# Patient Record
Sex: Female | Born: 1965 | Race: White | Hispanic: No | Marital: Married | Smoking: Never smoker
Health system: Southern US, Community
[De-identification: ages and names within clinical notes are randomized; demographics above are authoritative.]

## PROBLEM LIST (undated history)

## (undated) DIAGNOSIS — M109 Gout, unspecified: Secondary | ICD-10-CM

## (undated) DIAGNOSIS — C499 Malignant neoplasm of connective and soft tissue, unspecified: Secondary | ICD-10-CM

## (undated) DIAGNOSIS — M5430 Sciatica, unspecified side: Secondary | ICD-10-CM

## (undated) DIAGNOSIS — G8929 Other chronic pain: Secondary | ICD-10-CM

## (undated) DIAGNOSIS — E119 Type 2 diabetes mellitus without complications: Secondary | ICD-10-CM

## (undated) DIAGNOSIS — Z8669 Personal history of other diseases of the nervous system and sense organs: Secondary | ICD-10-CM

## (undated) DIAGNOSIS — D649 Anemia, unspecified: Secondary | ICD-10-CM

## (undated) DIAGNOSIS — G4730 Sleep apnea, unspecified: Secondary | ICD-10-CM

## (undated) DIAGNOSIS — O2230 Deep phlebothrombosis in pregnancy, unspecified trimester: Secondary | ICD-10-CM

## (undated) DIAGNOSIS — N200 Calculus of kidney: Secondary | ICD-10-CM

## (undated) DIAGNOSIS — Z87442 Personal history of urinary calculi: Secondary | ICD-10-CM

## (undated) DIAGNOSIS — R51 Headache: Secondary | ICD-10-CM

## (undated) DIAGNOSIS — R519 Headache, unspecified: Secondary | ICD-10-CM

## (undated) DIAGNOSIS — Z973 Presence of spectacles and contact lenses: Secondary | ICD-10-CM

## (undated) DIAGNOSIS — Z9989 Dependence on other enabling machines and devices: Secondary | ICD-10-CM

## (undated) DIAGNOSIS — F419 Anxiety disorder, unspecified: Secondary | ICD-10-CM

## (undated) DIAGNOSIS — G4733 Obstructive sleep apnea (adult) (pediatric): Secondary | ICD-10-CM

## (undated) DIAGNOSIS — O99019 Anemia complicating pregnancy, unspecified trimester: Secondary | ICD-10-CM

## (undated) DIAGNOSIS — IMO0002 Reserved for concepts with insufficient information to code with codable children: Secondary | ICD-10-CM

## (undated) DIAGNOSIS — M549 Dorsalgia, unspecified: Secondary | ICD-10-CM

## (undated) HISTORY — PX: CARDIAC CATHETERIZATION: SHX172

## (undated) HISTORY — PX: KNEE SURGERY: SHX244

## (undated) HISTORY — PX: ABDOMINAL HYSTERECTOMY: SHX81

## (undated) HISTORY — PX: ROTATOR CUFF REPAIR: SHX139

---

## 1998-06-02 ENCOUNTER — Emergency Department (HOSPITAL_COMMUNITY): Admission: EM | Admit: 1998-06-02 | Discharge: 1998-06-02 | Payer: Self-pay | Admitting: Emergency Medicine

## 1998-06-03 ENCOUNTER — Encounter: Payer: Self-pay | Admitting: Neurosurgery

## 1998-06-03 ENCOUNTER — Ambulatory Visit (HOSPITAL_COMMUNITY): Admission: RE | Admit: 1998-06-03 | Discharge: 1998-06-03 | Payer: Self-pay | Admitting: Neurosurgery

## 1998-08-03 ENCOUNTER — Ambulatory Visit (HOSPITAL_COMMUNITY): Admission: RE | Admit: 1998-08-03 | Discharge: 1998-08-03 | Payer: Self-pay | Admitting: Neurosurgery

## 1998-08-03 ENCOUNTER — Encounter: Payer: Self-pay | Admitting: Neurosurgery

## 1998-08-17 ENCOUNTER — Encounter: Payer: Self-pay | Admitting: Neurosurgery

## 1998-08-17 ENCOUNTER — Ambulatory Visit (HOSPITAL_COMMUNITY): Admission: RE | Admit: 1998-08-17 | Discharge: 1998-08-17 | Payer: Self-pay | Admitting: Neurosurgery

## 1998-08-31 ENCOUNTER — Encounter: Payer: Self-pay | Admitting: Neurosurgery

## 1998-08-31 ENCOUNTER — Ambulatory Visit (HOSPITAL_COMMUNITY): Admission: RE | Admit: 1998-08-31 | Discharge: 1998-08-31 | Payer: Self-pay | Admitting: Neurosurgery

## 1998-11-10 ENCOUNTER — Encounter: Payer: Self-pay | Admitting: Neurosurgery

## 1998-11-10 ENCOUNTER — Ambulatory Visit (HOSPITAL_COMMUNITY): Admission: RE | Admit: 1998-11-10 | Discharge: 1998-11-10 | Payer: Self-pay | Admitting: Neurosurgery

## 2004-08-09 ENCOUNTER — Ambulatory Visit: Payer: Self-pay | Admitting: General Practice

## 2005-06-15 ENCOUNTER — Ambulatory Visit: Payer: Self-pay | Admitting: Orthopaedic Surgery

## 2006-08-15 ENCOUNTER — Ambulatory Visit: Payer: Self-pay | Admitting: Endocrinology

## 2006-09-17 ENCOUNTER — Emergency Department: Payer: Self-pay | Admitting: Emergency Medicine

## 2006-12-27 ENCOUNTER — Ambulatory Visit: Payer: Self-pay | Admitting: Internal Medicine

## 2007-02-11 ENCOUNTER — Emergency Department: Payer: Self-pay | Admitting: Internal Medicine

## 2007-11-20 ENCOUNTER — Ambulatory Visit: Payer: Self-pay | Admitting: Endocrinology

## 2008-07-28 ENCOUNTER — Emergency Department: Payer: Self-pay | Admitting: Emergency Medicine

## 2009-01-19 ENCOUNTER — Ambulatory Visit: Payer: Self-pay | Admitting: Internal Medicine

## 2010-06-20 ENCOUNTER — Emergency Department: Payer: Self-pay | Admitting: Emergency Medicine

## 2010-12-14 ENCOUNTER — Ambulatory Visit: Payer: Self-pay | Admitting: Specialist

## 2011-01-19 ENCOUNTER — Ambulatory Visit: Payer: Self-pay | Admitting: Cardiology

## 2011-12-23 ENCOUNTER — Ambulatory Visit: Payer: Self-pay | Admitting: Family Medicine

## 2012-01-31 ENCOUNTER — Other Ambulatory Visit: Payer: Self-pay | Admitting: Orthopedic Surgery

## 2012-01-31 LAB — SYNOVIAL CELL COUNT + DIFF, W/ CRYSTALS
Basophil: 0 %
Lymphocytes: 15 %
Neutrophils: 78 %
Nucleated Cell Count: 3971 /mm3

## 2012-02-04 LAB — BODY FLUID CULTURE

## 2012-02-29 HISTORY — PX: BACK SURGERY: SHX140

## 2012-03-08 ENCOUNTER — Ambulatory Visit: Payer: Self-pay | Admitting: Orthopedic Surgery

## 2012-03-27 ENCOUNTER — Ambulatory Visit: Payer: Self-pay | Admitting: Orthopedic Surgery

## 2012-03-27 DIAGNOSIS — R9431 Abnormal electrocardiogram [ECG] [EKG]: Secondary | ICD-10-CM

## 2012-03-27 LAB — APTT: Activated PTT: 32.2 secs (ref 23.6–35.9)

## 2012-03-27 LAB — PROTIME-INR: Prothrombin Time: 13 secs (ref 11.5–14.7)

## 2012-04-02 ENCOUNTER — Ambulatory Visit: Payer: Self-pay | Admitting: Orthopedic Surgery

## 2012-09-28 ENCOUNTER — Ambulatory Visit: Payer: Self-pay | Admitting: Physical Medicine and Rehabilitation

## 2012-10-04 ENCOUNTER — Ambulatory Visit: Payer: Self-pay | Admitting: Pain Medicine

## 2012-10-12 ENCOUNTER — Other Ambulatory Visit: Payer: Self-pay | Admitting: Pain Medicine

## 2012-10-17 ENCOUNTER — Ambulatory Visit: Payer: Self-pay | Admitting: Pain Medicine

## 2012-10-18 ENCOUNTER — Ambulatory Visit: Payer: Self-pay | Admitting: Pain Medicine

## 2012-11-01 ENCOUNTER — Ambulatory Visit: Payer: Self-pay | Admitting: Pain Medicine

## 2012-11-26 ENCOUNTER — Ambulatory Visit: Payer: Self-pay | Admitting: Pain Medicine

## 2012-12-03 ENCOUNTER — Encounter: Payer: Self-pay | Admitting: Pain Medicine

## 2012-12-29 ENCOUNTER — Encounter: Payer: Self-pay | Admitting: Pain Medicine

## 2013-01-28 ENCOUNTER — Encounter: Payer: Self-pay | Admitting: Pain Medicine

## 2013-05-27 ENCOUNTER — Emergency Department: Payer: Self-pay | Admitting: Emergency Medicine

## 2013-08-25 ENCOUNTER — Emergency Department: Payer: Self-pay | Admitting: Emergency Medicine

## 2013-08-27 ENCOUNTER — Emergency Department: Payer: Self-pay | Admitting: Emergency Medicine

## 2013-08-27 LAB — BASIC METABOLIC PANEL
Anion Gap: 5 — ABNORMAL LOW (ref 7–16)
BUN: 11 mg/dL (ref 7–18)
CALCIUM: 8.8 mg/dL (ref 8.5–10.1)
CHLORIDE: 107 mmol/L (ref 98–107)
Co2: 26 mmol/L (ref 21–32)
Creatinine: 1.01 mg/dL (ref 0.60–1.30)
EGFR (African American): >60
EGFR (Non-African Amer.): >60
Glucose: 218 mg/dL — ABNORMAL HIGH (ref 65–99)
Osmolality: 282 (ref 275–301)
Potassium: 4.1 mmol/L (ref 3.5–5.1)
Sodium: 138 mmol/L (ref 136–145)

## 2013-08-27 LAB — CBC WITH DIFFERENTIAL/PLATELET
BASOS PCT: 0.4 %
Basophil #: 0 10*3/uL (ref 0.0–0.1)
Comment - H1-Com1: NORMAL
Comment - H1-Com2: NORMAL
EOS ABS: 0.1 10*3/uL (ref 0.0–0.7)
Eosinophil %: 1.6 %
HCT: 41.4 % (ref 35.0–47.0)
HGB: 13.5 g/dL (ref 12.0–16.0)
LYMPHS PCT: 26.8 %
Lymphocyte #: 2.3 10*3/uL (ref 1.0–3.6)
Lymphocytes: 24 %
MCH: 28.8 pg (ref 26.0–34.0)
MCHC: 32.7 g/dL (ref 32.0–36.0)
MCV: 88 fL (ref 80–100)
MONOS PCT: 5.7 %
Monocyte #: 0.5 x10 3/mm (ref 0.2–0.9)
Monocytes: 6 %
NEUTROS ABS: 5.7 10*3/uL (ref 1.4–6.5)
NEUTROS PCT: 65.5 %
Platelet: 255 10*3/uL (ref 150–440)
RBC: 4.7 10*6/uL (ref 3.80–5.20)
RDW: 13.2 % (ref 11.5–14.5)
Segmented Neutrophils: 70 %
WBC: 8.6 10*3/uL (ref 3.6–11.0)

## 2013-08-27 LAB — SEDIMENTATION RATE: ERYTHROCYTE SED RATE: 11 mm/h (ref 0–30)

## 2013-08-27 LAB — URIC ACID: Uric Acid: 9.9 mg/dL — ABNORMAL HIGH (ref 2.6–6.0)

## 2013-09-10 ENCOUNTER — Emergency Department: Payer: Self-pay | Admitting: Emergency Medicine

## 2013-09-30 ENCOUNTER — Emergency Department (HOSPITAL_COMMUNITY): Payer: Self-pay

## 2013-09-30 ENCOUNTER — Encounter (HOSPITAL_COMMUNITY): Payer: Self-pay | Admitting: Emergency Medicine

## 2013-09-30 ENCOUNTER — Emergency Department (HOSPITAL_COMMUNITY)
Admission: EM | Admit: 2013-09-30 | Discharge: 2013-09-30 | Disposition: A | Discharge status: Home / Self Care | Payer: BC Managed Care – PPO | Attending: Emergency Medicine | Admitting: Emergency Medicine

## 2013-09-30 ENCOUNTER — Emergency Department (HOSPITAL_COMMUNITY): Payer: BC Managed Care – PPO

## 2013-09-30 DIAGNOSIS — G8929 Other chronic pain: Secondary | ICD-10-CM | POA: Insufficient documentation

## 2013-09-30 DIAGNOSIS — E119 Type 2 diabetes mellitus without complications: Secondary | ICD-10-CM | POA: Insufficient documentation

## 2013-09-30 DIAGNOSIS — M7989 Other specified soft tissue disorders: Secondary | ICD-10-CM | POA: Insufficient documentation

## 2013-09-30 DIAGNOSIS — Z79899 Other long term (current) drug therapy: Secondary | ICD-10-CM | POA: Insufficient documentation

## 2013-09-30 DIAGNOSIS — M79609 Pain in unspecified limb: Secondary | ICD-10-CM | POA: Insufficient documentation

## 2013-09-30 DIAGNOSIS — M5137 Other intervertebral disc degeneration, lumbosacral region: Secondary | ICD-10-CM | POA: Insufficient documentation

## 2013-09-30 DIAGNOSIS — M51379 Other intervertebral disc degeneration, lumbosacral region without mention of lumbar back pain or lower extremity pain: Secondary | ICD-10-CM | POA: Insufficient documentation

## 2013-09-30 DIAGNOSIS — M79604 Pain in right leg: Secondary | ICD-10-CM

## 2013-09-30 DIAGNOSIS — M5136 Other intervertebral disc degeneration, lumbar region: Secondary | ICD-10-CM

## 2013-09-30 HISTORY — DX: Sciatica, unspecified side: M54.30

## 2013-09-30 HISTORY — DX: Dorsalgia, unspecified: M54.9

## 2013-09-30 HISTORY — DX: Gout, unspecified: M10.9

## 2013-09-30 HISTORY — DX: Other chronic pain: G89.29

## 2013-09-30 HISTORY — DX: Sleep apnea, unspecified: G47.30

## 2013-09-30 HISTORY — DX: Type 2 diabetes mellitus without complications: E11.9

## 2013-09-30 MED ORDER — OXYCODONE-ACETAMINOPHEN 5-325 MG PO TABS
2.0000 | ORAL_TABLET | Freq: Once | ORAL | Status: AC
  Administered 2013-09-30: 2 via ORAL
  Filled 2013-09-30: qty 2

## 2013-09-30 MED ORDER — METHOCARBAMOL 500 MG PO TABS: 1000.0000 mg | ORAL_TABLET | Freq: Four times a day (QID) | ORAL | Status: DC | PRN

## 2013-09-30 NOTE — Discharge Instructions (Signed)
°Emergency Department Resource Guide °1) Find a Doctor and Pay Out of Pocket °Although you won't have to find out who is covered by your insurance plan, it is a good idea to ask around and get recommendations. You will then need to call the office and see if the doctor you have chosen will accept you as a new patient and what types of options they offer for patients who are self-pay. Some doctors offer discounts or will set up payment plans for their patients who do not have insurance, but you will need to ask so you aren't surprised when you get to your appointment. ° °2) Contact Your Local Health Department °Not all health departments have doctors that can see patients for sick visits, but many do, so it is worth a call to see if yours does. If you don't know where your local health department is, you can check in your phone book. The CDC also has a tool to help you locate your state's health department, and many state websites also have listings of all of their local health departments. ° °3) Find a Walk-in Clinic °If your illness is not likely to be very severe or complicated, you Resetar want to try a walk in clinic. These are popping up all over the country in pharmacies, drugstores, and shopping centers. They're usually staffed by nurse practitioners or physician assistants that have been trained to treat common illnesses and complaints. They're usually fairly quick and inexpensive. However, if you have serious medical issues or chronic medical problems, these are probably not your best option. ° °No Primary Care Doctor: °- Call [facility] at  832-8000 - they can help you locate a primary care doctor that  accepts your insurance, provides certain services, etc. °- Physician Referral Service- 1-800-533-3463 ° °Chronic Pain Problems: °Organization         Address  Phone   Notes  °[facility]  ([phone]) [phone] Patients need to be referred by their primary care doctor.  ° °Medication  Assistance: °Organization         Address  Phone   Notes  °[place] [facility] [address] °[place], [place] [address] (336) 641-8030 --Must be a resident of [place] °-- Must have NO insurance coverage whatsoever (no Medicaid/ Medicare, etc.) °-- The pt. MUST have a primary care doctor that directs their care regularly and follows them in the community °  °[facility]  (866) 331-1348   °[employer]  (888) 892-1162   ° °Agencies that provide inexpensive medical care: °Organization         Address  Phone   Notes  °[facility]  (336) 832-8035   °[facility]    (336) 832-7272   °[facility] [address] °[place], [place] [address] (336) 832-4777   °[facility] [address], °[place] (336) 271-4999   °[facility]    (336) 373-0678   °[facility]    (336) 272-1050   °[facility] ° [address], [place] Phone:  (336) 832-4444, Fax:  (336) 832-4440 Hours of Operation:  9 am - 6 pm, M-F.  Also accepts Medicaid/Medicare and self-pay.  °[facility] ° [address], [place] Phone: (336) 832-3150, Fax: (336) [phone]. Hours of Operation:  8:30 am - 5:30 pm, M-F.  Also accepts Medicaid and self-pay.  °[facility] [place] [address], [place] Phone: (336) 878-6027   °[facility] [address], [place], [place] (336)723-1848, Ext. 123 Mondays & Thursdays: 7-9 AM.  First 15 patients are seen on a first come, first serve basis. °  ° °Medicaid-accepting [place] Providers: ° °Organization         Address  Phone   Notes  °[facility] [address], [place] (336) 641-2100 Also accepts self-pay patients.  °[facility] [address], [place] ° (336) 856-9996   °[facility] [address], [place]  (336) 288-8857   °[facility] [address] [place] [address], [place] (336) 299-7000   °Veita Bland [address], [place]  ° (336) 373-1557 Only accepts [place] Access Medicaid patients after they have their name applied to their card.  ° °Self-Pay (no insurance) in [place]: ° °Organization         Address  Phone   Notes  °Sickle Cell Patients, [facility] [address], [place] (336) 832-1970   °[facility] [address], [place] (336) 832-4400   °[facility] ° [address], [place] (336) 992-4800   °[facility]/Dr. Osei-Bonsu ° [address] [place] [address], [place] or [address], [place] (336) [phone] Phone number for both [place] and [place] locations is the same.  °[facility] [address], [place] (336) 299-0000   °[facility] [address] [place] [address], [place] or [address] (336) 852-7530 °(336) 878-2260   °[facility] [address], [place] (336) 350-1642, phone; (336) 294-5005, fax Sees patients 1st and 3rd Saturday of every month.  Must not qualify for public or private insurance (i.e. Medicaid, Medicare, [place] Health Choice, Veterans' Benefits) • Household income should be no more than 200% of the poverty level •The clinic cannot treat you if you are pregnant or think you are pregnant • Sexually transmitted diseases are not treated at the clinic.  ° ° °Dental Care: °Organization         Address  Phone  Notes  °[place] [facility] [address], [place] (336) 641-6152 Accepts children up to age 21 who are enrolled in Medicaid or [place] Health Choice; pregnant women with a Medicaid card; and children who have applied for Medicaid or [place] Health Choice, but were declined, whose parents can pay a reduced fee at time of service.  °[place]  [facility] [place]  [address], [place] (336) 641-7733 Accepts children up to age 21 who are enrolled in Medicaid or [place] Health Choice; pregnant women with a Medicaid card; and children who have applied for Medicaid or  Health Choice, but were declined, whose parents can pay a reduced fee at time of service.  °[facility] ° [address], [place] (336) 641-4533 Patients are seen by appointment only. Walk-ins are not accepted. [facility] will see patients 18 years of age and older. °Monday - Tuesday (8am-5pm) °Most Wednesdays (8:30-5pm) °$30 per visit, cash only  °[facility] ° [address], [place] (336) [phone] Patients are seen by appointment only. Walk-ins are not accepted. [facility] will see patients 18 years of age and older. °One   Wednesday Evening (Monthly: Volunteer Based).  $30 per visit, cash only  °[facility]  (919) [phone] for adults; Children under age 4, call Graduate Pediatric Dentistry at ([phone]) [phone]. Children aged 4-14, please call ([phone]) [phone] to request a pediatric application. ° Dental services are provided in all areas of dental care including fillings, crowns and bridges, complete and partial dentures, implants, gum treatment, root canals, and extractions. Preventive care is also provided. Treatment is provided to both adults and children. °Patients are selected via a lottery and there is often a waiting list. °  °[facility] [address], °[place] ° (336) 763-8833 [email or web address] °  °[facility] [address], [place], [place] (336)723-1848, Ext. 123 Second and Fourth Thursday of each month, opens at 6:30 AM; Clinic ends at 9 AM.  Patients are seen on a first-come first-served basis, and a limited number are seen during each clinic.  ° °[facility] ° [address], [place], [place] (336) [phone]    Eligibility Requirements °You must have lived in [place], or [place] counties for at least the last three months. °  You cannot be eligible for state or federal sponsored healthcare insurance, including Veterans Administration, Medicaid, or Medicare. °  You generally cannot be eligible for healthcare insurance through your employer.  °  How to apply: °Eligibility screenings are held every Tuesday and Wednesday afternoon from 1:00 pm until 4:00 pm. You do not need an appointment for the interview!  °[facility] [address], [place], [place] 336-631-2330   °[facility] [facility]  336-342-8273   °[facility]  336-703-3100   °[facility]  336-570-6415   ° °Behavioral Health Resources in the Community: °Intensive Outpatient Programs °Organization         Address  Phone  Notes  °[facility] [address], [place], [place] 336-878-6098   °[facility] [address], [place], [place] 336-832-9800   °[facility] [address], [place], [place] ° 336-882-2125   °[facility] [address],  °[place], [place] 1-800-853-5163 or 336-641-4981   °Substance Abuse Resources °Organization         Address  Phone  Notes  °[facility]  336-882-2125   °[facility]  336-784-9470   °[facility]  336-285-9073   °[facility]  336-845-3988   °Residential & Outpatient Substance Abuse Program  1-800-659-3381   °Psychological Services °Organization         Address  Phone  Notes  °[facility]  336- 832-9600   °[facility]  336- 378-7881   °[facility] [address], [place] 1-800-853-5163 or 336-641-4981   ° °Mobile Crisis Teams °Organization         Address  Phone  Notes  °[facility], Mobile Crisis Care Unit  1-877-626-1772   °Assertive °[facility] ° [address]  [place], [place] 336-834-9664   °Sharon DeEsch [address] °[place] 336-554-5454   ° °Self-Help/Support Groups °Organization         Address  Phone             Notes  °[facility] [place] - variety of support groups  336- 373-1402 Call for more information  °Narcotics Anonymous (NA), [facility] [address], °[place]   2 meetings at this location  ° °  Residential Treatment Programs Organization         Address  Phone  Notes  [facility] [address],    [place]  1-857-753-3820   [facility]  [address], [place] [address], [place], [place]   [place], [place] [phone] Admissions: 8am-3pm M-F  [facility] [place] [address]    [place], [place] [phone]   [facility] [address] [place], [place], [place]   [facility] [address],  [place], [place]   [facility] Intensive Outpatient [address], [place], [place]   [facility] [address]  [place], [place] 1-367-746-5419 or 470-443-0680   [facility] [address], [place], [place] Accepts Medicaid  [facility] [place] [address],  [place] 1-682-217-9461 Substance Abuse/Addiction Treatment   [facility]         Address  Phone  Notes  [facility]  915-313-0467   Domenic Schwab, PhD [address], [place]   787-423-0714 or 240-150-5183   [place], [place] [phone]   [facility] [address], [place], [place] 719-427-0370 Insurance/Medicaid/sponsorship through [facility] and Families [address]                                    [place], [place] [phone] [place] [address], [place] [phone]    Dr. Adele Schilder  [phone]   [facility] Dept. 1) [address], [place] 2) [place] 3)  [address], [place] ([phone]) [phone]  [phone]   [place] 709-873-3024 or (820)[phone] (After Hours)       Take the prescription as directed.  Apply moist heat or ice to the area(s) of discomfort, for 15 minutes at a time, several times per day for the next few days.  Do not fall asleep on a heating or ice pack.  Call your regular medical doctor or your Neurosurgeon tomorrow to schedule a follow up appointment in the next 2 days.  Return to the Emergency Department immediately if worsening.

## 2013-09-30 NOTE — ED Notes (Signed)
MD at bedside. 

## 2013-09-30 NOTE — ED Notes (Signed)
Patient c/o right leg pain with swelling x2 weeks. Patient reports calling doctor and getting prescription for Vicodin 5mg /325mg . Per patient no relief with medication. Patient reports going to [facility] this morning and PCP feels it could be a DVT.

## 2013-09-30 NOTE — ED Notes (Signed)
PT c/o back of right calf pain and swelling to leg/foot.

## 2013-09-30 NOTE — ED Provider Notes (Signed)
CSN: [id number]     Arrival date & time 09/30/13  1226 History   First MD Initiated Contact with Patient 09/30/13 1329     Chief Complaint  Patient presents with  . Leg Pain  . Leg Swelling      HPI Pt was seen at 1640. Per pt, c/o gradual onset and persistence of constant RLE "pain" for the past 2 to 3 weeks. Pain is located in her posterior right thigh and radiates down her leg. Pain worsens with palpation of the area and body position changes. States she called her Neurosurgeon Dr. Vertell Limber and was rx vicodin without improvement in her pain. Denies incont/retention of bowel or bladder, no saddle anesthesia, no focal motor weakness, no tingling/numbness in extremities, no fevers, no injury, no abd pain.  The symptoms have been associated with no other complaints.     Past Medical History  Diagnosis Date  . Diabetes mellitus without complication   . Chronic back pain   . Gout   . Sleep apnea   . Sciatic pain    Past Surgical History  Procedure Laterality Date  . Back surgery    . Knee surgery    . Cesarean section    . Abdominal hysterectomy     Family History  Problem Relation Age of Onset  . Thyroid disease Mother   . COPD Mother   . Heart attack Father   . Hypertension Brother   . Diabetes Brother    History  Substance Use Topics  . Smoking status: Never Smoker   . Smokeless tobacco: Never Used  . Alcohol Use: No   OB History   Grav Para Term Preterm Abortions TAB SAB Ect Mult Living   3 3 3       3      Review of Systems ROS: Statement: All systems negative except as marked or noted in the HPI; Constitutional: Negative for fever and chills. ; ; Eyes: Negative for eye pain, redness and discharge. ; ; ENMT: Negative for ear pain, hoarseness, nasal congestion, sinus pressure and sore throat. ; ; Cardiovascular: Negative for chest pain, palpitations, diaphoresis, dyspnea and peripheral edema. ; ; Respiratory: Negative for cough, wheezing and stridor. ; ;  Gastrointestinal: Negative for nausea, vomiting, diarrhea, abdominal pain, blood in stool, hematemesis, jaundice and rectal bleeding. . ; ; Genitourinary: Negative for dysuria, flank pain and hematuria. ; ; Musculoskeletal: +RLE pain. Negative for back pain and neck pain. Negative for swelling and trauma.; ; Skin: Negative for pruritus, rash, abrasions, blisters, bruising and skin lesion.; ; Neuro: Negative for headache, lightheadedness and neck stiffness. Negative for weakness, altered level of consciousness , altered mental status, extremity weakness, paresthesias, involuntary movement, seizure and syncope.      Allergies  Review of patient's allergies indicates no known allergies.  Home Medications   Prior to Admission medications   Medication Sig Start Date End Date Taking? Authorizing Provider  diclofenac (CATAFLAM) 50 MG tablet Take 50-75 mg by mouth 2 (two) times daily.  09/13/13 10/13/13 Yes Historical Provider, MD  glipiZIDE (GLUCOTROL) 5 MG tablet Take 5 mg by mouth every morning.   Yes Historical Provider, MD  HYDROcodone-acetaminophen (NORCO/VICODIN) 5-325 MG per tablet Take 1 tablet by mouth every 6 (six) hours as needed for moderate pain.   Yes Historical Provider, MD  metFORMIN (GLUCOPHAGE) 500 MG tablet Take 500 mg by mouth 2 (two) times daily.   Yes Historical Provider, MD   BP 97/55  Pulse 67  Temp(Src) 97.7 F (  36.5 C) (Oral)  Resp 16  Ht 5\' 1"  (1.549 m)  Wt 243 lb (110.224 kg)  BMI 45.94 kg/m2  SpO2 94% Physical Exam 1645: Physical examination:  Nursing notes reviewed; Vital signs and O2 SAT reviewed;  Constitutional: Well developed, Well nourished, Well hydrated, In no acute distress; Head:  Normocephalic, atraumatic; Eyes: EOMI, PERRL, No scleral icterus; ENMT: Mouth and pharynx normal, Mucous membranes moist; Neck: Supple, Full range of motion, No lymphadenopathy; Cardiovascular: Regular rate and rhythm, No murmur, rub, or gallop; Respiratory: Breath sounds clear &  equal bilaterally, No rales, rhonchi, wheezes.  Speaking full sentences with ease, Normal respiratory effort/excursion; Chest: Nontender, Movement normal; Abdomen: Soft, Nontender, Nondistended, Normal bowel sounds; Genitourinary: No CVA tenderness; Spine:  No midline CS, TS, LS tenderness. +mild TTP right lumbar paraspinal muscles.;; Extremities: Pulses normal, NMS intact right foot. NT right hip/knee/ankle/foot. +mild TTP right hamstrings. Muscles compartments soft. No deformity. No rash, no erythema, no ecchymosis. No edema, No calf edema or asymmetry.; Neuro: AA&Ox3, Major CN grossly intact.  Speech clear. No gross focal motor or sensory deficits in extremities. Strength 5/5 equal bilat UE's and LE's, including great toe dorsiflexion.  DTR 2/4 equal bilat UE's and LE's.  No gross sensory deficits.  Neg straight leg raises bilat.; Skin: Color normal, Warm, Dry.   ED Course  Procedures    MDM  MDM Reviewed: previous chart, nursing note and vitals Reviewed previous: MRI and CT scan Interpretation: x-ray and ultrasound    Dg Lumbar Spine Complete 09/30/2013   CLINICAL DATA:  Low back pain radiating to right hip for 2 weeks, no injury.  EXAM: LUMBAR SPINE - COMPLETE 4+ VIEW  COMPARISON:  Lumbar spine radiographs January 09, 2013  FINDINGS: Lumbar vertebral bodies appear intact and aligned with maintenance of the lumbar lordosis. Mild chronic T12 wedging with ventral endplate spurring. Mild L5-S1 disc degeneration. Remaining intervertebral disc heights preserved. No pars interarticularis defects. No destructive bony lesions. Mild L5-S1 facet arthropathy. Sacroiliac joints are symmetric. Subcentimeter calcification projecting in lower pole right kidney. Subcentimeter density projecting left mid abdomen Mahaffy be enteric. Phleboliths in the left pelvis.  IMPRESSION: No acute fracture deformity or malalignment. Similar lumbar spondylosis.  Possible right nephrolithiasis.   Electronically Signed   By: Elon Alas   On: 09/30/2013 17:55   Dg Hip Complete Right 09/30/2013   CLINICAL DATA:  Low back pain radiating to the right hip  EXAM: RIGHT HIP - COMPLETE 2+ VIEW  COMPARISON:  None.  FINDINGS: There is no evidence of hip fracture or dislocation. There is no evidence of arthropathy or other focal bone abnormality.  IMPRESSION: Negative.   Electronically Signed   By: Conchita Paris M.D.   On: 09/30/2013 18:01   US Venous Img Lower Unilateral Right 09/30/2013   CLINICAL DATA:  Right lower extremity pain and swelling  EXAM: RIGHT LOWER EXTREMITY VENOUS DOPPLER ULTRASOUND  TECHNIQUE: Gray-scale sonography with graded compression, as well as color Doppler and duplex ultrasound were performed to evaluate the lower extremity deep venous systems from the level of the common femoral vein and including the common femoral, femoral, profunda femoral, popliteal and calf veins including the posterior tibial, peroneal and gastrocnemius veins when visible. The superficial great saphenous vein was also interrogated. Spectral Doppler was utilized to evaluate flow at rest and with distal augmentation maneuvers in the common femoral, femoral and popliteal veins.  COMPARISON:  None.  FINDINGS: Common Femoral Vein: No evidence of thrombus. Normal compressibility, respiratory phasicity and response  to augmentation.  Saphenofemoral Junction: No evidence of thrombus. Normal compressibility and flow on color Doppler imaging.  Profunda Femoral Vein: No evidence of thrombus. Normal compressibility and flow on color Doppler imaging.  Femoral Vein: No evidence of thrombus. Normal compressibility, respiratory phasicity and response to augmentation.  Popliteal Vein: No evidence of thrombus. Normal compressibility, respiratory phasicity and response to augmentation.  Calf Veins: No evidence of thrombus. Normal compressibility and flow on color Doppler imaging.  Superficial Great Saphenous Vein: No evidence of thrombus. Normal compressibility and  flow on color Doppler imaging.  Venous Reflux:  None.  Other Findings: Edema in the superficial subcutaneous soft tissues of the lower leg in the medial aspect of the ankle.  IMPRESSION: No evidence of deep venous thrombosis.   Electronically Signed   By: Jacqulynn Cadet M.D.   On: 09/30/2013 14:53    1845:  Workup reassuring. Pt requesting rx for oxycodone. [place] Controlled Substance Database accessed: pt has received 4 different narcotic prescriptions written by 4 different providers in the past 5 weeks, with the most recent rx filled on 09/25/13. Informed pt of this information and that I will rx another narcotic pain medication at this time. Pt verb understanding. States she is ready to go home now. Dx and testing d/w pt and family.  Questions answered.  Verb understanding, agreeable to d/c home with outpt f/u.   Francine Graven, DO 10/03/13 [phone]

## 2013-10-15 ENCOUNTER — Other Ambulatory Visit (HOSPITAL_COMMUNITY): Payer: Self-pay | Admitting: Neurosurgery

## 2013-10-15 DIAGNOSIS — IMO0002 Reserved for concepts with insufficient information to code with codable children: Secondary | ICD-10-CM

## 2013-10-23 ENCOUNTER — Ambulatory Visit (HOSPITAL_COMMUNITY)
Admission: RE | Admit: 2013-10-23 | Discharge: 2013-10-23 | Disposition: A | Discharge status: Home / Self Care | Payer: BC Managed Care – PPO | Source: Ambulatory Visit | Attending: Neurosurgery | Admitting: Neurosurgery

## 2013-10-23 DIAGNOSIS — M5126 Other intervertebral disc displacement, lumbar region: Secondary | ICD-10-CM | POA: Insufficient documentation

## 2013-10-23 DIAGNOSIS — M539 Dorsopathy, unspecified: Secondary | ICD-10-CM | POA: Insufficient documentation

## 2013-10-23 DIAGNOSIS — IMO0002 Reserved for concepts with insufficient information to code with codable children: Secondary | ICD-10-CM

## 2013-10-23 LAB — POCT I-STAT CREATININE: CREATININE: 0.8 mg/dL (ref 0.50–1.10)

## 2013-10-23 MED ORDER — GADOBENATE DIMEGLUMINE 529 MG/ML IV SOLN
20.0000 mL | Freq: Once | INTRAVENOUS | Status: AC | PRN
  Administered 2013-10-23: 20 mL via INTRAVENOUS

## 2013-10-30 ENCOUNTER — Ambulatory Visit (HOSPITAL_COMMUNITY): Payer: BC Managed Care – PPO

## 2013-11-22 ENCOUNTER — Other Ambulatory Visit: Payer: Self-pay | Admitting: Neurosurgery

## 2013-11-29 ENCOUNTER — Encounter (HOSPITAL_COMMUNITY): Payer: Self-pay | Admitting: Pharmacy Technician

## 2013-12-03 NOTE — Pre-Procedure Instructions (Signed)
Tynika G Archibald  12/03/2013   Your procedure is scheduled on:  Tuesday, October 13th  Report to [facility] Admitting at 8 AM.  Call this number if you have problems the morning of surgery: [phone]   Remember:   Do not eat food or drink liquids after midnight.   Take these medicines the morning of surgery with A SIP OF WATER: pain medication if needed   Do not wear jewelry, make-up or nail polish.  Do not wear lotions, powders, or perfumes. You Long wear deodorant.  Do not shave 48 hours prior to surgery. Men Olver shave face and neck.  Do not bring valuables to the hospital.  [facility] is not responsible  for any belongings or valuables.               Contacts, dentures or bridgework Lechtenberg not be worn into surgery.  Leave suitcase in the car. After surgery it Mchaffie be brought to your room.  For patients admitted to the hospital, discharge time is determined by your treatment team.               Patients discharged the day of surgery will not be allowed to drive home.  Please read over the following fact sheets that you were given: Pain Booklet, Coughing and Deep Breathing, MRSA Information and Surgical Site Infection Prevention [place] - Preparing for Surgery  Before surgery, you can play an important role.  Because skin is not sterile, your skin needs to be as free of germs as possible.  You can reduce the number of germs on you skin by washing with CHG (chlorahexidine gluconate) soap before surgery.  CHG is an antiseptic cleaner which kills germs and bonds with the skin to continue killing germs even after washing.  Please DO NOT use if you have an allergy to CHG or antibacterial soaps.  If your skin becomes reddened/irritated stop using the CHG and inform your nurse when you arrive at Short Stay.  Do not shave (including legs and underarms) for at least 48 hours prior to the first CHG shower.  You Pesch shave your face.  Please follow these instructions carefully:   1.   Shower with CHG Soap the night before surgery and the morning of Surgery.  2.  If you choose to wash your hair, wash your hair first as usual with your normal shampoo.  3.  After you shampoo, rinse your hair and body thoroughly to remove the shampoo.  4.  Use CHG as you would any other liquid soap.  You can apply CHG directly to the skin and wash gently with scrungie or a clean washcloth.  5.  Apply the CHG Soap to your body ONLY FROM THE NECK DOWN.  Do not use on open wounds or open sores.  Avoid contact with your eyes, ears, mouth and genitals (private parts).  Wash genitals (private parts) with your normal soap.  6.  Wash thoroughly, paying special attention to the area where your surgery will be performed.  7.  Thoroughly rinse your body with warm water from the neck down.  8.  DO NOT shower/wash with your normal soap after using and rinsing off the CHG Soap.  9.  Pat yourself dry with a clean towel.            10.  Wear clean pajamas.            11.  Place clean sheets on your bed the night  of your first shower and do not sleep with pets.  Day of Surgery  Do not apply any lotions/deoderants the morning of surgery.  Please wear clean clothes to the hospital/surgery center.

## 2013-12-04 ENCOUNTER — Encounter (HOSPITAL_COMMUNITY): Payer: Self-pay

## 2013-12-04 ENCOUNTER — Encounter (HOSPITAL_COMMUNITY)
Admission: RE | Admit: 2013-12-04 | Discharge: 2013-12-04 | Disposition: A | Discharge status: Home / Self Care | Payer: Medicaid Other | Source: Ambulatory Visit | Attending: Neurosurgery | Admitting: Neurosurgery

## 2013-12-04 DIAGNOSIS — M5116 Intervertebral disc disorders with radiculopathy, lumbar region: Secondary | ICD-10-CM | POA: Insufficient documentation

## 2013-12-04 DIAGNOSIS — Z86718 Personal history of other venous thrombosis and embolism: Secondary | ICD-10-CM | POA: Diagnosis not present

## 2013-12-04 DIAGNOSIS — E119 Type 2 diabetes mellitus without complications: Secondary | ICD-10-CM | POA: Insufficient documentation

## 2013-12-04 DIAGNOSIS — Z6841 Body Mass Index (BMI) 40.0 and over, adult: Secondary | ICD-10-CM | POA: Diagnosis not present

## 2013-12-04 DIAGNOSIS — G4733 Obstructive sleep apnea (adult) (pediatric): Secondary | ICD-10-CM | POA: Diagnosis not present

## 2013-12-04 DIAGNOSIS — G43909 Migraine, unspecified, not intractable, without status migrainosus: Secondary | ICD-10-CM | POA: Diagnosis not present

## 2013-12-04 DIAGNOSIS — Z0000 Encounter for general adult medical examination without abnormal findings: Secondary | ICD-10-CM | POA: Diagnosis not present

## 2013-12-04 DIAGNOSIS — I083 Combined rheumatic disorders of mitral, aortic and tricuspid valves: Secondary | ICD-10-CM | POA: Diagnosis not present

## 2013-12-04 DIAGNOSIS — M109 Gout, unspecified: Secondary | ICD-10-CM | POA: Insufficient documentation

## 2013-12-04 HISTORY — DX: Anemia, unspecified: D64.9

## 2013-12-04 HISTORY — DX: Headache: R51

## 2013-12-04 HISTORY — DX: Deep phlebothrombosis in pregnancy, unspecified trimester: O22.30

## 2013-12-04 HISTORY — DX: Calculus of kidney: N20.0

## 2013-12-04 HISTORY — DX: Headache, unspecified: R51.9

## 2013-12-04 LAB — BASIC METABOLIC PANEL
ANION GAP: 14 (ref 5–15)
BUN: 9 mg/dL (ref 6–23)
CO2: 24 meq/L (ref 19–32)
CREATININE: 0.57 mg/dL (ref 0.50–1.10)
Calcium: 9.1 mg/dL (ref 8.4–10.5)
Chloride: 100 mEq/L (ref 96–112)
GFR calc Af Amer: >90 mL/min (ref >90)
Glucose, Bld: 186 mg/dL — ABNORMAL HIGH (ref 70–99)
Potassium: 4.2 mEq/L (ref 3.7–5.3)
SODIUM: 138 meq/L (ref 137–147)

## 2013-12-04 LAB — CBC
HCT: 40.5 % (ref 36.0–46.0)
Hemoglobin: 13.5 g/dL (ref 12.0–15.0)
MCH: 28.9 pg (ref 26.0–34.0)
MCHC: 33.3 g/dL (ref 30.0–36.0)
MCV: 86.7 fL (ref 78.0–100.0)
PLATELETS: 239 10*3/uL (ref 150–400)
RBC: 4.67 MIL/uL (ref 3.87–5.11)
RDW: 13.4 % (ref 11.5–15.5)
WBC: 8.5 10*3/uL (ref 4.0–10.5)

## 2013-12-04 LAB — SURGICAL PCR SCREEN
MRSA, PCR: NEGATIVE
Staphylococcus aureus: NEGATIVE

## 2013-12-04 NOTE — Progress Notes (Addendum)
Pt states that several years ago she had to have a cardiac cath done because her PCP "thought he heard something wrong" in her heart. States she had a stress test that she failed and then they did the cath. She states the cath was clean and she's never had any other issues with her heart.   Requesting stress test and cath results from [facility].   Pt's PCP is Dr. Lisette Grinder with [facility] in [place]. She saw Dr. Saralyn Pilar (cardiologist) when she had the cath.  Have also requested last OV notes from Dr. Saralyn Pilar and EKG from Dr. Dreama Saa office.

## 2013-12-04 NOTE — Pre-Procedure Instructions (Signed)
Pamela Torres  12/04/2013   Your procedure is scheduled on:  Tuesday, December 10, 2013 at 11:00 AM.   Report to [facility] Entrance "A" Admitting Office at 8:00 AM.   Call this number if you have problems the morning of surgery: [phone]   Remember:   Do not eat food or drink liquids after midnight Monday, 12/09/13.   Take these medicines the morning of surgery with A SIP OF WATER: oxyCODONE-acetaminophen (PERCOCET/ROXICET) - if needed  Do not take your diabetic medications the morning of surgery.  Stop Aleve as of today.    Do not wear jewelry, make-up or nail polish.  Do not wear lotions, powders, or perfumes. You Notarianni wear deodorant.  Do not shave 48 hours prior to surgery. .  Do not bring valuables to the hospital.  [facility] is not responsible                  for any belongings or valuables.               Contacts, dentures or bridgework Carchi not be worn into surgery.  Leave suitcase in the car. After surgery it Worm be brought to your room.  For patients admitted to the hospital, discharge time is determined by your                treatment team.               Patients discharged the day of surgery will not be allowed to drive home.    Special Instructions: [place] - Preparing for Surgery  Before surgery, you can play an important role.  Because skin is not sterile, your skin needs to be as free of germs as possible.  You can reduce the number of germs on you skin by washing with CHG (chlorahexidine gluconate) soap before surgery.  CHG is an antiseptic cleaner which kills germs and bonds with the skin to continue killing germs even after washing.  Please DO NOT use if you have an allergy to CHG or antibacterial soaps.  If your skin becomes reddened/irritated stop using the CHG and inform your nurse when you arrive at Short Stay.  Do not shave (including legs and underarms) for at least 48 hours prior to the first CHG shower.  You Hickam shave your  face.  Please follow these instructions carefully:   1.  Shower with CHG Soap the night before surgery and the                                morning of Surgery.  2.  If you choose to wash your hair, wash your hair first as usual with your       normal shampoo.  3.  After you shampoo, rinse your hair and body thoroughly to remove the                      Shampoo.  4.  Use CHG as you would any other liquid soap.  You can apply chg directly       to the skin and wash gently with scrungie or a clean washcloth.  5.  Apply the CHG Soap to your body ONLY FROM THE NECK DOWN.        Do not use on open wounds or open sores.  Avoid contact with your eyes, ears, mouth and genitals (private parts).  Wash genitals (private parts) with your normal soap.  6.  Wash thoroughly, paying special attention to the area where your surgery        will be performed.  7.  Thoroughly rinse your body with warm water from the neck down.  8.  DO NOT shower/wash with your normal soap after using and rinsing off       the CHG Soap.  9.  Pat yourself dry with a clean towel.            10.  Wear clean pajamas.            11.  Place clean sheets on your bed the night of your first shower and do not        sleep with pets.  Day of Surgery  Do not apply any lotions the morning of surgery.  Please wear clean clothes to the hospital/surgery center.     Please read over the following fact sheets that you were given: Pain Booklet, Coughing and Deep Breathing, MRSA Information and Surgical Site Infection Prevention

## 2013-12-05 ENCOUNTER — Encounter (HOSPITAL_COMMUNITY): Payer: Self-pay

## 2013-12-05 NOTE — Progress Notes (Signed)
Anesthesia Chart Review:  Patient is a 48 year old female scheduled for right L5-S1 redo microdiskectomy on 12/10/13 by Dr. Vertell Limber.  History includes non-smoker, DM2, RLE DVT in pregnancy, OSA with CPAP use, chronic back pain, anemia of pregnancy, migraine headaches, gout, nephrolithiasis, normal coronaries by 2012 cath (Dr. Isaias Cowman). BMI is consistent with morbid obesity. PCP is listed as Dr. Lisette Grinder with [facility].  EKG on 12/04/13 showed: NSR, possible LAE, T wave abnormality, consider anterior ischemia, prolonged QT. [facility] did not have an old EKG on file although Dr. Saralyn Pilar' 01/10/11 office note states she had an abnormal EKG revealing evidence of possible old anteroseptal MI and that she had an abnormal ETT Sestamibi study showing anterior ischemia.  (Since his office does not have an EKG on file, I think he must be referring to tracings done during her stress test.)  [facility] and Dr. Thomes Dinning offices were both contacted for old EKGs, but neither of these places have an EKG on file.  [facility] and [facility] do not have a prior EKG on file either. Per her PAT RN, patient denied any cardiac issues since her 2012 cath.  01/05/11 ETT Sestamibi study report stated: Normal treadmill ECG without evidence of ischemia or dysrhythmia, average exercise tolerance for age nomrla LVF with EF 59%, abnormal myocardial perfusion images consistent with myocardial ischemia. Subsequently, patient had a cardiac cath [facility]) on 01/19/11 that showed: normal coronary anatomy, EF 64%.  Echo performed on 01/05/11 showed normal LV systolic function with moderate LVH, mild LAE, EF > 55%. Trace aortic, mitral, pulmonic, tricuspid insufficiency.       Preoperative labs noted.  Cr 0.57. Glucose 186.  CBC WNL.  Patient had consider anterior ischemia on EKG. I have not been able to locate any comparison EKG; however, she had normal coronaries and EF by cath within the past three years.  No reported CV symptoms  at PAT.  She will be further evaluated by her assigned anesthesiologist on the day of surgery, but if no acute changes then I would anticipate that she could proceed as planned.    George Hugh [facility] Short Stay Center/Anesthesiology Phone [phone] 12/05/2013 3:07 PM

## 2013-12-09 MED ORDER — CEFAZOLIN SODIUM-DEXTROSE 2-3 GM-% IV SOLR
2.0000 g | INTRAVENOUS | Status: AC
  Administered 2013-12-10: 2 g via INTRAVENOUS
  Filled 2013-12-09: qty 50

## 2013-12-09 NOTE — Discharge Instructions (Signed)
Wound Care Leave incision open to air. You Albaugh shower. Do not scrub directly on incision.  Do not put any creams, lotions, or ointments on incision. Activity Walk each and every day, increasing distance each day. No lifting greater than 5 lbs.  Avoid bending, arching, and twisting. No driving for 2 weeks; [place] ride as a passenger locally. If provided with back brace, wear when out of bed.  It is not necessary to wear in bed. Diet Resume your normal diet.  Return to Work Will be discussed at you follow up appointment. Call Your Doctor If Any of These Occur Redness, drainage, or swelling at the wound.  Temperature greater than 101 degrees. Severe pain not relieved by pain medication. Incision starts to come apart. Follow Up Appt Call today for appointment in 3-4 weeks ([phone]) or for problems.  If you have any hardware placed in your spine, you will need an x-ray before your appointment.  

## 2013-12-10 ENCOUNTER — Ambulatory Visit (HOSPITAL_COMMUNITY): Payer: Medicaid Other

## 2013-12-10 ENCOUNTER — Encounter (HOSPITAL_COMMUNITY): Payer: Self-pay | Admitting: Surgery

## 2013-12-10 ENCOUNTER — Encounter (HOSPITAL_COMMUNITY): Payer: Medicaid Other | Admitting: Vascular Surgery

## 2013-12-10 ENCOUNTER — Ambulatory Visit (HOSPITAL_COMMUNITY): Payer: Medicaid Other | Admitting: Anesthesiology

## 2013-12-10 ENCOUNTER — Encounter (HOSPITAL_COMMUNITY)
Admission: RE | Disposition: A | Discharge status: Home / Self Care | Payer: Self-pay | Source: Ambulatory Visit | Attending: Neurosurgery

## 2013-12-10 ENCOUNTER — Ambulatory Visit (HOSPITAL_COMMUNITY)
Admission: RE | Admit: 2013-12-10 | Discharge: 2013-12-11 | Disposition: A | Discharge status: Home / Self Care | Payer: Medicaid Other | Source: Ambulatory Visit | Attending: Neurosurgery | Admitting: Neurosurgery

## 2013-12-10 DIAGNOSIS — M5116 Intervertebral disc disorders with radiculopathy, lumbar region: Secondary | ICD-10-CM | POA: Insufficient documentation

## 2013-12-10 DIAGNOSIS — M5127 Other intervertebral disc displacement, lumbosacral region: Secondary | ICD-10-CM

## 2013-12-10 DIAGNOSIS — M5126 Other intervertebral disc displacement, lumbar region: Secondary | ICD-10-CM | POA: Diagnosis present

## 2013-12-10 HISTORY — PX: LUMBAR LAMINECTOMY/DECOMPRESSION MICRODISCECTOMY: SHX5026

## 2013-12-10 LAB — GLUCOSE, CAPILLARY
GLUCOSE-CAPILLARY: 137 mg/dL — AB (ref 70–99)
GLUCOSE-CAPILLARY: 217 mg/dL — AB (ref 70–99)
Glucose-Capillary: 229 mg/dL — ABNORMAL HIGH (ref 70–99)
Glucose-Capillary: 275 mg/dL — ABNORMAL HIGH (ref 70–99)

## 2013-12-10 SURGERY — LUMBAR LAMINECTOMY/DECOMPRESSION MICRODISCECTOMY 1 LEVEL
Anesthesia: General | Laterality: Right

## 2013-12-10 MED ORDER — PNEUMOCOCCAL VAC POLYVALENT 25 MCG/0.5ML IJ INJ
0.5000 mL | INJECTION | INTRAMUSCULAR | Status: AC
  Administered 2013-12-11: 0.5 mL via INTRAMUSCULAR
  Filled 2013-12-10 (×2): qty 0.5

## 2013-12-10 MED ORDER — GLIPIZIDE 5 MG PO TABS
5.0000 mg | ORAL_TABLET | Freq: Every day | ORAL | Status: DC
  Filled 2013-12-10 (×2): qty 1

## 2013-12-10 MED ORDER — DIAZEPAM 5 MG PO TABS: 5.0000 mg | ORAL_TABLET | Freq: Four times a day (QID) | ORAL | Status: DC | PRN

## 2013-12-10 MED ORDER — SODIUM CHLORIDE 0.9 % IJ SOLN
3.0000 mL | Freq: Two times a day (BID) | INTRAMUSCULAR | Status: DC
  Administered 2013-12-10 (×2): 3 mL via INTRAVENOUS

## 2013-12-10 MED ORDER — BUPIVACAINE HCL (PF) 0.5 % IJ SOLN
INTRAMUSCULAR | Status: DC | PRN
  Administered 2013-12-10: 10 mL

## 2013-12-10 MED ORDER — OXYCODONE-ACETAMINOPHEN 5-325 MG PO TABS
1.0000 | ORAL_TABLET | ORAL | Status: DC | PRN
  Administered 2013-12-10: 2 via ORAL
  Administered 2013-12-10: 1 via ORAL
  Administered 2013-12-11 (×2): 2 via ORAL
  Filled 2013-12-10 (×2): qty 2
  Filled 2013-12-10: qty 1
  Filled 2013-12-10: qty 2

## 2013-12-10 MED ORDER — FLEET ENEMA 7-19 GM/118ML RE ENEM
1.0000 | ENEMA | Freq: Once | RECTAL | Status: AC | PRN
  Filled 2013-12-10: qty 1

## 2013-12-10 MED ORDER — LACTATED RINGERS IV SOLN
INTRAVENOUS | Status: DC
  Administered 2013-12-10: 09:00:00 via INTRAVENOUS

## 2013-12-10 MED ORDER — MENTHOL 3 MG MT LOZG: 1.0000 | LOZENGE | OROMUCOSAL | Status: DC | PRN

## 2013-12-10 MED ORDER — LACTATED RINGERS IV SOLN
INTRAVENOUS | Status: DC | PRN
  Administered 2013-12-10 (×2): via INTRAVENOUS

## 2013-12-10 MED ORDER — INSULIN ASPART 100 UNIT/ML ~~LOC~~ SOLN
4.0000 [IU] | Freq: Three times a day (TID) | SUBCUTANEOUS | Status: DC
  Filled 2013-12-10 (×25): qty 0.04

## 2013-12-10 MED ORDER — MIDAZOLAM HCL 2 MG/2ML IJ SOLN
INTRAMUSCULAR | Status: AC
Start: 2013-12-10 — End: 2013-12-10
  Filled 2013-12-10: qty 2

## 2013-12-10 MED ORDER — SENNA 8.6 MG PO TABS
1.0000 | ORAL_TABLET | Freq: Two times a day (BID) | ORAL | Status: DC
  Administered 2013-12-10: 8.6 mg via ORAL
  Filled 2013-12-10 (×3): qty 1

## 2013-12-10 MED ORDER — ACETAMINOPHEN 325 MG PO TABS: 650.0000 mg | ORAL_TABLET | ORAL | Status: DC | PRN

## 2013-12-10 MED ORDER — THROMBIN 5000 UNITS EX SOLR
CUTANEOUS | Status: DC | PRN
  Administered 2013-12-10 (×2): 5000 [IU] via TOPICAL

## 2013-12-10 MED ORDER — INFLUENZA VAC SPLIT QUAD 0.5 ML IM SUSY
0.5000 mL | PREFILLED_SYRINGE | INTRAMUSCULAR | Status: AC
  Administered 2013-12-11: 0.5 mL via INTRAMUSCULAR
  Filled 2013-12-10 (×2): qty 0.5

## 2013-12-10 MED ORDER — GLYCOPYRROLATE 0.2 MG/ML IJ SOLN
INTRAMUSCULAR | Status: DC | PRN
  Administered 2013-12-10: 0.4 mg via INTRAVENOUS

## 2013-12-10 MED ORDER — FENTANYL CITRATE 0.05 MG/ML IJ SOLN
INTRAMUSCULAR | Status: AC
  Filled 2013-12-10: qty 2

## 2013-12-10 MED ORDER — HEMOSTATIC AGENTS (NO CHARGE) OPTIME
TOPICAL | Status: DC | PRN
  Administered 2013-12-10: 1 via TOPICAL

## 2013-12-10 MED ORDER — ROCURONIUM BROMIDE 100 MG/10ML IV SOLN
INTRAVENOUS | Status: DC | PRN
  Administered 2013-12-10: 50 mg via INTRAVENOUS

## 2013-12-10 MED ORDER — ONDANSETRON HCL 4 MG/2ML IJ SOLN
INTRAMUSCULAR | Status: AC
Start: 2013-12-10 — End: 2013-12-10
  Filled 2013-12-10: qty 2

## 2013-12-10 MED ORDER — PROPOFOL 10 MG/ML IV BOLUS
INTRAVENOUS | Status: DC | PRN
  Administered 2013-12-10: 200 mg via INTRAVENOUS

## 2013-12-10 MED ORDER — OXYCODONE HCL 5 MG PO TABS: 5.0000 mg | ORAL_TABLET | Freq: Once | ORAL | Status: DC | PRN

## 2013-12-10 MED ORDER — ONDANSETRON HCL 4 MG/2ML IJ SOLN
INTRAMUSCULAR | Status: DC | PRN
  Administered 2013-12-10: 4 mg via INTRAVENOUS

## 2013-12-10 MED ORDER — PROMETHAZINE HCL 25 MG/ML IJ SOLN
INTRAMUSCULAR | Status: AC
  Filled 2013-12-10: qty 1

## 2013-12-10 MED ORDER — FENTANYL CITRATE 0.05 MG/ML IJ SOLN
INTRAMUSCULAR | Status: DC | PRN
  Administered 2013-12-10: 50 ug via INTRAVENOUS

## 2013-12-10 MED ORDER — PHENOL 1.4 % MT LIQD: 1.0000 | OROMUCOSAL | Status: DC | PRN

## 2013-12-10 MED ORDER — FENTANYL CITRATE 0.05 MG/ML IJ SOLN
INTRAMUSCULAR | Status: AC
  Filled 2013-12-10: qty 5

## 2013-12-10 MED ORDER — ALUM & MAG HYDROXIDE-SIMETH 200-200-20 MG/5ML PO SUSP: 30.0000 mL | Freq: Four times a day (QID) | ORAL | Status: DC | PRN

## 2013-12-10 MED ORDER — SODIUM CHLORIDE 0.9 % IJ SOLN: 3.0000 mL | INTRAMUSCULAR | Status: DC | PRN

## 2013-12-10 MED ORDER — NAPROXEN 250 MG PO TABS
250.0000 mg | ORAL_TABLET | Freq: Two times a day (BID) | ORAL | Status: DC | PRN
  Filled 2013-12-10: qty 1

## 2013-12-10 MED ORDER — MIDAZOLAM HCL 5 MG/5ML IJ SOLN
INTRAMUSCULAR | Status: DC | PRN
  Administered 2013-12-10: 2 mg via INTRAVENOUS

## 2013-12-10 MED ORDER — NEOSTIGMINE METHYLSULFATE 10 MG/10ML IV SOLN
INTRAVENOUS | Status: DC | PRN
Start: 2013-12-10 — End: 2013-12-10
  Administered 2013-12-10: 3 mg via INTRAVENOUS

## 2013-12-10 MED ORDER — LIDOCAINE HCL (CARDIAC) 20 MG/ML IV SOLN
INTRAVENOUS | Status: AC
  Filled 2013-12-10: qty 5

## 2013-12-10 MED ORDER — METFORMIN HCL 500 MG PO TABS
500.0000 mg | ORAL_TABLET | Freq: Two times a day (BID) | ORAL | Status: DC
  Administered 2013-12-10: 500 mg via ORAL
  Filled 2013-12-10 (×4): qty 1

## 2013-12-10 MED ORDER — INSULIN ASPART 100 UNIT/ML ~~LOC~~ SOLN
0.0000 [IU] | Freq: Three times a day (TID) | SUBCUTANEOUS | Status: DC
  Filled 2013-12-10 (×25): qty 0.15

## 2013-12-10 MED ORDER — METHYLPREDNISOLONE ACETATE 80 MG/ML IJ SUSP
INTRAMUSCULAR | Status: DC | PRN
  Administered 2013-12-10: 80 mg

## 2013-12-10 MED ORDER — ACETAMINOPHEN 650 MG RE SUPP: 650.0000 mg | RECTAL | Status: DC | PRN

## 2013-12-10 MED ORDER — CEFAZOLIN SODIUM 1-5 GM-% IV SOLN
1.0000 g | Freq: Three times a day (TID) | INTRAVENOUS | Status: AC
  Administered 2013-12-10 – 2013-12-11 (×2): 1 g via INTRAVENOUS
  Filled 2013-12-10 (×2): qty 50

## 2013-12-10 MED ORDER — METHOCARBAMOL 500 MG PO TABS
500.0000 mg | ORAL_TABLET | Freq: Three times a day (TID) | ORAL | Status: DC
  Administered 2013-12-10 (×2): 500 mg via ORAL
  Filled 2013-12-10 (×5): qty 1

## 2013-12-10 MED ORDER — OXYCODONE HCL 5 MG/5ML PO SOLN: 5.0000 mg | Freq: Once | ORAL | Status: DC | PRN

## 2013-12-10 MED ORDER — ZOLPIDEM TARTRATE 5 MG PO TABS: 5.0000 mg | ORAL_TABLET | Freq: Every evening | ORAL | Status: DC | PRN

## 2013-12-10 MED ORDER — PROPOFOL 10 MG/ML IV BOLUS
INTRAVENOUS | Status: AC
  Filled 2013-12-10: qty 20

## 2013-12-10 MED ORDER — ACETAMINOPHEN 10 MG/ML IV SOLN
INTRAVENOUS | Status: AC
  Administered 2013-12-10: 1000 mg via INTRAVENOUS
  Filled 2013-12-10: qty 100

## 2013-12-10 MED ORDER — LIDOCAINE-EPINEPHRINE 1 %-1:100000 IJ SOLN
INTRAMUSCULAR | Status: DC | PRN
  Administered 2013-12-10: 10 mL

## 2013-12-10 MED ORDER — FENTANYL CITRATE 0.05 MG/ML IJ SOLN
INTRAMUSCULAR | Status: DC | PRN
  Administered 2013-12-10 (×2): 50 ug via INTRAVENOUS
  Administered 2013-12-10: 100 ug via INTRAVENOUS
  Administered 2013-12-10: 50 ug via INTRAVENOUS

## 2013-12-10 MED ORDER — HYDROMORPHONE HCL 1 MG/ML IJ SOLN
0.2500 mg | INTRAMUSCULAR | Status: DC | PRN
  Administered 2013-12-10: 0.5 mg via INTRAVENOUS

## 2013-12-10 MED ORDER — BISACODYL 10 MG RE SUPP: 10.0000 mg | Freq: Every day | RECTAL | Status: DC | PRN

## 2013-12-10 MED ORDER — LIDOCAINE HCL (CARDIAC) 20 MG/ML IV SOLN
INTRAVENOUS | Status: DC | PRN
  Administered 2013-12-10: 70 mg via INTRAVENOUS

## 2013-12-10 MED ORDER — DOCUSATE SODIUM 100 MG PO CAPS
100.0000 mg | ORAL_CAPSULE | Freq: Two times a day (BID) | ORAL | Status: DC
  Administered 2013-12-10: 100 mg via ORAL
  Filled 2013-12-10 (×4): qty 1

## 2013-12-10 MED ORDER — MORPHINE SULFATE 2 MG/ML IJ SOLN: 1.0000 mg | INTRAMUSCULAR | Status: DC | PRN

## 2013-12-10 MED ORDER — 0.9 % SODIUM CHLORIDE (POUR BTL) OPTIME
TOPICAL | Status: DC | PRN
  Administered 2013-12-10: 1000 mL

## 2013-12-10 MED ORDER — INSULIN ASPART 100 UNIT/ML ~~LOC~~ SOLN
0.0000 [IU] | Freq: Every day | SUBCUTANEOUS | Status: DC
  Administered 2013-12-10: 3 [IU] via SUBCUTANEOUS
  Filled 2013-12-10 (×9): qty 0.05

## 2013-12-10 MED ORDER — ROCURONIUM BROMIDE 50 MG/5ML IV SOLN
INTRAVENOUS | Status: AC
  Filled 2013-12-10: qty 1

## 2013-12-10 MED ORDER — KCL IN DEXTROSE-NACL 20-5-0.45 MEQ/L-%-% IV SOLN
INTRAVENOUS | Status: DC
  Filled 2013-12-10 (×2): qty 1000

## 2013-12-10 MED ORDER — NAPROXEN SODIUM 220 MG PO TABS: 220.0000 mg | ORAL_TABLET | Freq: Two times a day (BID) | ORAL | Status: DC | PRN

## 2013-12-10 MED ORDER — OXYCODONE-ACETAMINOPHEN 5-325 MG PO TABS: 1.0000 | ORAL_TABLET | Freq: Three times a day (TID) | ORAL | Status: DC

## 2013-12-10 MED ORDER — POLYETHYLENE GLYCOL 3350 17 G PO PACK
17.0000 g | PACK | Freq: Every day | ORAL | Status: DC | PRN
  Filled 2013-12-10: qty 1

## 2013-12-10 MED ORDER — HYDROMORPHONE HCL 1 MG/ML IJ SOLN
INTRAMUSCULAR | Status: AC
  Administered 2013-12-10: 0.5 mg
  Filled 2013-12-10: qty 1

## 2013-12-10 MED ORDER — PROMETHAZINE HCL 25 MG/ML IJ SOLN
6.2500 mg | INTRAMUSCULAR | Status: DC | PRN
  Administered 2013-12-10: 6.25 mg via INTRAVENOUS

## 2013-12-10 MED ORDER — HYDROCODONE-ACETAMINOPHEN 5-325 MG PO TABS: 1.0000 | ORAL_TABLET | ORAL | Status: DC | PRN

## 2013-12-10 MED ORDER — PANTOPRAZOLE SODIUM 40 MG IV SOLR
40.0000 mg | Freq: Every day | INTRAVENOUS | Status: DC
  Administered 2013-12-10: 40 mg via INTRAVENOUS
  Filled 2013-12-10 (×2): qty 40

## 2013-12-10 MED ORDER — ONDANSETRON HCL 4 MG/2ML IJ SOLN: 4.0000 mg | INTRAMUSCULAR | Status: DC | PRN

## 2013-12-10 SURGICAL SUPPLY — 71 items
ADH SKN CLS APL DERMABOND .7 (GAUZE/BANDAGES/DRESSINGS) ×1
ADH SKN CLS LQ APL DERMABOND (GAUZE/BANDAGES/DRESSINGS) ×1
APL SKNCLS STERI-STRIP NONHPOA (GAUZE/BANDAGES/DRESSINGS)
AQUACEL 3.5INX6IN ×1 IMPLANT
BENZOIN TINCTURE PRP APPL 2/3 (GAUZE/BANDAGES/DRESSINGS) IMPLANT
BIT DRILL NEURO 2X3.1 SFT TUCH (MISCELLANEOUS) ×1 IMPLANT
BLADE CLIPPER SURG (BLADE) IMPLANT
BUR ROUND FLUTED 5 RND (BURR) ×2 IMPLANT
BUR ROUND FLUTED 5MM RND (BURR) ×1
CANISTER SUCT 3000ML (MISCELLANEOUS) ×3 IMPLANT
CLOSURE WOUND 1/2 X4 (GAUZE/BANDAGES/DRESSINGS)
CONT SPEC 4OZ CLIKSEAL STRL BL (MISCELLANEOUS) ×3 IMPLANT
DECANTER SPIKE VIAL GLASS SM (MISCELLANEOUS) ×3 IMPLANT
DERMABOND ADHESIVE PROPEN (GAUZE/BANDAGES/DRESSINGS) ×2
DERMABOND ADVANCED (GAUZE/BANDAGES/DRESSINGS) ×2
DERMABOND ADVANCED .7 DNX12 (GAUZE/BANDAGES/DRESSINGS) ×1 IMPLANT
DERMABOND ADVANCED .7 DNX6 (GAUZE/BANDAGES/DRESSINGS) IMPLANT
DRAPE LAPAROTOMY 100X72X124 (DRAPES) ×3 IMPLANT
DRAPE MICROSCOPE LEICA (MISCELLANEOUS) ×3 IMPLANT
DRAPE POUCH INSTRU U-SHP 10X18 (DRAPES) ×3 IMPLANT
DRAPE SURG 17X23 STRL (DRAPES) ×3 IMPLANT
DRILL NEURO 2X3.1 SOFT TOUCH (MISCELLANEOUS) ×3
DRSG AQUACEL AG ADV 3.5X 6 (GAUZE/BANDAGES/DRESSINGS) ×2 IMPLANT
DRSG TELFA 3X8 NADH (GAUZE/BANDAGES/DRESSINGS) IMPLANT
DURAPREP 26ML APPLICATOR (WOUND CARE) ×3 IMPLANT
ELECT REM PT RETURN 9FT ADLT (ELECTROSURGICAL) ×3
ELECTRODE REM PT RTRN 9FT ADLT (ELECTROSURGICAL) ×1 IMPLANT
GAUZE SPONGE 4X4 12PLY STRL (GAUZE/BANDAGES/DRESSINGS) IMPLANT
GAUZE SPONGE 4X4 16PLY XRAY LF (GAUZE/BANDAGES/DRESSINGS) IMPLANT
GLOVE BIO SURGEON STRL SZ8 (GLOVE) ×3 IMPLANT
GLOVE BIOGEL PI IND STRL 7.0 (GLOVE) IMPLANT
GLOVE BIOGEL PI IND STRL 8 (GLOVE) ×1 IMPLANT
GLOVE BIOGEL PI IND STRL 8.5 (GLOVE) ×1 IMPLANT
GLOVE BIOGEL PI INDICATOR 7.0 (GLOVE) ×2
GLOVE BIOGEL PI INDICATOR 8 (GLOVE) ×2
GLOVE BIOGEL PI INDICATOR 8.5 (GLOVE) ×2
GLOVE ECLIPSE 8.0 STRL XLNG CF (GLOVE) ×5 IMPLANT
GLOVE ECLIPSE 8.5 STRL (GLOVE) ×2 IMPLANT
GLOVE EXAM NITRILE LRG STRL (GLOVE) IMPLANT
GLOVE EXAM NITRILE MD LF STRL (GLOVE) IMPLANT
GLOVE EXAM NITRILE XL STR (GLOVE) IMPLANT
GLOVE EXAM NITRILE XS STR PU (GLOVE) IMPLANT
GLOVE SS BIOGEL STRL SZ 6.5 (GLOVE) IMPLANT
GLOVE SUPERSENSE BIOGEL SZ 6.5 (GLOVE) ×4
GOWN STRL REUS W/ TWL LRG LVL3 (GOWN DISPOSABLE) IMPLANT
GOWN STRL REUS W/ TWL XL LVL3 (GOWN DISPOSABLE) ×1 IMPLANT
GOWN STRL REUS W/TWL 2XL LVL3 (GOWN DISPOSABLE) ×5 IMPLANT
GOWN STRL REUS W/TWL LRG LVL3 (GOWN DISPOSABLE) ×6
GOWN STRL REUS W/TWL XL LVL3 (GOWN DISPOSABLE) ×6
KIT BASIN OR (CUSTOM PROCEDURE TRAY) ×3 IMPLANT
KIT ROOM TURNOVER OR (KITS) ×3 IMPLANT
NDL HYPO 18GX1.5 BLUNT FILL (NEEDLE) IMPLANT
NDL HYPO 25X1 1.5 SAFETY (NEEDLE) ×1 IMPLANT
NEEDLE HYPO 18GX1.5 BLUNT FILL (NEEDLE) IMPLANT
NEEDLE HYPO 25X1 1.5 SAFETY (NEEDLE) ×3 IMPLANT
NS IRRIG 1000ML POUR BTL (IV SOLUTION) ×3 IMPLANT
PACK LAMINECTOMY NEURO (CUSTOM PROCEDURE TRAY) ×3 IMPLANT
PAD ARMBOARD 7.5X6 YLW CONV (MISCELLANEOUS) ×9 IMPLANT
PAD DRESSING TELFA 3X8 NADH (GAUZE/BANDAGES/DRESSINGS) IMPLANT
RUBBERBAND STERILE (MISCELLANEOUS) ×6 IMPLANT
SPONGE SURGIFOAM ABS GEL SZ50 (HEMOSTASIS) ×3 IMPLANT
STRIP CLOSURE SKIN 1/2X4 (GAUZE/BANDAGES/DRESSINGS) IMPLANT
SUT VIC AB 0 CT1 18XCR BRD8 (SUTURE) ×1 IMPLANT
SUT VIC AB 0 CT1 8-18 (SUTURE) ×3
SUT VIC AB 2-0 CT1 18 (SUTURE) ×3 IMPLANT
SUT VIC AB 3-0 SH 8-18 (SUTURE) ×3 IMPLANT
SYR 20ML ECCENTRIC (SYRINGE) ×3 IMPLANT
SYR 5ML LL (SYRINGE) IMPLANT
TOWEL OR 17X24 6PK STRL BLUE (TOWEL DISPOSABLE) ×3 IMPLANT
TOWEL OR 17X26 10 PK STRL BLUE (TOWEL DISPOSABLE) ×3 IMPLANT
WATER STERILE IRR 1000ML POUR (IV SOLUTION) ×3 IMPLANT

## 2013-12-10 NOTE — Progress Notes (Signed)
Awake, alert, conversant.  MAEW with good strength.  Doing well. 

## 2013-12-10 NOTE — Plan of Care (Signed)
Problem: Consults Goal: Diagnosis - Spinal Surgery Outcome: Completed/Met Date Met:  12/10/13 Microdiscectomy

## 2013-12-10 NOTE — H&P (Signed)
> [address] [place], [place] [address] Phone: [phone]   Patient ID:   [id number] Patient: Pamela Torres  Date of Birth: 05-25-1965 Visit Type: Office Visit   Date: 10/30/2013 12:45 PM Provider: Marchia Meiers. Vertell Limber MD   This 48 year old female presents for Follow Up of back pain.  History of Present Illness: 1.  Follow Up of back pain  Pt returns to review MRI  Patient comes in today to review her lumbar MRI which shows a large recurrent disc herniation at L5-S1 on the right.  Based on the severity of her pain, weakness, imaging findings, I recommended that she proceed with surgery.  This will consist of a redo right L5-S1 microdiscectomy.  This will be performed at [facility] on 11/27/13.  Risks and benefits were discussed in detail with the patient and she wishes to proceed with surgery.      Medical/Surgical/Interim History Reviewed, no change.  Last detailed document date:03/11/2013.   PAST MEDICAL HISTORY, SURGICAL HISTORY, FAMILY HISTORY, SOCIAL HISTORY AND REVIEW OF SYSTEMS I have reviewed the patient's past medical, surgical, family and social history as well as the comprehensive review of systems as included on the [facility] history form dated 01/09/2013, which I have signed.  Family History: Reviewed, no changes.  Last detailed document: 03/11/2013.   Social History: Tobacco use reviewed. Reviewed, no changes. Last detailed document date: 03/11/2013.      MEDICATIONS(added, continued or stopped this visit):   Started Medication Directions Instruction Stopped   glipizide 5 mg tablet take 1 tablet by oral route  every day before meals     metformin 500 mg tablet take 1 tablet by oral route 2 times every day with morning and evening meals    10/25/2013 Percocet 5 mg-325 mg tablet take 1 tablet by oral route 3 times every day as needed    10/25/2013 Robaxin 500 mg tablet take 1 tablet by oral route 3 times every day  prn spasm      ALLERGIES:  Ingredient Reaction Medication Name Comment  NO KNOWN ALLERGIES     No known allergies.   Vitals Date Temp F BP Pulse Ht In Wt Lb BMI BSA Pain Score  10/30/2013  130/76 85 62 239.6 43.82        DIAGNOSTIC RESULTS Diagnostic report text  CLINICAL DATA: 48 year old female with pain radiating to the right lower extremity; posterior thigh radiating down the leg. Lumbosacral neuritis or radiculitis. Initial encounter.  EXAM: MRI LUMBAR SPINE WITHOUT AND WITH CONTRAST  TECHNIQUE: Multiplanar and multiecho pulse sequences of the lumbar spine were obtained without and with intravenous contrast.  CONTRAST: 39mL MULTIHANCE GADOBENATE DIMEGLUMINE 529 MG/ML IV SOLN  COMPARISON: Lumbar radiographs 09/30/2013. [facility] lumbar MRI 09/28/2012.  FINDINGS: Normal lumbar segmentation depicted on 09/30/2013, in this appears to conform to the numbering system utilized in 2014. Stable vertebral height and alignment. Incidental L2 benign vertebral body hemangioma. No marrow edema or evidence of acute osseous abnormality.  Visualized lower thoracic spinal cord is normal with conus medularis at T12-L1. No abnormal intradural enhancement identified.  Negative visualized abdominal viscera.  T10-T11: Negative.  T11-T12: Chronic disc desiccation and disc space loss. Chronic right paracentral the subarticular small disc extrusion appears stable, best seen on series 8, image 7. No definite spinal stenosis. No foraminal involvement.  T12-L1: Mild facet hypertrophy is stable and greater on the left.  L1-L2: Moderate facet hypertrophy, mildly progressed. Trace facet joint fluid.  Mild disc bulge. No significant stenosis.  L2-L3: Moderate facet hypertrophy is stable. No significant stenosis.  L3-L4: Moderate facet hypertrophy is stable. No stenosis.  L4-L5: Mild chronic disc desiccation. Left eccentric disc bulge appears not significantly changed on axial images. Moderate facet  hypertrophy is stable. There is mild left greater than right lateral recess stenosis (descending L5 nerve root level). No spinal or foraminal stenosis.  L5-S1: Postoperative changes on the right, appear to be new since 2014. Right partial laminectomy. Underlying facet hypertrophy appears increased, along with facet joint fluid. Chronic disc desiccation at this level. Increased disc space loss, and bulky new right paracentral disc extrusion, best seen on series 8, image 6 and series 6, image 36. Superimposed epidural lipomatosis. Severe right lateral recess stenosis. Spinal stenosis at this level in part related to the epidural fat. No definite L5 foraminal involvement by disc.  IMPRESSION: 1. Large L5-S1 right paracentral disc extrusion, new along with postoperative changes to the right lamina since 2014. Severe stenosis at the level of the descending right S1 and S2 nerve roots. 2. Stable to mild progression of lumbar spine degeneration elsewhere.   Electronically Signed By: Lars Pinks M.D. On: 10/24/2013 14:47   Embedded Images (not for diagnostic purposes)        IMPRESSION Recurrent right S1 radiculopathy with large recurrent disc herniation  Completed Orders (this encounter) Order Details Reason Side Interpretation Result Initial Treatment Date Region  Lifestyle education regarding diet Patient is currently taking medication for pain as prescribed.        Lifestyle education regarding diet Encouraged to eat a well balanced diet and follow up with primary care physician.         Assessment/Plan # Detail Type Description   1. Assessment Herniated lumbar intervertebral disc (722.10).       2. Assessment Lumbar radiculopathy (724.4).       3. Assessment Lumbago (724.2).       4. Assessment Lumbar spondylosis (721.3).       5. Assessment BMI 40.0-44.9, ADULT (V85.41).   Plan Orders  Today's instructions / counseling include(s) Lifestyle education regarding diet and Lifestyle education  regarding diet.         Pain Assessment/Treatment Location: back. Onset: 09/16/2013. Duration: varies. Quality: discomforting. Pain Assessment/Treatment follow-up plan of care: Patient is currently taking medication for pain as prescribed..  Fall Risk Plan The patient has not fallen in the last year.  Redo right L5-S1 microdiscectomy on 11/27/13 at [facility].  Risks and benefits were discussed in detail with the patient and she wishes to proceed.  Orders: Instruction(s)/Education: Assessment Instruction  V85.41 Lifestyle education regarding diet  V85.41 Lifestyle education regarding diet             Provider:  Marchia Meiers. Vertell Limber MD  11/03/2013 08:12 PM Dictation edited by: Marchia Meiers. Vertell Limber    CC Providers: Milinda Pointer [facility] [place], [place] [address]- ----------------------------------------------------------------------------------------------------------------------------------------------------------------------         Electronically signed by Marchia Meiers Vertell Limber MD on 11/03/2013 08:13 PM

## 2013-12-10 NOTE — Progress Notes (Signed)
Once pt OOB, coing of pain, Dilaudid given times 2 doses, ice pack to incision per floor RN, pt. Resting in bed at this time.

## 2013-12-10 NOTE — Transfer of Care (Signed)
Immediate Anesthesia Transfer of Care Note  Patient: Pamela Torres  Procedure(s) Performed: Procedure(s) with comments: Right Lumbar Five to Sacral One Redo Microdiskectomy (Right) - Right L5-S1 Redo Microdiskectomy  Patient Location: PACU  Anesthesia Type:General  Level of Consciousness: awake, alert , oriented and patient cooperative  Airway & Oxygen Therapy: Patient Spontanous Breathing and Patient connected to nasal cannula oxygen  Post-op Assessment: Report given to PACU RN, Post -op Vital signs reviewed and stable and Patient moving all extremities  Post vital signs: Reviewed and stable  Complications: No apparent anesthesia complications

## 2013-12-10 NOTE — Op Note (Signed)
12/10/2013  12:09 PM  PATIENT:  Pamela Torres  48 y.o. female  PRE-OPERATIVE DIAGNOSIS:  Recurrent Lumbar herniated nucleus pulposus without myelopathy, Lumbago, Lumbar radiculopathy L 5 S 1 right  POST-OPERATIVE DIAGNOSIS:  Recurrent Lumbar herniated nucleus pulposus without myelopathy, Lumbago, Lumbar radiculopathy L 5 S 1 right  PROCEDURE:  Procedure(s) with comments: Right Lumbar Five to Sacral One Redo Microdiskectomy (Right) - Right L5-S1 Redo Microdiskectomy  SURGEON:  Surgeon(s) and Role:    * Erline Levine, MD - Primary    * Faythe Ghee, MD - Assisting  PHYSICIAN ASSISTANT:   ASSISTANTS: Poteat, RN   ANESTHESIA:   general  EBL:  Total I/O In: 1100 [I.V.:1100] Out: -   BLOOD ADMINISTERED:none  DRAINS: none   LOCAL MEDICATIONS USED:  LIDOCAINE   SPECIMEN:  No Specimen  DISPOSITION OF SPECIMEN:  N/A  COUNTS:  YES  TOURNIQUET:  * No tourniquets in log *  DICTATION: Patient has a large L 5 S 1 disc rupture on the right with significant right leg weakness and pain. It was elected to take her to surgery for right L 5 S 1  Redo microdiscectomy.  Procedure: Patient was brought to the operating room and following the smooth and uncomplicated induction of general endotracheal anesthesia she was placed in a prone position on the Wilson frame. Low back was prepped and draped in the usual sterile fashion with betadine scrub and DuraPrep. Area of planned incision was infiltrated with local lidocaine. Incision was made in the midline and carried to the lumbodorsal fascia which was incised on the right side of midline. Subperiosteal dissection was performed exposing what was felt to be L 5 S 1 level. Intraoperative x-ray demonstrated that the marker probe was at the correct level with the digital machine . Exposure was made at the site of prior laminectomy. The previous bony defect was defined and bone removal was extended in each direction with the high speed drill and  completed with Kerrison rongeurs and a generous foraminotomy was performed overlying the superior aspect of the S 1 lamina. Scar tissue was detached and removed in a piecemeal fashion and under the microscope, the thecal sac and S 1 nerve root were mobilized medially.  Neural elements were draped under pressure over a large herniation.  Using painstaking microdissection, I was able to mobilize these neural elements and then incised the capsule around the disc and removed a large fragment of disc material.  The S 1 nerve root was decompressed laterally with removal of the superior aspect of the facet and ligamentum causing nerve root compression. There was a large amount of additional disc material which I removed, which extended into the interspace.  I was able at this point to mobilize the nerve medially and to palpate along its course with ball hooks and confirm that there were no additional compressive disc fragments. I incised the interspace and evacuated additional disc material with a variety of pituitary rongeurs. The interspace was then irrigated with saline and no additional disc material was mobilized. Hemostasis was assured with bipolar electrocautery and the interspace was irrigated with Depo-Medrol and fentanyl. The lumbodorsal fascia was closed with 0 Vicryl sutures the subcutaneous tissues reapproximated 2-0 Vicryl inverted sutures and the skin edges were reapproximated with 3-0 Vicryl subcuticular stitch. The wound was dressed with Dermabond and an occlusive dressing. Patient was extubated in the operating room and taken to recovery in stable and satisfactory condition having tolerated her operation well. Counts were correct at  the end of the case.  PLAN OF CARE: Admit for overnight observation  PATIENT DISPOSITION:  PACU - hemodynamically stable.   Delay start of Pharmacological VTE agent (>24hrs) due to surgical blood loss or risk of bleeding: yes

## 2013-12-10 NOTE — Brief Op Note (Signed)
12/10/2013  12:09 PM  PATIENT:  Pamela Torres  48 y.o. female  PRE-OPERATIVE DIAGNOSIS:  Recurrent Lumbar herniated nucleus pulposus without myelopathy, Lumbago, Lumbar radiculopathy L 5 S 1 right  POST-OPERATIVE DIAGNOSIS:  Recurrent Lumbar herniated nucleus pulposus without myelopathy, Lumbago, Lumbar radiculopathy L 5 S 1 right  PROCEDURE:  Procedure(s) with comments: Right Lumbar Five to Sacral One Redo Microdiskectomy (Right) - Right L5-S1 Redo Microdiskectomy  SURGEON:  Surgeon(s) and Role:    * Erline Levine, MD - Primary    * Faythe Ghee, MD - Assisting  PHYSICIAN ASSISTANT:   ASSISTANTS: Poteat, RN   ANESTHESIA:   general  EBL:  Total I/O In: 1100 [I.V.:1100] Out: -   BLOOD ADMINISTERED:none  DRAINS: none   LOCAL MEDICATIONS USED:  LIDOCAINE   SPECIMEN:  No Specimen  DISPOSITION OF SPECIMEN:  N/A  COUNTS:  YES  TOURNIQUET:  * No tourniquets in log *  DICTATION: Patient has a large L 5 S 1 disc rupture on the right with significant right leg weakness and pain. It was elected to take her to surgery for right L 5 S 1  Redo microdiscectomy.  Procedure: Patient was brought to the operating room and following the smooth and uncomplicated induction of general endotracheal anesthesia she was placed in a prone position on the Wilson frame. Low back was prepped and draped in the usual sterile fashion with betadine scrub and DuraPrep. Area of planned incision was infiltrated with local lidocaine. Incision was made in the midline and carried to the lumbodorsal fascia which was incised on the right side of midline. Subperiosteal dissection was performed exposing what was felt to be L 5 S 1 level. Intraoperative x-ray demonstrated that the marker probe was at the correct level with the digital machine . Exposure was made at the site of prior laminectomy. The previous bony defect was defined and bone removal was extended in each direction with the high speed drill and  completed with Kerrison rongeurs and a generous foraminotomy was performed overlying the superior aspect of the S 1 lamina. Scar tissue was detached and removed in a piecemeal fashion and under the microscope, the thecal sac and S 1 nerve root were mobilized medially.  Neural elements were draped under pressure over a large herniation.  Using painstaking microdissection, I was able to mobilize these neural elements and then incised the capsule around the disc and removed a large fragment of disc material.  The S 1 nerve root was decompressed laterally with removal of the superior aspect of the facet and ligamentum causing nerve root compression. There was a large amount of additional disc material which I removed, which extended into the interspace.  I was able at this point to mobilize the nerve medially and to palpate along its course with ball hooks and confirm that there were no additional compressive disc fragments. I incised the interspace and evacuated additional disc material with a variety of pituitary rongeurs. The interspace was then irrigated with saline and no additional disc material was mobilized. Hemostasis was assured with bipolar electrocautery and the interspace was irrigated with Depo-Medrol and fentanyl. The lumbodorsal fascia was closed with 0 Vicryl sutures the subcutaneous tissues reapproximated 2-0 Vicryl inverted sutures and the skin edges were reapproximated with 3-0 Vicryl subcuticular stitch. The wound was dressed with Dermabond and an occlusive dressing. Patient was extubated in the operating room and taken to recovery in stable and satisfactory condition having tolerated her operation well. Counts were correct at  the end of the case.  PLAN OF CARE: Admit for overnight observation  PATIENT DISPOSITION:  PACU - hemodynamically stable.   Delay start of Pharmacological VTE agent (>24hrs) due to surgical blood loss or risk of bleeding: yes

## 2013-12-10 NOTE — Anesthesia Preprocedure Evaluation (Signed)
Anesthesia Evaluation  Patient identified by MRN, date of birth, ID band Patient awake    Reviewed: Allergy & Precautions, H&P , NPO status , Patient's Chart, lab work & pertinent test results  History of Anesthesia Complications Negative for: history of anesthetic complications  Airway Mallampati: II  Neck ROM: Full    Dental  (+) Teeth Intact   Pulmonary sleep apnea ,  breath sounds clear to auscultation        Cardiovascular Rate:Normal     Neuro/Psych  Neuromuscular disease    GI/Hepatic   Endo/Other  Poorly Controlled  Renal/GU      Musculoskeletal  (+) Arthritis -,   Abdominal (+) + obese,   Peds  Hematology  (+) anemia ,   Anesthesia Other Findings   Reproductive/Obstetrics                           Anesthesia Physical Anesthesia Plan  ASA: III  Anesthesia Plan:    Post-op Pain Management:    Induction: Intravenous  Airway Management Planned: Oral ETT  Additional Equipment:   Intra-op Plan:   Post-operative Plan: Extubation in OR  Informed Consent: I have reviewed the patients History and Physical, chart, labs and discussed the procedure including the risks, benefits and alternatives for the proposed anesthesia with the patient or authorized representative who has indicated his/her understanding and acceptance.   Dental advisory given  Plan Discussed with:   Anesthesia Plan Comments:         Anesthesia Quick Evaluation

## 2013-12-10 NOTE — Anesthesia Postprocedure Evaluation (Signed)
  Anesthesia Post-op Note  Patient: Pamela Torres  Procedure(s) Performed: Procedure(s) with comments: Right Lumbar Five to Sacral One Redo Microdiskectomy (Right) - Right L5-S1 Redo Microdiskectomy  Patient Location: PACU  Anesthesia Type:General  Level of Consciousness: awake and alert   Airway and Oxygen Therapy: Patient Spontanous Breathing  Post-op Pain: mild  Post-op Assessment: Post-op Vital signs reviewed  Post-op Vital Signs: stable  Last Vitals:  Filed Vitals:   12/10/13 1307  BP: 142/74  Pulse: 64  Temp:   Resp: 11    Complications: No apparent anesthesia complications

## 2013-12-11 DIAGNOSIS — M5116 Intervertebral disc disorders with radiculopathy, lumbar region: Secondary | ICD-10-CM | POA: Diagnosis not present

## 2013-12-11 NOTE — Progress Notes (Signed)
PT. UP AD LIB IN HALL, VOIDING WITHOUT DIFFICULTIES, TOLERATING DIET AND PO PAIN MEDICATIONS.  PT. AND SPOUSE VERBALIZED UNDERSTANDING OF DISCHARGE INSTRUCTIONS. V/S STABLE.  PT. DISCHARGED VIA W/C WITH STAFF AND SPOUSE ASSISTING PT. TO CAR.  [place] RN

## 2013-12-11 NOTE — Discharge Summary (Signed)
Physician Discharge Summary  Patient ID: Pamela Torres MRN: [id number] DOB/AGE: 1966-01-03 48 y.o.  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 Patient did well and mobilized without difficulty.  Consults: None  Significant Diagnostic Studies: None  Treatments: surgery: redo discectomy L 5 S 1 right for recurrent disc herniation  Discharge Exam: Blood pressure 143/82, pulse 74, temperature 98.2 F (36.8 C), temperature source Oral, resp. rate 16, height 5\' 1"  (1.549 m), weight 109.77 kg (242 lb), SpO2 96.00%. Neurologic: Alert and oriented X 3, normal strength and tone. Normal symmetric reflexes. Normal coordination and gait Wound:CDI  Disposition: Home     Medication List    ASK your doctor about these medications       ALEVE 220 MG tablet  Generic drug:  naproxen sodium  Take 220 mg by mouth 2 (two) times daily as needed (pain).     glipiZIDE 5 MG tablet  Commonly known as:  GLUCOTROL  Take 5 mg by mouth daily before breakfast.     metFORMIN 500 MG tablet  Commonly known as:  GLUCOPHAGE  Take 500 mg by mouth 2 (two) times daily.     methocarbamol 500 MG tablet  Commonly known as:  ROBAXIN  Take 500 mg by mouth 3 (three) times daily.     oxyCODONE-acetaminophen 5-325 MG per tablet  Commonly known as:  PERCOCET/ROXICET  Take 1 tablet by mouth 3 (three) times daily. scheduled           Follow-up Information   Follow up with Peggyann Shoals, MD.   Specialty:  Neurosurgery   Contact information:   [address] [place] [address] [phone]       Signed: Peggyann Shoals, MD 12/11/2013, 8:30 AM

## 2013-12-17 ENCOUNTER — Encounter (HOSPITAL_COMMUNITY): Payer: Self-pay | Admitting: Neurosurgery

## 2013-12-27 ENCOUNTER — Encounter (HOSPITAL_COMMUNITY): Payer: Self-pay | Admitting: Neurosurgery

## 2013-12-30 ENCOUNTER — Encounter (HOSPITAL_COMMUNITY): Payer: Self-pay | Admitting: Neurosurgery

## 2014-05-06 ENCOUNTER — Other Ambulatory Visit (HOSPITAL_COMMUNITY): Payer: Self-pay | Admitting: Neurosurgery

## 2014-05-06 DIAGNOSIS — M5126 Other intervertebral disc displacement, lumbar region: Secondary | ICD-10-CM

## 2014-05-22 ENCOUNTER — Ambulatory Visit (HOSPITAL_COMMUNITY)
Admission: RE | Admit: 2014-05-22 | Discharge: 2014-05-22 | Disposition: A | Discharge status: Home / Self Care | Payer: Medicaid Other | Source: Ambulatory Visit | Attending: Neurosurgery | Admitting: Neurosurgery

## 2014-05-22 DIAGNOSIS — D1809 Hemangioma of other sites: Secondary | ICD-10-CM | POA: Insufficient documentation

## 2014-05-22 DIAGNOSIS — M5126 Other intervertebral disc displacement, lumbar region: Secondary | ICD-10-CM

## 2014-05-22 DIAGNOSIS — M5117 Intervertebral disc disorders with radiculopathy, lumbosacral region: Secondary | ICD-10-CM | POA: Insufficient documentation

## 2014-05-22 LAB — POCT I-STAT CREATININE: Creatinine, Ser: 0.6 mg/dL (ref 0.50–1.10)

## 2014-05-22 MED ORDER — GADOBENATE DIMEGLUMINE 529 MG/ML IV SOLN
20.0000 mL | Freq: Once | INTRAVENOUS | Status: AC | PRN
  Administered 2014-05-22: 20 mL via INTRAVENOUS

## 2014-06-09 ENCOUNTER — Other Ambulatory Visit: Payer: Self-pay | Admitting: Neurosurgery

## 2014-06-20 ENCOUNTER — Encounter (HOSPITAL_COMMUNITY)
Admission: RE | Admit: 2014-06-20 | Discharge: 2014-06-20 | Disposition: A | Discharge status: Home / Self Care | Payer: Medicaid Other | Source: Ambulatory Visit | Attending: Neurosurgery | Admitting: Neurosurgery

## 2014-06-20 ENCOUNTER — Encounter (HOSPITAL_COMMUNITY): Payer: Self-pay

## 2014-06-20 DIAGNOSIS — Z01818 Encounter for other preprocedural examination: Secondary | ICD-10-CM | POA: Diagnosis not present

## 2014-06-20 HISTORY — DX: Reserved for concepts with insufficient information to code with codable children: IMO0002

## 2014-06-20 HISTORY — DX: Presence of spectacles and contact lenses: Z97.3

## 2014-06-20 LAB — SURGICAL PCR SCREEN
MRSA, PCR: NEGATIVE
Staphylococcus aureus: NEGATIVE

## 2014-06-20 LAB — CBC
HCT: 40.2 % (ref 36.0–46.0)
HEMOGLOBIN: 13.5 g/dL (ref 12.0–15.0)
MCH: 29.3 pg (ref 26.0–34.0)
MCHC: 33.6 g/dL (ref 30.0–36.0)
MCV: 87.2 fL (ref 78.0–100.0)
Platelets: 265 10*3/uL (ref 150–400)
RBC: 4.61 MIL/uL (ref 3.87–5.11)
RDW: 13.4 % (ref 11.5–15.5)
WBC: 8.4 10*3/uL (ref 4.0–10.5)

## 2014-06-20 LAB — ABO/RH: ABO/RH(D): B POS

## 2014-06-20 LAB — BASIC METABOLIC PANEL
Anion gap: 13 (ref 5–15)
BUN: 11 mg/dL (ref 6–23)
CALCIUM: 9 mg/dL (ref 8.4–10.5)
CO2: 21 mmol/L (ref 19–32)
Chloride: 104 mmol/L (ref 96–112)
Creatinine, Ser: 0.71 mg/dL (ref 0.50–1.10)
GFR calc Af Amer: >90 mL/min (ref >90)
GFR calc non Af Amer: >90 mL/min (ref >90)
GLUCOSE: 124 mg/dL — AB (ref 70–99)
POTASSIUM: 4 mmol/L (ref 3.5–5.1)
SODIUM: 138 mmol/L (ref 135–145)

## 2014-06-20 LAB — TYPE AND SCREEN
ABO/RH(D): B POS
ANTIBODY SCREEN: NEGATIVE

## 2014-06-20 NOTE — Progress Notes (Signed)
Pt denies SOB, chest pain, and being under the care of a cardiologist. Pt denies having a chest x ray within the last year. Pt chart forwarded to [place], [place] ( anesthesia)  for review of cardiac history.

## 2014-06-20 NOTE — Pre-Procedure Instructions (Signed)
Pamela Torres  06/20/2014   Your procedure is scheduled on:  Thursday, June 26, 2014  Report to [facility] Admitting at 5:30 AM.  Call this number if you have problems the morning of surgery: [phone]   Remember:   Do not eat food or drink liquids after midnight Wednesday, June 25, 2014   Take these medicines the morning of surgery with A SIP OF WATER: if needed: oxyCODONE-acetaminophen (PERCOCET/ROXICET) for pain  DO NOT take any diabetic medication the morning of procedure such as metFORMIN (GLUCOPHAGE)   Stop taking Aspirin, Coumadin, Plavix, Effient, and herbal medications. Do not take any NSAIDs ie: Ibuprofen, Advil, Naproxen or any medication containing Aspirin; stop now.   Do not wear jewelry, make-up or nail polish.  Do not wear lotions, powders, or perfumes. You Pamela Torres not wear deodorant.  Do not shave 48 hours prior to surgery.   Do not bring valuables to the hospital.  [facility] is not responsible for any belongings or valuables.               Contacts, dentures or bridgework Pamela Torres not be worn into surgery.  Leave suitcase in the car. After surgery it Pamela Torres be brought to your room.  For patients admitted to the hospital, discharge time is determined by your treatment team.               Patients discharged the day of surgery will not be allowed to drive home.  Name and phone number of your driver:   Special Instructions:  Special Instructions:Special Instructions: [facility] - Preparing for Surgery  Before surgery, you can play an important role.  Because skin is not sterile, your skin needs to be as free of germs as possible.  You can reduce the number of germs on you skin by washing with CHG (chlorahexidine gluconate) soap before surgery.  CHG is an antiseptic cleaner which kills germs and bonds with the skin to continue killing germs even after washing.  Please DO NOT use if you have an allergy to CHG or antibacterial soaps.  If your skin becomes  reddened/irritated stop using the CHG and inform your nurse when you arrive at Short Stay.  Do not shave (including legs and underarms) for at least 48 hours prior to the first CHG shower.  You Pamela Torres shave your face.  Please follow these instructions carefully:   1.  Shower with CHG Soap the night before surgery and the morning of Surgery.  2.  If you choose to wash your hair, wash your hair first as usual with your normal shampoo.  3.  After you shampoo, rinse your hair and body thoroughly to remove the Shampoo.  4.  Use CHG as you would any other liquid soap.  You can apply chg directly  to the skin and wash gently with scrungie or a clean washcloth.  5.  Apply the CHG Soap to your body ONLY FROM THE NECK DOWN.  Do not use on open wounds or open sores.  Avoid contact with your eyes, ears, mouth and genitals (private parts).  Wash genitals (private parts) with your normal soap.  6.  Wash thoroughly, paying special attention to the area where your surgery will be performed.  7.  Thoroughly rinse your body with warm water from the neck down.  8.  DO NOT shower/wash with your normal soap after using and rinsing off the CHG Soap.  9.  Pat yourself dry with a clean towel.  10.  Wear clean pajamas.            11.  Place clean sheets on your bed the night of your first shower and do not sleep with pets.  Day of Surgery  Do not apply any lotions/deoderants the morning of surgery.  Please wear clean clothes to the hospital/surgery center.   Please read over the following fact sheets that you were given: Pain Booklet, Coughing and Deep Breathing, Blood Transfusion Information, MRSA Information and Surgical Site Infection Prevention

## 2014-06-23 NOTE — Progress Notes (Signed)
Anesthesia Chart Review:  Pt is 49 year old female scheduled for L5-S1 maximum access PLIF on 06/26/2014 with Dr. Vertell Limber.   PMH includes: DM, OSA (does not wear CPAP), DVT (during pregnancy), anemia. Never smoker. BMI 44. S/p L5-S1 Redo microdiskectomy on 12/10/13.   Preoperative labs reviewed.    EKG 12/04/2013: NSR. Possible Left atrial enlargement. T wave abnormality, consider anterior ischemia. Prolonged QT.   [facility] did not have an old EKG on file although Dr. Saralyn Pilar' 01/10/11 office note states she had an abnormal EKG revealing evidence of possible old anteroseptal MI and that she had an abnormal ETT Sestamibi study showing anterior ischemia. (Since his office does not have an EKG on file, I think he must be referring to tracings done during her stress test.) [facility] and Dr[facility] Thomes Dinning offices were both contacted for old EKGs, but neither of these places have an EKG on file. [facility] and [facility] do not have a prior EKG on file either.   01/05/11 ETT Sestamibi study report stated: Normal treadmill ECG without evidence of ischemia or dysrhythmia, average exercise tolerance for age nomrla LVF with EF 59%, abnormal myocardial perfusion images consistent with myocardial ischemia. Subsequently, patient had a cardiac cath [facility]) on 01/19/11 that showed: normal coronary anatomy, EF 64%.  Echo performed on 01/05/11 showed normal LV systolic function with moderate LVH, mild LAE, EF > 55%. Trace aortic, mitral, pulmonic, tricuspid insufficiency.  Patient had "consider anterior ischemia" on EKG. I have not been able to locate any comparison EKG; however, she had normal coronaries and EF by cath 3.5 years ago and tolerated L5-S1 microdiskectomy last October. No reported CV symptoms at PAT. She will be further evaluated by her assigned anesthesiologist on the day of surgery, but if no acute changes then I would anticipate that she could proceed as planned.   If no changes, I anticipate pt can  proceed with surgery as scheduled.   Pamela Cass, FNP-BC [facility] Stay Surgical [facility]/Anesthesiology Phone: [phone] 06/23/2014 4:38 PM

## 2014-06-25 MED ORDER — CEFAZOLIN SODIUM-DEXTROSE 2-3 GM-% IV SOLR
2.0000 g | INTRAVENOUS | Status: AC
  Administered 2014-06-26: 2 g via INTRAVENOUS
  Administered 2014-06-26: 1 g via INTRAVENOUS

## 2014-06-26 ENCOUNTER — Inpatient Hospital Stay (HOSPITAL_COMMUNITY)
Admission: RE | Admit: 2014-06-26 | Discharge: 2014-06-27 | DRG: 460 | Disposition: A | Discharge status: Home / Self Care | Payer: Medicaid Other | Source: Ambulatory Visit | Attending: Neurosurgery | Admitting: Neurosurgery

## 2014-06-26 ENCOUNTER — Inpatient Hospital Stay (HOSPITAL_COMMUNITY): Payer: Medicaid Other

## 2014-06-26 ENCOUNTER — Inpatient Hospital Stay (HOSPITAL_COMMUNITY): Payer: Medicaid Other | Admitting: Anesthesiology

## 2014-06-26 ENCOUNTER — Encounter (HOSPITAL_COMMUNITY): Payer: Self-pay | Admitting: *Deleted

## 2014-06-26 ENCOUNTER — Inpatient Hospital Stay (HOSPITAL_COMMUNITY): Payer: Medicaid Other | Admitting: Emergency Medicine

## 2014-06-26 ENCOUNTER — Encounter (HOSPITAL_COMMUNITY)
Admission: RE | Disposition: A | Discharge status: Home / Self Care | Payer: Self-pay | Source: Ambulatory Visit | Attending: Neurosurgery

## 2014-06-26 DIAGNOSIS — Z6841 Body Mass Index (BMI) 40.0 and over, adult: Secondary | ICD-10-CM

## 2014-06-26 DIAGNOSIS — M47817 Spondylosis without myelopathy or radiculopathy, lumbosacral region: Secondary | ICD-10-CM | POA: Diagnosis present

## 2014-06-26 DIAGNOSIS — Z419 Encounter for procedure for purposes other than remedying health state, unspecified: Secondary | ICD-10-CM

## 2014-06-26 DIAGNOSIS — M5126 Other intervertebral disc displacement, lumbar region: Secondary | ICD-10-CM | POA: Diagnosis present

## 2014-06-26 DIAGNOSIS — E1165 Type 2 diabetes mellitus with hyperglycemia: Secondary | ICD-10-CM | POA: Diagnosis present

## 2014-06-26 DIAGNOSIS — M5116 Intervertebral disc disorders with radiculopathy, lumbar region: Principal | ICD-10-CM | POA: Diagnosis present

## 2014-06-26 HISTORY — PX: MAXIMUM ACCESS (MAS)POSTERIOR LUMBAR INTERBODY FUSION (PLIF) 1 LEVEL: SHX6368

## 2014-06-26 LAB — GLUCOSE, CAPILLARY
GLUCOSE-CAPILLARY: 180 mg/dL — AB (ref 70–99)
Glucose-Capillary: 133 mg/dL — ABNORMAL HIGH (ref 70–99)
Glucose-Capillary: 144 mg/dL — ABNORMAL HIGH (ref 70–99)
Glucose-Capillary: 184 mg/dL — ABNORMAL HIGH (ref 70–99)

## 2014-06-26 SURGERY — FOR MAXIMUM ACCESS (MAS) POSTERIOR LUMBAR INTERBODY FUSION (PLIF) 1 LEVEL
Anesthesia: General | Site: Back

## 2014-06-26 MED ORDER — LIDOCAINE-EPINEPHRINE 1 %-1:100000 IJ SOLN
INTRAMUSCULAR | Status: DC | PRN
  Administered 2014-06-26: 10 mL

## 2014-06-26 MED ORDER — METHOCARBAMOL 1000 MG/10ML IJ SOLN: 500.0000 mg | Freq: Four times a day (QID) | INTRAVENOUS | Status: DC | PRN

## 2014-06-26 MED ORDER — ACETAMINOPHEN 10 MG/ML IV SOLN
INTRAVENOUS | Status: AC
  Administered 2014-06-26: 1000 mg via INTRAVENOUS
  Filled 2014-06-26: qty 100

## 2014-06-26 MED ORDER — ONDANSETRON HCL 4 MG/2ML IJ SOLN
INTRAMUSCULAR | Status: AC
  Filled 2014-06-26: qty 2

## 2014-06-26 MED ORDER — SUCCINYLCHOLINE CHLORIDE 20 MG/ML IJ SOLN
INTRAMUSCULAR | Status: DC | PRN
  Administered 2014-06-26: 20 mg via INTRAVENOUS

## 2014-06-26 MED ORDER — MIDAZOLAM HCL 5 MG/5ML IJ SOLN
INTRAMUSCULAR | Status: DC | PRN
  Administered 2014-06-26: 2 mg via INTRAVENOUS

## 2014-06-26 MED ORDER — METHOCARBAMOL 500 MG PO TABS
ORAL_TABLET | ORAL | Status: AC
  Administered 2014-06-26: 500 mg via ORAL
  Filled 2014-06-26: qty 1

## 2014-06-26 MED ORDER — LACTATED RINGERS IV SOLN
INTRAVENOUS | Status: DC | PRN
  Administered 2014-06-26 (×3): via INTRAVENOUS

## 2014-06-26 MED ORDER — ROCURONIUM BROMIDE 50 MG/5ML IV SOLN
INTRAVENOUS | Status: AC
  Filled 2014-06-26: qty 1

## 2014-06-26 MED ORDER — HEMOSTATIC AGENTS (NO CHARGE) OPTIME
TOPICAL | Status: DC | PRN
  Administered 2014-06-26: 1 via TOPICAL

## 2014-06-26 MED ORDER — OXYCODONE-ACETAMINOPHEN 5-325 MG PO TABS: 1.0000 | ORAL_TABLET | Freq: Three times a day (TID) | ORAL | Status: DC | PRN

## 2014-06-26 MED ORDER — FLEET ENEMA 7-19 GM/118ML RE ENEM
1.0000 | ENEMA | Freq: Once | RECTAL | Status: AC | PRN
  Filled 2014-06-26: qty 1

## 2014-06-26 MED ORDER — INSULIN ASPART 100 UNIT/ML ~~LOC~~ SOLN
0.0000 [IU] | Freq: Three times a day (TID) | SUBCUTANEOUS | Status: DC
  Administered 2014-06-26: 4 [IU] via SUBCUTANEOUS

## 2014-06-26 MED ORDER — HYDROMORPHONE HCL 1 MG/ML IJ SOLN
INTRAMUSCULAR | Status: AC
  Administered 2014-06-26: 0.5 mg via INTRAVENOUS
  Filled 2014-06-26: qty 1

## 2014-06-26 MED ORDER — INSULIN ASPART 100 UNIT/ML ~~LOC~~ SOLN
4.0000 [IU] | Freq: Three times a day (TID) | SUBCUTANEOUS | Status: DC
  Administered 2014-06-26: 4 [IU] via SUBCUTANEOUS

## 2014-06-26 MED ORDER — BUPIVACAINE HCL (PF) 0.5 % IJ SOLN
INTRAMUSCULAR | Status: DC | PRN
  Administered 2014-06-26: 10 mL

## 2014-06-26 MED ORDER — METFORMIN HCL 500 MG PO TABS
500.0000 mg | ORAL_TABLET | Freq: Two times a day (BID) | ORAL | Status: DC
  Administered 2014-06-26 – 2014-06-27 (×2): 500 mg via ORAL
  Filled 2014-06-26 (×4): qty 1

## 2014-06-26 MED ORDER — PANTOPRAZOLE SODIUM 40 MG IV SOLR
40.0000 mg | Freq: Every day | INTRAVENOUS | Status: DC
  Filled 2014-06-26: qty 40

## 2014-06-26 MED ORDER — PANTOPRAZOLE SODIUM 40 MG PO TBEC
40.0000 mg | DELAYED_RELEASE_TABLET | Freq: Every day | ORAL | Status: DC
  Administered 2014-06-26 – 2014-06-27 (×2): 40 mg via ORAL
  Filled 2014-06-26 (×2): qty 1

## 2014-06-26 MED ORDER — FENTANYL CITRATE (PF) 100 MCG/2ML IJ SOLN
INTRAMUSCULAR | Status: DC | PRN
  Administered 2014-06-26: 150 ug via INTRAVENOUS
  Administered 2014-06-26: 100 ug via INTRAVENOUS
  Administered 2014-06-26: 50 ug via INTRAVENOUS
  Administered 2014-06-26: 100 ug via INTRAVENOUS

## 2014-06-26 MED ORDER — ONDANSETRON HCL 4 MG/2ML IJ SOLN: 4.0000 mg | INTRAMUSCULAR | Status: DC | PRN

## 2014-06-26 MED ORDER — HYDROMORPHONE HCL 1 MG/ML IJ SOLN
0.5000 mg | INTRAMUSCULAR | Status: DC | PRN
  Administered 2014-06-26: 1 mg via INTRAVENOUS
  Filled 2014-06-26: qty 1

## 2014-06-26 MED ORDER — PHENYLEPHRINE HCL 10 MG/ML IJ SOLN
INTRAMUSCULAR | Status: DC | PRN
  Administered 2014-06-26: 80 ug via INTRAVENOUS

## 2014-06-26 MED ORDER — THROMBIN 5000 UNITS EX SOLR
CUTANEOUS | Status: DC | PRN
  Administered 2014-06-26 (×4): 5000 [IU] via TOPICAL

## 2014-06-26 MED ORDER — LIDOCAINE HCL (CARDIAC) 20 MG/ML IV SOLN
INTRAVENOUS | Status: AC
  Filled 2014-06-26: qty 5

## 2014-06-26 MED ORDER — CEFAZOLIN SODIUM 1-5 GM-% IV SOLN
INTRAVENOUS | Status: AC
  Administered 2014-06-26: 1000 mg
  Filled 2014-06-26: qty 50

## 2014-06-26 MED ORDER — 0.9 % SODIUM CHLORIDE (POUR BTL) OPTIME
TOPICAL | Status: DC | PRN
  Administered 2014-06-26: 1000 mL

## 2014-06-26 MED ORDER — PROPOFOL 10 MG/ML IV BOLUS
INTRAVENOUS | Status: DC | PRN
  Administered 2014-06-26: 180 mg via INTRAVENOUS
  Administered 2014-06-26 (×2): 20 mg via INTRAVENOUS
  Administered 2014-06-26: 100 mg via INTRAVENOUS
  Administered 2014-06-26: 120 mg via INTRAVENOUS

## 2014-06-26 MED ORDER — GLYCOPYRROLATE 0.2 MG/ML IJ SOLN
INTRAMUSCULAR | Status: AC
  Filled 2014-06-26: qty 1

## 2014-06-26 MED ORDER — LIDOCAINE HCL (CARDIAC) 20 MG/ML IV SOLN
INTRAVENOUS | Status: DC | PRN
  Administered 2014-06-26: 50 mg via INTRAVENOUS

## 2014-06-26 MED ORDER — FENTANYL CITRATE (PF) 250 MCG/5ML IJ SOLN
INTRAMUSCULAR | Status: AC
  Filled 2014-06-26: qty 5

## 2014-06-26 MED ORDER — METHOCARBAMOL 500 MG PO TABS
500.0000 mg | ORAL_TABLET | Freq: Three times a day (TID) | ORAL | Status: DC
  Administered 2014-06-26 – 2014-06-27 (×4): 500 mg via ORAL
  Filled 2014-06-26 (×6): qty 1

## 2014-06-26 MED ORDER — SUCCINYLCHOLINE CHLORIDE 20 MG/ML IJ SOLN
INTRAMUSCULAR | Status: AC
  Filled 2014-06-26: qty 1

## 2014-06-26 MED ORDER — EPHEDRINE SULFATE 50 MG/ML IJ SOLN
INTRAMUSCULAR | Status: AC
  Filled 2014-06-26: qty 1

## 2014-06-26 MED ORDER — PROMETHAZINE HCL 25 MG/ML IJ SOLN: 6.2500 mg | INTRAMUSCULAR | Status: DC | PRN

## 2014-06-26 MED ORDER — PHENOL 1.4 % MT LIQD: 1.0000 | OROMUCOSAL | Status: DC | PRN

## 2014-06-26 MED ORDER — ACETAMINOPHEN 10 MG/ML IV SOLN: 1000.0000 mg | Freq: Once | INTRAVENOUS | Status: DC

## 2014-06-26 MED ORDER — PROPOFOL 10 MG/ML IV BOLUS
INTRAVENOUS | Status: AC
  Filled 2014-06-26: qty 20

## 2014-06-26 MED ORDER — PHENYLEPHRINE 40 MCG/ML (10ML) SYRINGE FOR IV PUSH (FOR BLOOD PRESSURE SUPPORT)
PREFILLED_SYRINGE | INTRAVENOUS | Status: AC
  Filled 2014-06-26: qty 10

## 2014-06-26 MED ORDER — SODIUM CHLORIDE 0.9 % IJ SOLN
3.0000 mL | Freq: Two times a day (BID) | INTRAMUSCULAR | Status: DC
  Administered 2014-06-26 (×2): 3 mL via INTRAVENOUS

## 2014-06-26 MED ORDER — METHOCARBAMOL 500 MG PO TABS
500.0000 mg | ORAL_TABLET | Freq: Four times a day (QID) | ORAL | Status: DC | PRN
  Administered 2014-06-27: 500 mg via ORAL
  Filled 2014-06-26: qty 1

## 2014-06-26 MED ORDER — DEXAMETHASONE SODIUM PHOSPHATE 4 MG/ML IJ SOLN
INTRAMUSCULAR | Status: DC | PRN
  Administered 2014-06-26: 4 mg via INTRAVENOUS

## 2014-06-26 MED ORDER — KETOROLAC TROMETHAMINE 30 MG/ML IJ SOLN
30.0000 mg | Freq: Once | INTRAMUSCULAR | Status: AC | PRN
  Administered 2014-06-26: 30 mg via INTRAVENOUS

## 2014-06-26 MED ORDER — ACETAMINOPHEN 325 MG PO TABS: 650.0000 mg | ORAL_TABLET | ORAL | Status: DC | PRN

## 2014-06-26 MED ORDER — HYDROMORPHONE HCL 1 MG/ML IJ SOLN
0.2500 mg | INTRAMUSCULAR | Status: DC | PRN
  Administered 2014-06-26 (×6): 0.5 mg via INTRAVENOUS

## 2014-06-26 MED ORDER — OXYCODONE-ACETAMINOPHEN 5-325 MG PO TABS
ORAL_TABLET | ORAL | Status: AC
  Administered 2014-06-26: 2 via ORAL
  Filled 2014-06-26: qty 2

## 2014-06-26 MED ORDER — CEFAZOLIN SODIUM-DEXTROSE 2-3 GM-% IV SOLR
2.0000 g | Freq: Three times a day (TID) | INTRAVENOUS | Status: AC
  Administered 2014-06-26 (×2): 2 g via INTRAVENOUS
  Filled 2014-06-26 (×2): qty 50

## 2014-06-26 MED ORDER — OXYCODONE-ACETAMINOPHEN 5-325 MG PO TABS
1.0000 | ORAL_TABLET | ORAL | Status: DC | PRN
  Administered 2014-06-26: 2 via ORAL

## 2014-06-26 MED ORDER — ONDANSETRON HCL 4 MG/2ML IJ SOLN
INTRAMUSCULAR | Status: DC | PRN
  Administered 2014-06-26: 4 mg via INTRAVENOUS

## 2014-06-26 MED ORDER — SODIUM CHLORIDE 0.9 % IJ SOLN: 3.0000 mL | INTRAMUSCULAR | Status: DC | PRN

## 2014-06-26 MED ORDER — ALUM & MAG HYDROXIDE-SIMETH 200-200-20 MG/5ML PO SUSP: 30.0000 mL | Freq: Four times a day (QID) | ORAL | Status: DC | PRN

## 2014-06-26 MED ORDER — BUPIVACAINE LIPOSOME 1.3 % IJ SUSP
20.0000 mL | Freq: Once | INTRAMUSCULAR | Status: AC
  Administered 2014-06-26: 20 mL
  Filled 2014-06-26: qty 20

## 2014-06-26 MED ORDER — KCL IN DEXTROSE-NACL 20-5-0.45 MEQ/L-%-% IV SOLN
INTRAVENOUS | Status: DC
  Filled 2014-06-26 (×3): qty 1000

## 2014-06-26 MED ORDER — MENTHOL 3 MG MT LOZG: 1.0000 | LOZENGE | OROMUCOSAL | Status: DC | PRN

## 2014-06-26 MED ORDER — MIDAZOLAM HCL 2 MG/2ML IJ SOLN
INTRAMUSCULAR | Status: AC
  Filled 2014-06-26: qty 2

## 2014-06-26 MED ORDER — PROMETHAZINE HCL 25 MG/ML IJ SOLN
INTRAMUSCULAR | Status: AC
  Filled 2014-06-26: qty 1

## 2014-06-26 MED ORDER — PROPOFOL INFUSION 10 MG/ML OPTIME
INTRAVENOUS | Status: DC | PRN
  Administered 2014-06-26: 50 ug/kg/min via INTRAVENOUS

## 2014-06-26 MED ORDER — SODIUM CHLORIDE 0.9 % IJ SOLN
INTRAMUSCULAR | Status: AC
  Filled 2014-06-26: qty 10

## 2014-06-26 MED ORDER — HYDROCODONE-ACETAMINOPHEN 5-325 MG PO TABS
1.0000 | ORAL_TABLET | ORAL | Status: DC | PRN
  Administered 2014-06-26 – 2014-06-27 (×5): 2 via ORAL
  Filled 2014-06-26 (×5): qty 2

## 2014-06-26 MED ORDER — BISACODYL 10 MG RE SUPP: 10.0000 mg | Freq: Every day | RECTAL | Status: DC | PRN

## 2014-06-26 MED ORDER — INSULIN ASPART 100 UNIT/ML ~~LOC~~ SOLN: 0.0000 [IU] | Freq: Every day | SUBCUTANEOUS | Status: DC

## 2014-06-26 MED ORDER — SENNOSIDES-DOCUSATE SODIUM 8.6-50 MG PO TABS
1.0000 | ORAL_TABLET | Freq: Every evening | ORAL | Status: DC | PRN
  Filled 2014-06-26: qty 1

## 2014-06-26 MED ORDER — ACETAMINOPHEN 650 MG RE SUPP
650.0000 mg | RECTAL | Status: DC | PRN
Start: 2014-06-26 — End: 2014-06-27

## 2014-06-26 MED ORDER — MEPERIDINE HCL 25 MG/ML IJ SOLN: 6.2500 mg | INTRAMUSCULAR | Status: DC | PRN

## 2014-06-26 SURGICAL SUPPLY — 93 items
ADH SKN CLS APL DERMABOND .7 (GAUZE/BANDAGES/DRESSINGS) ×1
APL SKNCLS STERI-STRIP NONHPOA (GAUZE/BANDAGES/DRESSINGS) ×1
BENZOIN TINCTURE PRP APPL 2/3 (GAUZE/BANDAGES/DRESSINGS) ×3 IMPLANT
BIT DRILL PLIF MAS 5.0MM DISP (DRILL) IMPLANT
BLADE CLIPPER SURG (BLADE) IMPLANT
BONE MATRIX OSTEOCEL PRO MED (Bone Implant) ×2 IMPLANT
BUR MATCHSTICK NEURO 3.0 LAGG (BURR) ×3 IMPLANT
BUR ROUND FLUTED 5 RND (BURR) ×2 IMPLANT
BUR ROUND FLUTED 5MM RND (BURR) ×1
CAGE COROENT MP 8X9X23M-8 SPIN (Cage) ×4 IMPLANT
CANISTER SUCT 3000ML PPV (MISCELLANEOUS) ×3 IMPLANT
CLIP NEUROVISION LG (CLIP) ×2 IMPLANT
CLOSURE WOUND 1/2 X4 (GAUZE/BANDAGES/DRESSINGS) ×1
CONT SPEC 4OZ CLIKSEAL STRL BL (MISCELLANEOUS) ×6 IMPLANT
COVER BACK TABLE 24X17X13 BIG (DRAPES) IMPLANT
COVER BACK TABLE 60X90IN (DRAPES) ×3 IMPLANT
DECANTER SPIKE VIAL GLASS SM (MISCELLANEOUS) ×3 IMPLANT
DERMABOND ADVANCED (GAUZE/BANDAGES/DRESSINGS) ×2
DERMABOND ADVANCED .7 DNX12 (GAUZE/BANDAGES/DRESSINGS) IMPLANT
DRAPE C-ARM 42X72 X-RAY (DRAPES) ×3 IMPLANT
DRAPE C-ARMOR (DRAPES) ×3 IMPLANT
DRAPE LAPAROTOMY 100X72X124 (DRAPES) ×3 IMPLANT
DRAPE POUCH INSTRU U-SHP 10X18 (DRAPES) ×3 IMPLANT
DRAPE SURG 17X23 STRL (DRAPES) ×3 IMPLANT
DRILL PLIF MAS 5.0MM DISP (DRILL) ×3
DRSG OPSITE POSTOP 4X6 (GAUZE/BANDAGES/DRESSINGS) ×2 IMPLANT
DRSG TELFA 3X8 NADH (GAUZE/BANDAGES/DRESSINGS) ×3 IMPLANT
DURAPREP 26ML APPLICATOR (WOUND CARE) ×3 IMPLANT
ELECT BLADE 4.0 EZ CLEAN MEGAD (MISCELLANEOUS) ×3
ELECT REM PT RETURN 9FT ADLT (ELECTROSURGICAL) ×3
ELECTRODE BLDE 4.0 EZ CLN MEGD (MISCELLANEOUS) IMPLANT
ELECTRODE REM PT RTRN 9FT ADLT (ELECTROSURGICAL) ×1 IMPLANT
EVACUATOR 1/8 PVC DRAIN (DRAIN) ×3 IMPLANT
GAUZE SPONGE 4X4 12PLY STRL (GAUZE/BANDAGES/DRESSINGS) ×3 IMPLANT
GAUZE SPONGE 4X4 16PLY XRAY LF (GAUZE/BANDAGES/DRESSINGS) IMPLANT
GLOVE BIO SURGEON STRL SZ8 (GLOVE) ×3 IMPLANT
GLOVE BIOGEL PI IND STRL 8 (GLOVE) ×1 IMPLANT
GLOVE BIOGEL PI IND STRL 8.5 (GLOVE) ×1 IMPLANT
GLOVE BIOGEL PI INDICATOR 8 (GLOVE) ×2
GLOVE BIOGEL PI INDICATOR 8.5 (GLOVE) ×2
GLOVE ECLIPSE 8.0 STRL XLNG CF (GLOVE) ×3 IMPLANT
GLOVE EXAM NITRILE LRG STRL (GLOVE) IMPLANT
GLOVE EXAM NITRILE MD LF STRL (GLOVE) IMPLANT
GLOVE EXAM NITRILE XL STR (GLOVE) IMPLANT
GLOVE EXAM NITRILE XS STR PU (GLOVE) IMPLANT
GOWN STRL REUS W/ TWL LRG LVL3 (GOWN DISPOSABLE) IMPLANT
GOWN STRL REUS W/ TWL XL LVL3 (GOWN DISPOSABLE) ×1 IMPLANT
GOWN STRL REUS W/TWL 2XL LVL3 (GOWN DISPOSABLE) ×3 IMPLANT
GOWN STRL REUS W/TWL LRG LVL3 (GOWN DISPOSABLE)
GOWN STRL REUS W/TWL XL LVL3 (GOWN DISPOSABLE) ×3
KIT BASIN OR (CUSTOM PROCEDURE TRAY) ×3 IMPLANT
KIT NDL NVM5 EMG ELECT (KITS) IMPLANT
KIT NEEDLE NVM5 EMG ELECT (KITS) ×1 IMPLANT
KIT NEEDLE NVM5 EMG ELECTRODE (KITS) ×2
KIT POSITION SURG JACKSON T1 (MISCELLANEOUS) ×3 IMPLANT
KIT ROOM TURNOVER OR (KITS) ×3 IMPLANT
LIQUID BAND (GAUZE/BANDAGES/DRESSINGS) ×3 IMPLANT
MILL MEDIUM DISP (BLADE) ×3 IMPLANT
NDL HYPO 21X1.5 SAFETY (NEEDLE) IMPLANT
NDL HYPO 25X1 1.5 SAFETY (NEEDLE) ×1 IMPLANT
NDL SPNL 18GX3.5 QUINCKE PK (NEEDLE) IMPLANT
NEEDLE HYPO 21X1.5 SAFETY (NEEDLE) ×3 IMPLANT
NEEDLE HYPO 25X1 1.5 SAFETY (NEEDLE) ×3 IMPLANT
NEEDLE SPNL 18GX3.5 QUINCKE PK (NEEDLE) IMPLANT
NS IRRIG 1000ML POUR BTL (IV SOLUTION) ×3 IMPLANT
PACK LAMINECTOMY NEURO (CUSTOM PROCEDURE TRAY) ×3 IMPLANT
PAD ARMBOARD 7.5X6 YLW CONV (MISCELLANEOUS) ×9 IMPLANT
PAD DRESSING TELFA 3X8 NADH (GAUZE/BANDAGES/DRESSINGS) ×1 IMPLANT
PATTIES SURGICAL .5 X.5 (GAUZE/BANDAGES/DRESSINGS) IMPLANT
PATTIES SURGICAL .5 X1 (DISPOSABLE) IMPLANT
PATTIES SURGICAL 1X1 (DISPOSABLE) IMPLANT
ROD 35MM (Rod) ×4 IMPLANT
SCREW LOCK (Screw) ×12 IMPLANT
SCREW LOCK FXNS SPNE MAS PL (Screw) IMPLANT
SCREW SHANK 5.0X30MM (Screw) ×4 IMPLANT
SCREW SHANKS 5.5X35 (Screw) ×4 IMPLANT
SCREW TULIP 5.5 (Screw) ×4 IMPLANT
SPONGE LAP 4X18 X RAY DECT (DISPOSABLE) IMPLANT
SPONGE SURGIFOAM ABS GEL 100 (HEMOSTASIS) ×3 IMPLANT
STAPLER SKIN PROX WIDE 3.9 (STAPLE) IMPLANT
STRIP CLOSURE SKIN 1/2X4 (GAUZE/BANDAGES/DRESSINGS) ×2 IMPLANT
SUT VIC AB 1 CT1 18XBRD ANBCTR (SUTURE) ×2 IMPLANT
SUT VIC AB 1 CT1 8-18 (SUTURE) ×6
SUT VIC AB 2-0 CT1 18 (SUTURE) ×6 IMPLANT
SUT VIC AB 3-0 SH 8-18 (SUTURE) ×6 IMPLANT
SYR 20CC LL (SYRINGE) ×2 IMPLANT
SYR 20ML ECCENTRIC (SYRINGE) ×3 IMPLANT
SYR 5ML LL (SYRINGE) IMPLANT
TOWEL OR 17X24 6PK STRL BLUE (TOWEL DISPOSABLE) ×3 IMPLANT
TOWEL OR 17X26 10 PK STRL BLUE (TOWEL DISPOSABLE) ×3 IMPLANT
TRAP SPECIMEN MUCOUS 40CC (MISCELLANEOUS) ×3 IMPLANT
TRAY FOLEY CATH 14FRSI W/METER (CATHETERS) ×3 IMPLANT
WATER STERILE IRR 1000ML POUR (IV SOLUTION) ×3 IMPLANT

## 2014-06-26 NOTE — Evaluation (Signed)
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  Pt currently limited functionally due to the problems listed below.  (see problems list.)  Pt will benefit from PT to maximize function and safety to be able to get home safely with available assist of family.     Follow Up Recommendations No PT follow up;Supervision for mobility/OOB    Equipment Recommendations   (TBA)    Recommendations for Other Services       Precautions / Restrictions Precautions Precautions: Back Required Braces or Orthoses: Spinal Brace Spinal Brace: Applied in supine position;Lumbar corset      Mobility  Bed Mobility Overal bed mobility: Needs Assistance Bed Mobility: Rolling;Sidelying to Sit Rolling: Min assist Sidelying to sit: Min assist       General bed mobility comments: educated on log roll and transition side to/from sit  Transfers Overall transfer level: Needs assistance   Transfers: Sit to/from Stand Sit to Stand: Min assist         General transfer comment: cues for hand placement  Ambulation/Gait Ambulation/Gait assistance: Min guard Ambulation Distance (Feet): 100 Feet Assistive device: None Gait Pattern/deviations: Step-through pattern Gait velocity: slower   General Gait Details: generally steady, but guarded  Stairs Stairs: Yes          Wheelchair Mobility    Modified Rankin (Stroke Patients Only)       Balance Overall balance assessment: No apparent balance deficits (not formally assessed)                                           Pertinent Vitals/Pain Pain Assessment:  Faces Faces Pain Scale: Hurts even more Pain Location: back Pain Descriptors / Indicators: Aching Pain Intervention(s): Monitored during session;Repositioned    Home Living Family/patient expects to be discharged to:: Private residence Living Arrangements: Spouse/significant other;Children Available Help at Discharge: Family Type of Home: Mobile home Home Access: Stairs to enter Entrance Stairs-Rails: Psychiatric nurse of Steps: 5 Home Layout: One level Home Equipment: None;Cane - single point      Prior Function Level of Independence: Independent               Hand Dominance        Extremity/Trunk Assessment   Upper Extremity Assessment: Defer to OT evaluation           Lower Extremity Assessment: Overall WFL for tasks assessed (R Le weakness > L LE)         Communication   Communication: No difficulties  Cognition Arousal/Alertness: Awake/alert Behavior During Therapy: WFL for tasks assessed/performed Overall Cognitive Status: Within Functional Limits for tasks assessed                      General Comments General comments (skin integrity, edema, etc.): Educated on back care/prec, log roll, lifting precautions, donning brace, progression of activity.    Exercises        Assessment/Plan    PT Assessment Patient needs continued PT services  PT Diagnosis Acute pain;Difficulty walking   PT Problem List Decreased  strength;Decreased activity tolerance;Decreased mobility;Decreased knowledge of use of DME;Decreased knowledge of precautions;Pain  PT Treatment Interventions DME instruction;Gait training;Stair training;Functional mobility training;Therapeutic activities;Patient/family education   PT Goals (Current goals can be found in the Care Plan section) Acute Rehab PT Goals Patient Stated Goal: independent PT Goal Formulation: With patient Time For Goal Achievement: 07/03/14 Potential to Achieve Goals: Good    Frequency  Min 5X/week   Barriers to discharge        Co-evaluation               End of Session Equipment Utilized During Treatment: Back brace Activity Tolerance: Patient tolerated treatment well Patient left: in chair;with call bell/phone within reach;with family/visitor present Nurse Communication: Mobility status         Time: 9432-7614 PT Time Calculation (min) (ACUTE ONLY): 26 min   Charges:   PT Evaluation $Initial PT Evaluation Tier I: 1 Procedure PT Treatments $Gait Training: 8-22 mins   PT G Codes:        Pamela Torres, Tessie Fass 06/26/2014, 6:25 PM  06/26/2014  Pamela Torres, PT [phone]  (pager)

## 2014-06-26 NOTE — Op Note (Signed)
06/26/2014  10:35 AM  PATIENT:  Pamela Torres  49 y.o. 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  Decompression greater than for standard PLIF procedure.  SURGEON:  Surgeon(s) and Role:    * Erline Levine, MD - Primary  PHYSICIAN ASSISTANT: Kritzer, MD  ASSISTANTS: Poteat, RN   ANESTHESIA:   general  EBL:  Total I/O In: 2000 [I.V.:2000] Out: 250 [Urine:150; Blood:100]  BLOOD ADMINISTERED:none  DRAINS: none   LOCAL MEDICATIONS USED:  MARCAINE    and LIDOCAINE   SPECIMEN:  No Specimen  DISPOSITION OF SPECIMEN:  N/A  COUNTS:  YES  TOURNIQUET:  * No tourniquets in log *  DICTATION: Patient is a 49 year old woman with third time recurrent disc herniation L 5 S 1 level, who is morbidly obese, with spondylosis , stenosis, spondylolisthesis, disc degeneration and severe back and right lower extremity pain at L 5 S 1 level of the lumbar spine. It was elected to take her to surgery for MASPLIF L 5 S 1 level with posterolateral arthrodesis with redo discectomy.  Procedure:   Following uncomplicated induction of GETA, and placement of electrodes for neural monitoring, patient was turned into a prone position on the [place] tableand using AP  fluoroscopy the area of planned incision was marked, prepped with betadine scrub and Duraprep, then draped. Exposure was performed of facet joint complex at L 5 S 1 level and the MAS retractor was placed. 5.0 x 30 mm cortical Nuvasive screws were  placed at L 5 bilaterally according to standard landmarks using neural monitoring.  A total laminectomy of L 5 was then performed with disarticulation of facets with redo decompression of the previously operated right side.  This bone was saved for grafting, combined with Osteocel after being run through bone mill and was placed in bone packing device.  Thorough discectomy was performed bilaterally at L 5 S1 and the endplates were prepared for grafting.  Microdissection technique was utilized with painstaking removal of multiple fragments of herniated disc material using a variety of ball probes and dissectors.  Decompression of all neural elements, including bilateral L 5 and S 1 nerve roots was performed.  Dissection was greater than standard PLIF procedure.  23 x 8 x 8 degree cages were placed in the interspace and positioning was confirmed with AP and lateral fluoroscopy.  10 cc of autograft/Osteocel was packed in the interspace medial to the second cage.   Remaining screws (5.5 x 35 mm) were placed at L 5 and 35 mm rods were placed.   And the screws were locked and torqued .Final Xrays showed well positioned implants and screw fixation. The posterolateral region was packed with remaining autograft on the right of midline. The wounds were irrigated and then closed with 1, 2-0 and 3-0 Vicryl stitches. Long acting marcaine was infiltrated into muscular tissues.  Sterile occlusive dressing was placed with Dermabond and an occlusive dressing. The patient was then extubated in the operating room and taken to recovery in stable and satisfactory condition having tolerated her operation well. Counts were correct at the end of the case.  PLAN OF CARE: Admit to inpatient   PATIENT DISPOSITION:  PACU - hemodynamically stable.   Delay start of Pharmacological VTE agent (>24hrs) due to surgical blood loss or risk of bleeding: yes   

## 2014-06-26 NOTE — Transfer of Care (Signed)
Immediate Anesthesia Transfer of Care Note  Patient: Pamela Torres  Procedure(s) Performed: Procedure(s): Lumbar five sacral one Maximum Access Posterior Lumbar Interbody Fusion (N/A)  Patient Location: PACU  Anesthesia Type:General  Level of Consciousness: awake, oriented and patient cooperative  Airway & Oxygen Therapy: Patient Spontanous Breathing and Patient connected to face mask oxygen  Post-op Assessment: Report given to RN and Post -op Vital signs reviewed and stable  Post vital signs: Reviewed  Last Vitals:  Filed Vitals:   06/26/14 0641  BP:   Pulse:   Temp: 36.4 C  Resp:     Complications: No apparent anesthesia complications

## 2014-06-26 NOTE — Interval H&P Note (Signed)
History and Physical Interval Note:  06/26/2014 7:21 AM  Pamela Torres  has presented today for surgery, with the diagnosis of herniated nucleus pulposus, Lumbar; Radiculopathy, Lumbar region; Spondylosis of the lumbasacral region, without myelopathy  The various methods of treatment have been discussed with the patient and family. After consideration of risks, benefits and other options for treatment, the patient has consented to  Procedure(s) with comments: L5-S1 Maximum Access Posterior Lumbar Interbody Fusion (N/A) - L5-S1 Maximum Access Posterior Lumbar Interbody Fusion as a surgical intervention .  The patient's history has been reviewed, patient examined, no change in status, stable for surgery.  I have reviewed the patient's chart and labs.  Questions were answered to the patient's satisfaction.     Oday Ridings D

## 2014-06-26 NOTE — Anesthesia Preprocedure Evaluation (Addendum)
Anesthesia Evaluation  Patient identified by MRN, date of birth, ID band Patient awake    Reviewed: Allergy & Precautions, H&P , NPO status , Patient's Chart, lab work & pertinent test results  History of Anesthesia Complications Negative for: history of anesthetic complications  Airway Mallampati: II  TM Distance: >3 FB Neck ROM: Full    Dental  (+) Teeth Intact, Dental Advisory Given   Pulmonary neg pulmonary ROS, sleep apnea ,  breath sounds clear to auscultation        Cardiovascular negative cardio ROS  Rhythm:Regular Rate:Normal     Neuro/Psych  Headaches, Right sided leg numbness.  Neuromuscular disease negative psych ROS   GI/Hepatic negative GI ROS, Neg liver ROS,   Endo/Other  diabetes, Poorly Controlled, Type 2, Oral Hypoglycemic AgentsMorbid obesity  Renal/GU Renal disease  negative genitourinary   Musculoskeletal negative musculoskeletal ROS (+) Arthritis -,   Abdominal (+) + obese,   Peds  Hematology negative hematology ROS (+) anemia ,   Anesthesia Other Findings   Reproductive/Obstetrics negative OB ROS                           Anesthesia Physical  Anesthesia Plan  ASA: III  Anesthesia Plan: General   Post-op Pain Management:    Induction: Intravenous  Airway Management Planned: Oral ETT  Additional Equipment: None  Intra-op Plan:   Post-operative Plan: Extubation in OR  Informed Consent: I have reviewed the patients History and Physical, chart, labs and discussed the procedure including the risks, benefits and alternatives for the proposed anesthesia with the patient or authorized representative who has indicated his/her understanding and acceptance.   Dental advisory given  Plan Discussed with: CRNA  Anesthesia Plan Comments:        Anesthesia Quick Evaluation

## 2014-06-26 NOTE — OR Nursing (Signed)
Nuvasive needle electrodes placed after induction to lower extremity and upper lower trunk

## 2014-06-26 NOTE — Anesthesia Postprocedure Evaluation (Signed)
Anesthesia Post Note  Patient: Pamela Torres  Procedure(s) Performed: Procedure(s) (LRB): Lumbar five sacral one Maximum Access Posterior Lumbar Interbody Fusion (N/A)  Anesthesia type: General  Patient location: PACU  Post pain: Pain level controlled  Post assessment: Post-op Vital signs reviewed  Last Vitals: BP 152/65 mmHg  Pulse 79  Temp(Src) 36.8 C  Resp 18  SpO2 96%  Post vital signs: Reviewed  Level of consciousness: sedated  Complications: No apparent anesthesia complications

## 2014-06-26 NOTE — Anesthesia Procedure Notes (Signed)
Procedure Name: Intubation Date/Time: 06/26/2014 7:34 AM Performed by: Jenne Campus Pre-anesthesia Checklist: Emergency Drugs available, Patient identified, Suction available, Patient being monitored and Timeout performed Patient Re-evaluated:Patient Re-evaluated prior to inductionOxygen Delivery Method: Circle system utilized Preoxygenation: Pre-oxygenation with 100% oxygen Intubation Type: IV induction Ventilation: Mask ventilation without difficulty Laryngoscope Size: Miller and 2 Grade View: Grade I Tube type: Oral Tube size: 7.0 mm Number of attempts: 1 Airway Equipment and Method: Stylet and LTA kit utilized Placement Confirmation: ETT inserted through vocal cords under direct vision,  positive ETCO2,  CO2 detector and breath sounds checked- equal and bilateral Secured at: 21 cm Tube secured with: Tape Dental Injury: Teeth and Oropharynx as per pre-operative assessment  Comments: Soft bite block utilized.

## 2014-06-26 NOTE — Brief Op Note (Signed)
06/26/2014  10:35 AM  PATIENT:  Pamela Torres  48 y.o. 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  Decompression greater than for standard PLIF procedure.  SURGEON:  Surgeon(s) and Role:    * Erline Levine, MD - Primary  PHYSICIAN ASSISTANT: Kritzer, MD  ASSISTANTS: Poteat, RN   ANESTHESIA:   general  EBL:  Total I/O In: 2000 [I.V.:2000] Out: 250 [Urine:150; Blood:100]  BLOOD ADMINISTERED:none  DRAINS: none   LOCAL MEDICATIONS USED:  MARCAINE    and LIDOCAINE   SPECIMEN:  No Specimen  DISPOSITION OF SPECIMEN:  N/A  COUNTS:  YES  TOURNIQUET:  * No tourniquets in log *  DICTATION: Patient is a 49 year old woman with third time recurrent disc herniation L 5 S 1 level, who is morbidly obese, with spondylosis , stenosis, spondylolisthesis, disc degeneration and severe back and right lower extremity pain at L 5 S 1 level of the lumbar spine. It was elected to take her to surgery for MASPLIF L 5 S 1 level with posterolateral arthrodesis with redo discectomy.  Procedure:   Following uncomplicated induction of GETA, and placement of electrodes for neural monitoring, patient was turned into a prone position on the [place] tableand using AP  fluoroscopy the area of planned incision was marked, prepped with betadine scrub and Duraprep, then draped. Exposure was performed of facet joint complex at L 5 S 1 level and the MAS retractor was placed. 5.0 x 30 mm cortical Nuvasive screws were  placed at L 5 bilaterally according to standard landmarks using neural monitoring.  A total laminectomy of L 5 was then performed with disarticulation of facets with redo decompression of the previously operated right side.  This bone was saved for grafting, combined with Osteocel after being run through bone mill and was placed in bone packing device.  Thorough discectomy was performed bilaterally at L 5 S1 and the endplates were prepared for grafting.  Microdissection technique was utilized with painstaking removal of multiple fragments of herniated disc material using a variety of ball probes and dissectors.  Decompression of all neural elements, including bilateral L 5 and S 1 nerve roots was performed.  Dissection was greater than standard PLIF procedure.  23 x 8 x 8 degree cages were placed in the interspace and positioning was confirmed with AP and lateral fluoroscopy.  10 cc of autograft/Osteocel was packed in the interspace medial to the second cage.   Remaining screws (5.5 x 35 mm) were placed at L 5 and 35 mm rods were placed.   And the screws were locked and torqued .Final Xrays showed well positioned implants and screw fixation. The posterolateral region was packed with remaining autograft on the right of midline. The wounds were irrigated and then closed with 1, 2-0 and 3-0 Vicryl stitches. Long acting marcaine was infiltrated into muscular tissues.  Sterile occlusive dressing was placed with Dermabond and an occlusive dressing. The patient was then extubated in the operating room and taken to recovery in stable and satisfactory condition having tolerated her operation well. Counts were correct at the end of the case.  PLAN OF CARE: Admit to inpatient   PATIENT DISPOSITION:  PACU - hemodynamically stable.   Delay start of Pharmacological VTE agent (>24hrs) due to surgical blood loss or risk of bleeding: yes   

## 2014-06-26 NOTE — Progress Notes (Signed)
Utilization review completed.  

## 2014-06-26 NOTE — H&P (Signed)
Patient ID:   [id number] Patient: Pamela Torres  Date of Birth: Nov 29, 1965 Visit Type: Office Visit   Date: 06/04/2014 12:45 PM Provider: Marchia Meiers. Vertell Limber MD   This 49 year old female presents for back pain.  History of Present Illness: 1.  back pain  Patient returns to review her MRI  Patient's MRI shows a recurrent disc herniation at L5-S1 on the right.  This is a large recurrent disc herniation and this represents the third disc recurrence at this level.  She underwent discectomy at L5-S1 on the right and 12/14 and again in 10/15 is now had a recurrent disc rupture with significant nerve root compression and degenerative changes at this level.  Because of the patient's large body habitus and her frequent recurrent disc herniations, I have recommended to the patient that she undergo redo discectomy with fusion at the L5-S1 level.  This will be done via MAS PLIF technique.      Medical/Surgical/Interim History Reviewed, no change.  Last detailed document date:03/11/2013.   PAST MEDICAL HISTORY, SURGICAL HISTORY, FAMILY HISTORY, SOCIAL HISTORY AND REVIEW OF SYSTEMS I have reviewed the patient's past medical, surgical, family and social history as well as the comprehensive review of systems as included on the [facility] history form dated 05/06/2014, which I have signed.  Family History: Reviewed, no changes.  Last detailed document: 03/11/2013.   Social History: Tobacco use reviewed. Reviewed, no changes. Last detailed document date: 03/11/2013.      MEDICATIONS(added, continued or stopped this visit): Started Medication Directions Instruction Stopped   glipizide 5 mg tablet take 1 tablet by oral route  every day before meals    05/29/2014 ibuprofen 800 mg tablet take 1 tablet by oral route up to 3 times per day with food prn pain    04/30/2014 Medrol (Pak) 4 mg tablets in a dose pack take by Oral route as directed     metformin 500 mg tablet take  1 tablet by oral route 2 times every day with morning and evening meals    04/30/2014 Percocet 5 mg-325 mg tablet take 1 tablet by oral route 3 times every day as needed    05/29/2014 Robaxin 500 mg tablet take 1 tablet by oral route 3 times every day prn spasm       ALLERGIES:    Vitals Date Temp F BP Pulse Ht In Wt Lb BMI BSA Pain Score  06/04/2014  141/83 82 62 238.8 43.68  7/10      IMPRESSION Third disc recurrence L5-S1 level.  I have recommended redo discectomy with fusion at L5-S1.  Completed Orders (this encounter) Order Details Reason Side Interpretation Result Initial Treatment Date Region  Lifestyle education regarding diet Encouraged to eat a well balanced diet and follow up with primary care physician.        Hypertension education Continue to monitor blood pressure.  If blood pressure remains elevated, contact your primary care physician         Assessment/Plan # Detail Type Description   1. Assessment Radiculopathy, lumbar region (M54.16).       2. Assessment Herniated nucleus pulposus, lumbar (M51.26).       3. Assessment Low back pain, unspecified back pain laterality, with sciatica presence unspecified (M54.5).       4. Assessment Spondylosis of lumbosacral region without myelopathy or radiculopathy (M47.817).       5. Assessment Body mass index (BMI) 40.0-44.9, adult (Z68.41).   Plan Orders Today's instructions /  counseling include(s) Lifestyle education regarding diet.       6. Assessment Elevated blood-pressure reading, w/o diagnosis of htn (R03.0).         Pain Assessment/Treatment Pain Scale: 7/10. Method: Numeric Pain Intensity Scale. Location: back. Onset: 09/16/2013. Duration: varies. Quality: discomforting. Pain Assessment/Treatment follow-up plan of care: Patient taking medications as prescribed..  Fall Risk Plan The patient has not fallen in the last year.  MES PLIF L5-S1 06/26/14.  Risks and benefits were discussed with the patient  and she wishes to proceed with surgery.  Orders: Diagnostic Procedures: Assessment Procedure  M51.26 MAS PLIF - L5-S1  Instruction(s)/Education: Assessment Instruction  R03.0 Hypertension education  Z68.41 Lifestyle education regarding diet             Provider:  Marchia Meiers. Vertell Limber MD  06/13/2014 08:14 AM Dictation edited by: Marchia Meiers. [facility]    CC Providers: [facility] [place], [place] [address]-              Electronically signed by Marchia Meiers Vertell Limber MD on 06/13/2014 08:14 AM

## 2014-06-26 NOTE — Plan of Care (Signed)
Problem: Consults Goal: Diagnosis - Spinal Surgery Outcome: Completed/Met Date Met:  06/26/14 [place]/Lumbar Spine Fusion

## 2014-06-27 ENCOUNTER — Encounter (HOSPITAL_COMMUNITY): Payer: Self-pay | Admitting: Neurosurgery

## 2014-06-27 LAB — GLUCOSE, CAPILLARY: GLUCOSE-CAPILLARY: 115 mg/dL — AB (ref 70–99)

## 2014-06-27 NOTE — Progress Notes (Signed)
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  Follow Up Recommendations  No PT follow up;Supervision for mobility/OOB     Equipment Recommendations       Recommendations for Other Services       Precautions / Restrictions Precautions Precautions: Back Required Braces or Orthoses: Spinal Brace Spinal Brace: Applied in sitting position;Lumbar corset    Mobility  Bed Mobility Overal bed mobility: Needs Assistance             General bed mobility comments: reinforced bed mobility  Transfers Overall transfer level: Needs assistance Equipment used: None Transfers: Sit to/from Stand Sit to Stand: Supervision         General transfer comment: cues for hand placement  Ambulation/Gait Ambulation/Gait assistance: Supervision Ambulation Distance (Feet): 150 Feet Assistive device: None Gait Pattern/deviations: Step-through pattern Gait velocity: slower   General Gait Details: generally steady, but guarded   Stairs            Wheelchair Mobility    Modified Rankin (Stroke Patients Only)       Balance                                    Cognition Arousal/Alertness: Awake/alert Behavior During Therapy: WFL for tasks assessed/performed Overall Cognitive Status: Within Functional Limits for tasks assessed                      Exercises      General Comments General comments (skin integrity, edema, etc.): Reinforced back care/prec, log roll, lifting precautions, donning brace, progression of activity.      Pertinent  Vitals/Pain Pain Assessment: Faces Pain Score: 3  Faces Pain Scale: Hurts little more Pain Location: back Pain Descriptors / Indicators: Aching;Sore Pain Intervention(s): Monitored during session    Home Living Family/patient expects to be discharged to:: Private residence Living Arrangements: Spouse/significant other Available Help at Discharge: Family;Available 24 hours/day Type of Home: Mobile home Home Access: Stairs to enter Entrance Stairs-Rails: Right;Left Home Layout: One level Home Equipment: Cane - single point (access to her mom's 3n1)      Prior Function Level of Independence: Independent          PT Goals (current goals can now be found in the care plan section) Acute Rehab PT Goals Patient Stated Goal: home today PT Goal Formulation: With patient Time For Goal Achievement: 07/03/14 Potential to Achieve Goals: Good Progress towards PT goals: Progressing toward goals    Frequency  Min 5X/week    PT Plan Current plan remains appropriate    Co-evaluation             End of Session Equipment Utilized During Treatment: Back brace Activity Tolerance: Patient tolerated treatment well Patient left: Other (comment) (up walking in the room)     Time: 4098-1191 PT Time Calculation (min) (ACUTE ONLY): 20 min  Charges:  $Self Care/Home Management: 8-22                    G Codes:  Parv Manthey, Tessie Fass 06/27/2014, 12:37 PM 06/27/2014  Donnella Sham, PT [phone]  (pager)

## 2014-06-27 NOTE — Progress Notes (Signed)
Subjective: Patient reports "I feel ok . I walked a few times. This leg still hurts some though"  Objective: Vital signs in last 24 hours: Temp:  [98 F (36.7 C)-98.6 F (37 C)] 98.1 F (36.7 C) (04/29 0400) Pulse Rate:  [72-81] 72 (04/29 0400) Resp:  [13-22] 18 (04/29 0400) BP: (101-156)/(31-88) 123/48 mmHg (04/29 0400) SpO2:  [95 %-100 %] 97 % (04/29 0400)  Intake/Output from previous day: 04/28 0701 - 04/29 0700 In: 2000 [I.V.:2000] Out: 1000 [Urine:900; Blood:100] Intake/Output this shift:    Alert, conversant. RLE pain and some right foot numbness persists as expected. Strength is full bilaterally. Incision is flat , without erythema or drainage. Honeycomb drsg intact.   Lab Results: No results for input(s): WBC, HGB, HCT, PLT in the last 72 hours. BMET No results for input(s): NA, K, CL, CO2, GLUCOSE, BUN, CREATININE, CALCIUM in the last 72 hours.  Studies/Results: Dg Lumbar Spine 2-3 Views  06/26/2014   CLINICAL DATA:  L5-S1 fusion  EXAM: DG C-ARM 61-120 MIN; LUMBAR SPINE - 2-3 VIEW  COMPARISON:  MRI 05/22/2014  FLUOROSCOPY TIME:  Radiation Exposure Index (as provided by the fluoroscopic device):  If the device does not provide the exposure index:  Fluoroscopy Time:  1 minutes 1 second  Number of Acquired Images:  None  FINDINGS: Two intraoperative spot images demonstrate placement of pedicle screws at L5 and S1. No visible hardware or bony complicating feature.  IMPRESSION: Posterior fusion L5-S1.  No visible complicating feature.   Electronically Signed   By: Rolm Baptise M.D.   On: 06/26/2014 10:31   Dg C-arm 1-60 Min  06/26/2014   CLINICAL DATA:  L5-S1 fusion  EXAM: DG C-ARM 61-120 MIN; LUMBAR SPINE - 2-3 VIEW  COMPARISON:  MRI 05/22/2014  FLUOROSCOPY TIME:  Radiation Exposure Index (as provided by the fluoroscopic device):  If the device does not provide the exposure index:  Fluoroscopy Time:  1 minutes 1 second  Number of Acquired Images:  None  FINDINGS: Two  intraoperative spot images demonstrate placement of pedicle screws at L5 and S1. No visible hardware or bony complicating feature.  IMPRESSION: Posterior fusion L5-S1.  No visible complicating feature.   Electronically Signed   By: Rolm Baptise M.D.   On: 06/26/2014 10:31    Assessment/Plan: Improving    LOS: 1 day  Per DrStern, d/c iv, d/c to home (after working with PT on stairs). Norco 10/325 rx to chart. Pt has muscle relaxer at home for prn use. She has appt for office f/u scheduled. She verbalizes understanding of d/c instructions.   Verdis Prime 06/27/2014, 8:03 AM

## 2014-06-27 NOTE — Discharge Instructions (Signed)
Wound Care Leave incision open to air. You [place] shower. Do not scrub directly on incision.  Do not put any creams, lotions, or ointments on incision. Activity Walk each and every day, increasing distance each day. No lifting greater than 5 lbs.  Avoid bending, arching, and twisting. No driving for 2 weeks; Wall[place] ride as a passenger locally. If provided with back brace, wear when out of bed.  It is not necessary to wear in bed. Diet Resume your normal diet.  Return to Work Will be discussed at you follow up appointment. Call Your Doctor If Any of These Occur Redness, drainage, or swelling at the wound.  Temperature greater than 101 degrees. Severe pain not relieved by pain medication. Incision starts to come apart. Follow Up Appt Call today for appointment in 3-4 weeks ([phone]) or for problems.  If you have any hardware placed in your spine, you will need an x-ray before your appointment.  

## 2014-06-27 NOTE — Progress Notes (Signed)
Patient alert and oriented, mae's well, voiding adequate amount of urine, swallowing without difficulty, c/o moderate pain and meds given prior to discharged. Patient discharged home with family. Script and discharged instructions given to patient. Patient and family stated understanding of instructions given.  

## 2014-06-27 NOTE — Discharge Summary (Signed)
Physician Discharge Summary  Patient ID: Pamela Torres MRN: [id number] DOB/AGE: [birth year] 49 y.o.  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  Decompression greater than for standard PLIF procedure.  Active Problems:   Herniated lumbar intervertebral disc   Discharged Condition: good  Hospital Course: Pamela Torres was admitted for surgery with dx recurrent HNP, spondylosis and radiculopathy. Following uncomplicated redo decompression and MAS PLIF L5-S1, she recovered nicely and transferred to 3500 for nursing care and therapy.   Consults: None  Significant Diagnostic Studies: radiology: X-Ray: intra-operative  Treatments: surgery: Lumbar five sacral one Maximum Access Posterior Lumbar Interbody Fusion (N/A) with redo discectomy, PEEK cages, pedicle screw fixation, posterolateral arthrodesis.  Decompression greater than for standard PLIF procedure.   Discharge Exam: Blood pressure 123/48, pulse 72, temperature 98.1 F (36.7 C), temperature source Oral, resp. rate 18, SpO2 97 %. Alert, conversant. RLE pain and some right foot numbness persists as expected. Strength is full bilaterally. Incision is flat , without erythema or drainage. Honeycomb drsg intact.    Disposition: 01-Home or Self Care  Norco 10/325 rx to chart. Pt has muscle relaxer at home for prn use. She has appt for office f/u scheduled. She verbalizes understanding of d/c instructions. She agrees to avoid Ibuprofen &  other NSAIDS x12 weeks post-op.      Medication List    ASK your doctor about these medications        ibuprofen 800 MG tablet  Commonly known as:  ADVIL,MOTRIN  Take 800 mg by mouth 3 (three) times daily.     metFORMIN 500 MG tablet  Commonly known as:  GLUCOPHAGE  Take 500 mg by mouth 2 (two) times daily.     methocarbamol 500 MG tablet  Commonly known as:  ROBAXIN  Take 500 mg by mouth 3 (three) times daily.     oxyCODONE-acetaminophen 5-325 MG per tablet  Commonly known as:  PERCOCET/ROXICET  Take 1 tablet by mouth 3 (three) times daily as needed for severe pain.         Signed: Verdis Prime 06/27/2014, 8:06 AM

## 2014-06-27 NOTE — Evaluation (Signed)
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 Acute OT will sign off.    Follow Up Recommendations  No OT follow up    Equipment Recommendations  None recommended by OT       Precautions / Restrictions Precautions Precautions: Back Required Braces or Orthoses: Spinal Brace Spinal Brace: Applied in sitting position;Lumbar corset      Mobility Bed Mobility Overal bed mobility: Needs Assistance             General bed mobility comments: Pt up walking in hallway when I arrive  Transfers Overall transfer level: Needs assistance Equipment used: None Transfers: Sit to/from Stand Sit to Stand: Supervision                   ADL Overall ADL's : Needs assistance/impaired Eating/Feeding: Independent;Sitting   Grooming: Set up;Supervision/safety;Sitting   Upper Body Bathing: Supervision/ safety;Set up;Sitting   Lower Body Bathing: Supervison/ safety;Set up;With adaptive equipment;Sit to/from stand   Upper Body Dressing : Supervision/safety;Set up;Sitting   Lower Body Dressing: Set up;Supervision/safety;With adaptive equipment;Sit to/from stand   Toilet Transfer: Supervision/safety;Comfort height toilet;Grab bars;Ambulation   Toileting- Clothing Manipulation and Hygiene: Modified independent;With adaptive equipment;Sit to/from stand               Vision Additional Comments: No change from baseline          Pertinent Vitals/Pain Pain Assessment:  0-10 Pain Score: 3  Faces Pain Scale: Hurts little more Pain Location: back Pain Descriptors / Indicators: Aching;Sore Pain Intervention(s): Monitored during session     Hand Dominance Right   Extremity/Trunk Assessment Upper Extremity Assessment Upper Extremity Assessment: Overall WFL for tasks assessed           Communication Communication Communication: No difficulties   Cognition Arousal/Alertness: Awake/alert Behavior During Therapy: WFL for tasks assessed/performed Overall Cognitive Status: Within Functional Limits for tasks assessed                                Home Living Family/patient expects to be discharged to:: Private residence Living Arrangements: Spouse/significant other Available Help at Discharge: Family;Available 24 hours/day Type of Home: Mobile home Home Access: Stairs to enter Entrance Stairs-Number of Steps: 5 Entrance Stairs-Rails: Right;Left Home Layout: One level     Bathroom Shower/Tub: Walk-in Hydrologist: Standard     Home Equipment: Cane - single point (access to her mom's 3n1)          Prior Functioning/Environment Level of Independence: Independent             OT Diagnosis: Generalized weakness;Acute pain         OT Goals(Current goals can be found in the care plan section) Acute Rehab OT Goals Patient Stated Goal: home today  OT Frequency:               End of Session Equipment Utilized During Treatment: Back brace  Activity Tolerance: Patient tolerated treatment well Patient left:  with family/visitor present (standing in her room)   Time: 7035-0093 OT Time Calculation (min): 21 min Charges:  OT General Charges $OT Visit: 1 Procedure OT Evaluation $Initial OT Evaluation Tier I: 1 Procedure  Almon Register [phone] 06/27/2014, 11:02 AM

## 2015-08-25 ENCOUNTER — Other Ambulatory Visit: Payer: Self-pay | Admitting: Neurosurgery

## 2015-08-25 DIAGNOSIS — M5416 Radiculopathy, lumbar region: Secondary | ICD-10-CM

## 2015-08-31 ENCOUNTER — Ambulatory Visit (HOSPITAL_COMMUNITY)
Admission: RE | Admit: 2015-08-31 | Discharge: 2015-08-31 | Disposition: A | Discharge status: Home / Self Care | Payer: Medicare Other | Source: Ambulatory Visit | Attending: Neurosurgery | Admitting: Neurosurgery

## 2015-08-31 DIAGNOSIS — M5116 Intervertebral disc disorders with radiculopathy, lumbar region: Secondary | ICD-10-CM | POA: Insufficient documentation

## 2015-08-31 DIAGNOSIS — M8938 Hypertrophy of bone, other site: Secondary | ICD-10-CM | POA: Insufficient documentation

## 2015-08-31 DIAGNOSIS — M5416 Radiculopathy, lumbar region: Secondary | ICD-10-CM

## 2015-08-31 DIAGNOSIS — M4806 Spinal stenosis, lumbar region: Secondary | ICD-10-CM | POA: Diagnosis not present

## 2015-08-31 MED ORDER — GADOBENATE DIMEGLUMINE 529 MG/ML IV SOLN
20.0000 mL | Freq: Once | INTRAVENOUS | Status: AC | PRN
  Administered 2015-08-31: 20 mL via INTRAVENOUS

## 2016-02-24 ENCOUNTER — Encounter: Payer: Self-pay | Admitting: Emergency Medicine

## 2016-02-24 ENCOUNTER — Emergency Department: Payer: Medicare HMO

## 2016-02-24 ENCOUNTER — Emergency Department
Admission: EM | Admit: 2016-02-24 | Discharge: 2016-02-24 | Disposition: A | Discharge status: Home / Self Care | Payer: Medicare HMO | Attending: Emergency Medicine | Admitting: Emergency Medicine

## 2016-02-24 DIAGNOSIS — Z7984 Long term (current) use of oral hypoglycemic drugs: Secondary | ICD-10-CM | POA: Insufficient documentation

## 2016-02-24 DIAGNOSIS — N390 Urinary tract infection, site not specified: Secondary | ICD-10-CM | POA: Diagnosis not present

## 2016-02-24 DIAGNOSIS — E119 Type 2 diabetes mellitus without complications: Secondary | ICD-10-CM | POA: Diagnosis not present

## 2016-02-24 DIAGNOSIS — R109 Unspecified abdominal pain: Secondary | ICD-10-CM

## 2016-02-24 LAB — URINALYSIS, ROUTINE W REFLEX MICROSCOPIC
Bilirubin Urine: NEGATIVE
Glucose, UA: NEGATIVE mg/dL
Ketones, ur: NEGATIVE mg/dL
NITRITE: NEGATIVE
PROTEIN: NEGATIVE mg/dL
Specific Gravity, Urine: 1.008 (ref 1.005–1.030)
pH: 5 (ref 5.0–8.0)

## 2016-02-24 LAB — COMPREHENSIVE METABOLIC PANEL
ALK PHOS: 56 U/L (ref 38–126)
ALT: 22 U/L (ref 14–54)
ANION GAP: 10 (ref 5–15)
AST: 28 U/L (ref 15–41)
Albumin: 4.4 g/dL (ref 3.5–5.0)
BILIRUBIN TOTAL: 0.8 mg/dL (ref 0.3–1.2)
BUN: 13 mg/dL (ref 6–20)
CO2: 23 mmol/L (ref 22–32)
CREATININE: 0.76 mg/dL (ref 0.44–1.00)
Calcium: 9.5 mg/dL (ref 8.9–10.3)
Chloride: 108 mmol/L (ref 101–111)
GFR calc Af Amer: >60 mL/min (ref >60)
GFR calc non Af Amer: >60 mL/min (ref >60)
GLUCOSE: 104 mg/dL — AB (ref 65–99)
Potassium: 4.1 mmol/L (ref 3.5–5.1)
SODIUM: 141 mmol/L (ref 135–145)
TOTAL PROTEIN: 7.9 g/dL (ref 6.5–8.1)

## 2016-02-24 LAB — CBC
HEMATOCRIT: 44.1 % (ref 35.0–47.0)
HEMOGLOBIN: 14.6 g/dL (ref 12.0–16.0)
MCH: 28.7 pg (ref 26.0–34.0)
MCHC: 33.1 g/dL (ref 32.0–36.0)
MCV: 86.6 fL (ref 80.0–100.0)
Platelets: 279 10*3/uL (ref 150–440)
RBC: 5.09 MIL/uL (ref 3.80–5.20)
RDW: 13.6 % (ref 11.5–14.5)
WBC: 14.7 10*3/uL — AB (ref 3.6–11.0)

## 2016-02-24 LAB — LIPASE, BLOOD: Lipase: 18 U/L (ref 11–51)

## 2016-02-24 MED ORDER — MORPHINE SULFATE (PF) 4 MG/ML IV SOLN
4.0000 mg | Freq: Once | INTRAVENOUS | Status: AC
  Administered 2016-02-24: 4 mg via INTRAVENOUS

## 2016-02-24 MED ORDER — ONDANSETRON HCL 4 MG/2ML IJ SOLN
INTRAMUSCULAR | Status: AC
  Filled 2016-02-24: qty 2

## 2016-02-24 MED ORDER — ONDANSETRON 4 MG PO TBDP: 4.0000 mg | ORAL_TABLET | Freq: Three times a day (TID) | ORAL | 0 refills | Status: DC | PRN

## 2016-02-24 MED ORDER — MORPHINE SULFATE (PF) 4 MG/ML IV SOLN
INTRAVENOUS | Status: AC
  Administered 2016-02-24: 4 mg via INTRAVENOUS
  Filled 2016-02-24: qty 1

## 2016-02-24 MED ORDER — PROMETHAZINE HCL 25 MG/ML IJ SOLN
12.5000 mg | Freq: Once | INTRAMUSCULAR | Status: AC
  Administered 2016-02-24: 12.5 mg via INTRAVENOUS
  Filled 2016-02-24: qty 1

## 2016-02-24 MED ORDER — CEFTRIAXONE SODIUM-DEXTROSE 1-3.74 GM-% IV SOLR
1.0000 g | Freq: Once | INTRAVENOUS | Status: AC
  Administered 2016-02-24: 1 g via INTRAVENOUS
  Filled 2016-02-24: qty 50

## 2016-02-24 MED ORDER — ONDANSETRON HCL 4 MG/2ML IJ SOLN
INTRAMUSCULAR | Status: AC
  Administered 2016-02-24: 4 mg via INTRAVENOUS
  Filled 2016-02-24: qty 2

## 2016-02-24 MED ORDER — CEFTRIAXONE SODIUM 1 G IJ SOLR: 1.0000 g | Freq: Once | INTRAMUSCULAR | Status: DC

## 2016-02-24 MED ORDER — ONDANSETRON HCL 4 MG/2ML IJ SOLN
4.0000 mg | Freq: Once | INTRAMUSCULAR | Status: AC
Start: 2016-02-24 — End: 2016-02-24
  Administered 2016-02-24: 4 mg via INTRAVENOUS
  Filled 2016-02-24: qty 2

## 2016-02-24 MED ORDER — ONDANSETRON HCL 4 MG/2ML IJ SOLN
4.0000 mg | Freq: Once | INTRAMUSCULAR | Status: AC
  Administered 2016-02-24: 4 mg via INTRAVENOUS

## 2016-02-24 MED ORDER — SODIUM CHLORIDE 0.9 % IV BOLUS (SEPSIS)
1000.0000 mL | Freq: Once | INTRAVENOUS | Status: AC
  Administered 2016-02-24: 1000 mL via INTRAVENOUS

## 2016-02-24 MED ORDER — CEPHALEXIN 500 MG PO CAPS: 500.0000 mg | ORAL_CAPSULE | Freq: Three times a day (TID) | ORAL | 0 refills | Status: DC

## 2016-02-24 NOTE — ED Triage Notes (Addendum)
Pt to ed with c/o left flank and side pain x 3 days.  Pt reports nausea associated with the pain. Pain worse with movement.

## 2016-02-24 NOTE — ED Notes (Signed)
Pt c/o flank pain increasing in pain xfew days. Pt denies any dysuria or hematuria. Pt denies fevers at home. A&Ox4

## 2016-02-24 NOTE — ED Provider Notes (Signed)
[facility] Emergency Department Provider Note  Time seen: 11:23 AM  I have reviewed the triage vital signs and the nursing notes.   HISTORY  Chief Complaint Flank Pain    HPI Pamela Torres is a 50 y.o. female presents to the emergency department with left-sided abdominal pain. According to the patient for the past 3 or 4 days she has been experiencing intermittent sharp left-sided abdominal pain. States it has been constant since last night. States nausea but denies vomiting, denies diarrhea, denies black or bloody stool, denies dysuria or hematuria. Patient states a history of kidney stones in the past which this feels somewhat similar to states the pain is higher in the abdomen this time. Denies any chest pain, diaphoresis or trouble breathing. Denies any recent cough or congestion. States the pain is somewhat worse with movement. Currently describes the pain as moderate sharp located in the left upper quadrant.  Past Medical History:  Diagnosis Date  . Anemia    during pregnancy  . Chronic back pain   . Cyst in hand    on left hand  . Diabetes mellitus without complication (HCC)    Type 2  . DVT (deep vein thrombosis) in pregnancy (HCC)    right leg  . Gout   . Headache    migraines, hasn't had one in "years"  . Kidney stones   . Sciatic pain   . Sleep apnea    uses c-pap  . Wears glasses     Patient Active Problem List   Diagnosis Date Noted  . Herniated lumbar intervertebral disc 12/10/2013    Past Surgical History:  Procedure Laterality Date  . ABDOMINAL HYSTERECTOMY    . BACK SURGERY  2014   microdiskectomy - lumbar  . CARDIAC CATHETERIZATION     01/19/11 [facility]): normal cononary anatomy, EF 64%  . CESAREAN SECTION    . KNEE SURGERY Right    arthroscopy  . LUMBAR LAMINECTOMY/DECOMPRESSION MICRODISCECTOMY Right 12/10/2013   Procedure: Right Lumbar Five to Sacral One Redo Microdiskectomy;  Surgeon: Erline Levine, MD;  Location: [place] NEURO  ORS;  Service: Neurosurgery;  Laterality: Right;  Right L5-S1 Redo Microdiskectomy  . MAXIMUM ACCESS (MAS)POSTERIOR LUMBAR INTERBODY FUSION (PLIF) 1 LEVEL N/A 06/26/2014   Procedure: Lumbar five sacral one Maximum Access Posterior Lumbar Interbody Fusion;  Surgeon: Erline Levine, MD;  Location: [place] NEURO ORS;  Service: Neurosurgery;  Laterality: N/A;  . ROTATOR CUFF REPAIR Right     Prior to Admission medications   Medication Sig Start Date End Date Taking? Authorizing Provider  ibuprofen (ADVIL,MOTRIN) 800 MG tablet Take 800 mg by mouth 3 (three) times daily.     Historical Provider, MD  metFORMIN (GLUCOPHAGE) 500 MG tablet Take 500 mg by mouth 2 (two) times daily.    Historical Provider, MD  methocarbamol (ROBAXIN) 500 MG tablet Take 500 mg by mouth 3 (three) times daily.    Historical Provider, MD  oxyCODONE-acetaminophen (PERCOCET/ROXICET) 5-325 MG per tablet Take 1 tablet by mouth 3 (three) times daily as needed for severe pain.     Historical Provider, MD    No Known Allergies  Family History  Problem Relation Age of Onset  . Thyroid disease Mother   . COPD Mother   . Heart attack Father   . Hypertension Brother   . Diabetes Brother   . Diabetes Brother   . Hypertension Brother   . Diabetes Brother     Social History Social History  Substance Use Topics  .  Smoking status: Never Smoker  . Smokeless tobacco: Never Used  . Alcohol use No    Review of Systems Constitutional: Negative for fever. Cardiovascular: Negative for chest pain. Respiratory: Negative for shortness of breath. Gastrointestinal: Left-sided abdominal pain. Has a for nausea. Negative for vomiting or diarrhea. Genitourinary: Negative for dysuria. Negative hematuria. Neurological: Negative for headache 10-point ROS otherwise negative.  ____________________________________________   PHYSICAL EXAM:  VITAL SIGNS: ED Triage Vitals  Enc Vitals Group     BP --      Pulse Rate 02/24/16 1113 81     Resp  02/24/16 1113 20     Temp 02/24/16 1113 98 F (36.7 C)     Temp Source 02/24/16 1113 Oral     SpO2 02/24/16 1113 97 %     Weight 02/24/16 1114 235 lb (106.6 kg)     Height --      Head Circumference --      Peak Flow --      Pain Score 02/24/16 1114 10     Pain Loc --      Pain Edu? --      Excl. in [place]? --     Constitutional: Alert and oriented. Well appearing and in no distress. Eyes: Normal exam ENT   Head: Normocephalic and atraumatic.   Mouth/Throat: Mucous membranes are moist. Cardiovascular: Normal rate, regular rhythm. No murmur Respiratory: Normal respiratory effort without tachypnea nor retractions. Breath sounds are clear Gastrointestinal: Soft, mild left upper quadrant tenderness palpation, no rebound or guarding. No distention. No CVA tenderness. Musculoskeletal: Nontender with normal range of motion in all extremities Neurologic:  Normal speech and language. No gross focal neurologic deficits Skin:  Skin is warm, dry and intact.  Psychiatric: Mood and affect are normal.  ____________________________________________   RADIOLOGY  CT largely negative.  ____________________________________________   INITIAL IMPRESSION / ASSESSMENT AND PLAN / ED COURSE  Pertinent labs & imaging results that were available during my care of the patient were reviewed by me and considered in my medical decision making (see chart for details).  The patient presents to the emergency department with left-sided abdominal pain for the past 4 days, now it has been constant for the past 24 hours. Patient is nauseated with dry heaves in the emergency department but denies any episodes of vomiting. Denies any diarrhea, black or bloody stool, hematuria or dysuria. We will obtain labs, urinalysis, CT renal scan of the abdomen.  CT shows no acute findings, nephrolithiasis but no ureterolithiasis. Patient's urinalysis has resulted showing significant urinary tract infection with many  bacteria. Given the patient's left flank pain we will treat for likely urinary tract infection possible mild pyelonephritis. We will dose IV Rocephin in the emergency department, sent home on Keflex 3 times a day for 10 days along with nausea medication. Patient states she is feeling much better after medications in the emergency department. ____________________________________________   FINAL CLINICAL IMPRESSION(S) / ED DIAGNOSES  Left flank pain Urinary tract infection   Harvest Dark, MD 02/24/16 1420

## 2016-02-24 NOTE — ED Notes (Signed)
Patient continued to c/o nausea and dry heaving despite several Zofran doses. Patient's discharge papers ready. Phenergan given few mins prior to discharge per Dr. Kerman Passey.

## 2016-02-26 LAB — URINE CULTURE: Culture: >=100000 — AB

## 2016-02-29 DIAGNOSIS — I491 Atrial premature depolarization: Secondary | ICD-10-CM

## 2016-02-29 HISTORY — DX: Atrial premature depolarization: I49.1

## 2016-08-24 ENCOUNTER — Other Ambulatory Visit: Payer: Self-pay | Admitting: Family

## 2016-08-24 DIAGNOSIS — Z1239 Encounter for other screening for malignant neoplasm of breast: Secondary | ICD-10-CM

## 2016-09-19 ENCOUNTER — Ambulatory Visit
Admission: RE | Admit: 2016-09-19 | Discharge: 2016-09-19 | Disposition: A | Discharge status: Home / Self Care | Payer: Medicare HMO | Source: Ambulatory Visit | Attending: Family | Admitting: Family

## 2016-09-19 DIAGNOSIS — Z1231 Encounter for screening mammogram for malignant neoplasm of breast: Secondary | ICD-10-CM | POA: Insufficient documentation

## 2016-09-19 DIAGNOSIS — R928 Other abnormal and inconclusive findings on diagnostic imaging of breast: Secondary | ICD-10-CM | POA: Insufficient documentation

## 2016-09-19 DIAGNOSIS — N6489 Other specified disorders of breast: Secondary | ICD-10-CM | POA: Diagnosis not present

## 2016-09-19 DIAGNOSIS — Z1239 Encounter for other screening for malignant neoplasm of breast: Secondary | ICD-10-CM

## 2016-09-21 ENCOUNTER — Other Ambulatory Visit: Payer: Self-pay | Admitting: Family

## 2016-09-21 DIAGNOSIS — R928 Other abnormal and inconclusive findings on diagnostic imaging of breast: Secondary | ICD-10-CM

## 2016-09-21 DIAGNOSIS — N6489 Other specified disorders of breast: Secondary | ICD-10-CM

## 2016-09-29 ENCOUNTER — Ambulatory Visit
Admission: RE | Admit: 2016-09-29 | Discharge: 2016-09-29 | Disposition: A | Discharge status: Home / Self Care | Payer: Medicare HMO | Source: Ambulatory Visit | Attending: Family | Admitting: Family

## 2016-09-29 DIAGNOSIS — N6489 Other specified disorders of breast: Secondary | ICD-10-CM

## 2016-09-29 DIAGNOSIS — R928 Other abnormal and inconclusive findings on diagnostic imaging of breast: Secondary | ICD-10-CM | POA: Diagnosis present

## 2017-11-02 ENCOUNTER — Other Ambulatory Visit: Payer: Self-pay | Admitting: Family

## 2017-11-02 DIAGNOSIS — Z1231 Encounter for screening mammogram for malignant neoplasm of breast: Secondary | ICD-10-CM

## 2018-04-08 ENCOUNTER — Other Ambulatory Visit: Payer: Self-pay

## 2018-04-08 ENCOUNTER — Encounter (HOSPITAL_COMMUNITY): Payer: Self-pay | Admitting: Emergency Medicine

## 2018-04-08 ENCOUNTER — Emergency Department (HOSPITAL_COMMUNITY)
Admission: EM | Admit: 2018-04-08 | Discharge: 2018-04-08 | Disposition: A | Discharge status: Home / Self Care | Payer: Medicare HMO | Attending: Emergency Medicine | Admitting: Emergency Medicine

## 2018-04-08 ENCOUNTER — Emergency Department (HOSPITAL_COMMUNITY): Payer: Medicare HMO

## 2018-04-08 DIAGNOSIS — Z79899 Other long term (current) drug therapy: Secondary | ICD-10-CM | POA: Diagnosis not present

## 2018-04-08 DIAGNOSIS — Z87442 Personal history of urinary calculi: Secondary | ICD-10-CM | POA: Insufficient documentation

## 2018-04-08 DIAGNOSIS — Z7984 Long term (current) use of oral hypoglycemic drugs: Secondary | ICD-10-CM | POA: Insufficient documentation

## 2018-04-08 DIAGNOSIS — N10 Acute tubulo-interstitial nephritis: Secondary | ICD-10-CM | POA: Insufficient documentation

## 2018-04-08 DIAGNOSIS — R300 Dysuria: Secondary | ICD-10-CM | POA: Insufficient documentation

## 2018-04-08 DIAGNOSIS — R1031 Right lower quadrant pain: Secondary | ICD-10-CM | POA: Diagnosis present

## 2018-04-08 DIAGNOSIS — R10815 Periumbilic abdominal tenderness: Secondary | ICD-10-CM | POA: Diagnosis not present

## 2018-04-08 DIAGNOSIS — N12 Tubulo-interstitial nephritis, not specified as acute or chronic: Secondary | ICD-10-CM

## 2018-04-08 DIAGNOSIS — E119 Type 2 diabetes mellitus without complications: Secondary | ICD-10-CM | POA: Diagnosis not present

## 2018-04-08 LAB — CBC WITH DIFFERENTIAL/PLATELET
Abs Immature Granulocytes: 0.06 10*3/uL (ref 0.00–0.07)
BASOS ABS: 0 10*3/uL (ref 0.0–0.1)
BASOS PCT: 0 %
Eosinophils Absolute: 0.1 10*3/uL (ref 0.0–0.5)
Eosinophils Relative: 2 %
HEMATOCRIT: 39.8 % (ref 36.0–46.0)
HEMOGLOBIN: 12.5 g/dL (ref 12.0–15.0)
Immature Granulocytes: 1 %
LYMPHS ABS: 2.5 10*3/uL (ref 0.7–4.0)
LYMPHS PCT: 32 %
MCH: 27 pg (ref 26.0–34.0)
MCHC: 31.4 g/dL (ref 30.0–36.0)
MCV: 86 fL (ref 80.0–100.0)
MONOS PCT: 11 %
Monocytes Absolute: 0.9 10*3/uL (ref 0.1–1.0)
NEUTROS ABS: 4.1 10*3/uL (ref 1.7–7.7)
NEUTROS PCT: 54 %
Platelets: 252 10*3/uL (ref 150–400)
RBC: 4.63 MIL/uL (ref 3.87–5.11)
RDW: 13.8 % (ref 11.5–15.5)
WBC: 7.7 10*3/uL (ref 4.0–10.5)
nRBC: 0 % (ref 0.0–0.2)

## 2018-04-08 LAB — URINALYSIS, ROUTINE W REFLEX MICROSCOPIC
BILIRUBIN URINE: NEGATIVE
GLUCOSE, UA: NEGATIVE mg/dL
Ketones, ur: NEGATIVE mg/dL
Nitrite: NEGATIVE
PH: 6 (ref 5.0–8.0)
Protein, ur: NEGATIVE mg/dL
RBC / HPF: >50 RBC/hpf — ABNORMAL HIGH (ref 0–5)
SPECIFIC GRAVITY, URINE: 1.015 (ref 1.005–1.030)

## 2018-04-08 LAB — COMPREHENSIVE METABOLIC PANEL
ALT: 20 U/L (ref 0–44)
ANION GAP: 12 (ref 5–15)
AST: 17 U/L (ref 15–41)
Albumin: 3.2 g/dL — ABNORMAL LOW (ref 3.5–5.0)
Alkaline Phosphatase: 54 U/L (ref 38–126)
BUN: 6 mg/dL (ref 6–20)
CHLORIDE: 106 mmol/L (ref 98–111)
CO2: 23 mmol/L (ref 22–32)
Calcium: 8.7 mg/dL — ABNORMAL LOW (ref 8.9–10.3)
Creatinine, Ser: 0.77 mg/dL (ref 0.44–1.00)
GFR calc Af Amer: >60 mL/min (ref >60)
GFR calc non Af Amer: >60 mL/min (ref >60)
Glucose, Bld: 157 mg/dL — ABNORMAL HIGH (ref 70–99)
POTASSIUM: 3.3 mmol/L — AB (ref 3.5–5.1)
Sodium: 141 mmol/L (ref 135–145)
Total Bilirubin: 0.7 mg/dL (ref 0.3–1.2)
Total Protein: 7 g/dL (ref 6.5–8.1)

## 2018-04-08 LAB — LACTIC ACID, PLASMA: LACTIC ACID, VENOUS: 1.6 mmol/L (ref 0.5–1.9)

## 2018-04-08 MED ORDER — ONDANSETRON HCL 4 MG/2ML IJ SOLN
4.0000 mg | Freq: Once | INTRAMUSCULAR | Status: AC
  Administered 2018-04-08: 4 mg via INTRAVENOUS
  Filled 2018-04-08: qty 2

## 2018-04-08 MED ORDER — CEPHALEXIN 500 MG PO CAPS: 500.0000 mg | ORAL_CAPSULE | Freq: Four times a day (QID) | ORAL | 0 refills | Status: DC

## 2018-04-08 MED ORDER — SODIUM CHLORIDE 0.9 % IV BOLUS
1000.0000 mL | Freq: Once | INTRAVENOUS | Status: AC
  Administered 2018-04-08: 1000 mL via INTRAVENOUS

## 2018-04-08 MED ORDER — MORPHINE SULFATE (PF) 4 MG/ML IV SOLN
4.0000 mg | Freq: Once | INTRAVENOUS | Status: AC
  Administered 2018-04-08: 4 mg via INTRAVENOUS
  Filled 2018-04-08: qty 1

## 2018-04-08 MED ORDER — HYDROCODONE-ACETAMINOPHEN 5-325 MG PO TABS: 1.0000 | ORAL_TABLET | Freq: Four times a day (QID) | ORAL | 0 refills | Status: DC | PRN

## 2018-04-08 MED ORDER — SODIUM CHLORIDE 0.9 % IV SOLN: INTRAVENOUS | Status: DC

## 2018-04-08 MED ORDER — SODIUM CHLORIDE 0.9 % IV SOLN
1.0000 g | Freq: Once | INTRAVENOUS | Status: AC
  Administered 2018-04-08: 1 g via INTRAVENOUS
  Filled 2018-04-08: qty 10

## 2018-04-08 NOTE — Discharge Instructions (Signed)
Turn to the ER if your fever returns or you start vomiting and cannot hold down anything.  Top taking the ciprofloxacin antibiotic and start taking the new antibiotic that was prescribed to you.

## 2018-04-08 NOTE — ED Triage Notes (Signed)
Pt complains of UTI symptoms. Pt went to urgent care and received antibiotics on Thursday but haven't helped. Pt reports a fever earlier this week but is no longer there. Pt is nauseous.

## 2018-04-08 NOTE — ED Notes (Signed)
Patient verbalizes understanding of discharge instructions. Opportunity for questioning and answers were provided. Armband removed by staff, pt discharged from ED ambulatory to home.  

## 2018-04-08 NOTE — ED Provider Notes (Signed)
[facility] EMERGENCY DEPARTMENT Provider Note   CSN: [id number] Arrival date & time: 04/08/18  1059     History   Chief Complaint No chief complaint on file.   HPI Pamela Torres is a 53 y.o. female.  Patient is a 53 year old female with a history of diabetes, kidney stones, anemia, chronic back pain who is presenting today with 6 days of persistent symptoms of right flank pain, dysuria, fever and nausea.  Patient states on Tuesday she started to have some frequency and urgency.  Then on Wednesday she started to develop severe pain in her right flank area which has not radiated but is a 7 out of 10.  On Thursday she went to urgent care and at that time was found to have a leukocytosis, evidence of a UTI and was given a shot of Rocephin and started on Cipro.  However on Friday and Saturday she had temperatures up to 102 and 103 despite taking the antibiotics.  She has had ongoing nausea and persistent right flank pain.  She states that the antibiotic has not improved her symptoms at all.  She has not had any chest pain or shortness of breath.  She has had decreased appetite and generally feels unwell.  The history is provided by the patient.    Past Medical History:  Diagnosis Date  . Anemia    during pregnancy  . Chronic back pain   . Cyst in hand    on left hand  . Diabetes mellitus without complication (HCC)    Type 2  . DVT (deep vein thrombosis) in pregnancy    right leg  . Gout   . Headache    migraines, hasn't had one in "years"  . Kidney stones   . Sciatic pain   . Sleep apnea    uses c-pap  . Wears glasses     Patient Active Problem List   Diagnosis Date Noted  . Herniated lumbar intervertebral disc 12/10/2013    Past Surgical History:  Procedure Laterality Date  . ABDOMINAL HYSTERECTOMY    . BACK SURGERY  2014   microdiskectomy - lumbar  . CARDIAC CATHETERIZATION     01/19/11 [facility]): normal cononary anatomy, EF 64%  . CESAREAN SECTION     . KNEE SURGERY Right    arthroscopy  . LUMBAR LAMINECTOMY/DECOMPRESSION MICRODISCECTOMY Right 12/10/2013   Procedure: Right Lumbar Five to Sacral One Redo Microdiskectomy;  Surgeon: Erline Levine, MD;  Location: [place] NEURO ORS;  Service: Neurosurgery;  Laterality: Right;  Right L5-S1 Redo Microdiskectomy  . MAXIMUM ACCESS (MAS)POSTERIOR LUMBAR INTERBODY FUSION (PLIF) 1 LEVEL N/A 06/26/2014   Procedure: Lumbar five sacral one Maximum Access Posterior Lumbar Interbody Fusion;  Surgeon: Erline Levine, MD;  Location: [place] NEURO ORS;  Service: Neurosurgery;  Laterality: N/A;  . ROTATOR CUFF REPAIR Right      OB History    Gravida  3   Para  3   Term  3   Preterm      AB      Living  3     SAB      TAB      Ectopic      Multiple      Live Births               Home Medications    Prior to Admission medications   Medication Sig Start Date End Date Taking? Authorizing Provider  cephALEXin (KEFLEX) 500 MG capsule Take 1 capsule (500  mg total) by mouth 3 (three) times daily. 02/24/16   Harvest Dark, MD  dimenhyDRINATE (DRAMAMINE) 50 MG tablet Take 50 mg by mouth every 8 (eight) hours as needed for nausea.    [provider]  gabapentin (NEURONTIN) 100 MG capsule Take 100-200 mg by mouth daily. Take 1 capsules by mouth in the morning and 2 capsules by mouth at night. 02/17/16   [provider]  glipiZIDE (GLUCOTROL) 5 MG tablet Take 1 tablet by mouth daily. 01/15/16   [provider]  HYDROcodone-acetaminophen (NORCO/VICODIN) 5-325 MG tablet Take 1 tablet by mouth 3 (three) times daily. 01/29/16   [provider]  metFORMIN (GLUCOPHAGE) 500 MG tablet Take 500 mg by mouth 2 (two) times daily.    [provider]  ondansetron (ZOFRAN ODT) 4 MG disintegrating tablet Take 1 tablet (4 mg total) by mouth every 8 (eight) hours as needed for nausea or vomiting. 02/24/16   Harvest Dark, MD  tiZANidine (ZANAFLEX) 2 MG tablet Take 1 tablet  by mouth 3 (three) times daily. 02/17/16   [provider]    Family History Family History  Problem Relation Age of Onset  . Thyroid disease Mother   . COPD Mother   . Heart attack Father   . Hypertension Brother   . Diabetes Brother   . Diabetes Brother   . Hypertension Brother   . Diabetes Brother   . Breast cancer Neg Hx     Social History Social History   Tobacco Use  . Smoking status: Never Smoker  . Smokeless tobacco: Never Used  Substance Use Topics  . Alcohol use: No  . Drug use: No     Allergies   Patient has no known allergies.   Review of Systems Review of Systems  All other systems reviewed and are negative.    Physical Exam Updated Vital Signs BP (!) 164/77 (BP Location: Right Arm)   Pulse 60   Temp (!) 97.1 F (36.2 C) (Oral)   Resp 14   Ht 5\' 3"  (1.6 m)   Wt 100.2 kg   SpO2 100%   BMI 39.15 kg/m   Physical Exam Vitals signs and nursing note reviewed.  Constitutional:      General: She is not in acute distress.    Appearance: She is well-developed.  HENT:     Head: Normocephalic and atraumatic.     Mouth/Throat:     Mouth: Mucous membranes are moist.  Eyes:     Pupils: Pupils are equal, round, and reactive to light.  Cardiovascular:     Rate and Rhythm: Normal rate and regular rhythm.     Heart sounds: Normal heart sounds. No murmur. No friction rub.  Pulmonary:     Effort: Pulmonary effort is normal.     Breath sounds: Normal breath sounds. No wheezing or rales.  Abdominal:     General: Bowel sounds are normal. There is no distension.     Palpations: Abdomen is soft.     Tenderness: There is abdominal tenderness in the suprapubic area. There is right CVA tenderness. There is no guarding or rebound.  Musculoskeletal: Normal range of motion.        General: No tenderness.     Comments: No edema  Skin:    General: Skin is warm and dry.     Findings: No rash.  Neurological:     Mental Status: She is alert and  oriented to person, place, and time. Mental status is at baseline.  Cranial Nerves: No cranial nerve deficit.  Psychiatric:        Mood and Affect: Mood normal.        Behavior: Behavior normal.      ED Treatments / Results  Labs (all labs ordered are listed, but only abnormal results are displayed) Labs Reviewed  COMPREHENSIVE METABOLIC PANEL - Abnormal; Notable for the following components:      Result Value   Potassium 3.3 (*)    Glucose, Bld 157 (*)    Calcium 8.7 (*)    Albumin 3.2 (*)    All other components within normal limits  URINALYSIS, ROUTINE W REFLEX MICROSCOPIC - Abnormal; Notable for the following components:   APPearance HAZY (*)    Hgb urine dipstick LARGE (*)    Leukocytes, UA TRACE (*)    RBC / HPF >50 (*)    Bacteria, UA RARE (*)    All other components within normal limits  URINE CULTURE  CBC WITH DIFFERENTIAL/PLATELET  LACTIC ACID, PLASMA    EKG None  Radiology Ct Renal Stone Study  Result Date: 04/08/2018 CLINICAL DATA:  Right flank pain for 4 days. EXAM: CT ABDOMEN AND PELVIS WITHOUT CONTRAST TECHNIQUE: Multidetector CT imaging of the abdomen and pelvis was performed following the standard protocol without IV contrast. COMPARISON:  CT scan 02/24/2016 FINDINGS: Lower chest: The lung bases are clear of acute process. No pleural effusion or pulmonary lesions. The heart is normal in size. No pericardial effusion. The distal esophagus and aorta are unremarkable. Hepatobiliary: No focal hepatic lesions or intrahepatic biliary dilatation. The gallbladder is normal. No common bile duct dilatation. Pancreas: No mass, inflammation or ductal dilatation. Spleen: Normal size.  No focal lesions. Adrenals/Urinary Tract: The adrenal glands are normal. Bilateral renal calculi but no obstructing ureteral calculi or bladder calculi. No worrisome renal or bladder lesions without contrast. Stomach/Bowel: The stomach, duodenum, small bowel and colon are grossly normal  without oral contrast. No inflammatory changes, mass lesions or obstructive findings. Mild fibrofatty infiltrated type changes involving the right colon suggesting prior inflammatory bowel disease. The terminal ileum and appendix are normal. Vascular/Lymphatic: The aorta is normal in caliber. Scattered atheroscerlotic calcifications. No mesenteric of retroperitoneal mass or adenopathy. Small scattered lymph nodes are noted. Reproductive: Surgically absent. Other: Small periumbilical abdominal wall hernia containing fat. There is also a left paramidline hernia containing fat which is stable. Musculoskeletal: No significant bony findings. Lumbar fusion hardware at M3-N3 without complicating features. 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 IMPRESSION: 1. No acute abdominal/pelvic findings, mass lesions or adenopathy. 2. Bilateral renal calculi but no obstructing ureteral calculi or bladder calculi. 3. Stable anterior abdominal wall hernias. Electronically Signed   By: Marijo Sanes M.D.   On: 04/08/2018 12:01    Procedures Procedures (including critical care time)  Medications Ordered in ED Medications  sodium chloride 0.9 % bolus 1,000 mL (has no administration in time range)  0.9 %  sodium chloride infusion (has no administration in time range)  morphine 4 MG/ML injection 4 mg (has no administration in time range)  ondansetron (ZOFRAN) injection 4 mg (has no administration in time range)     Initial Impression / Assessment and Plan / ED Course  I have reviewed the triage vital signs and the nursing notes.  Pertinent labs & imaging results that were available during my care of the patient were reviewed by me and considered in my medical decision making (see chart for details).     Patient  presenting with symptoms today of either pyelonephritis or an infected renal stone.  Patient did have a dose of Rocephin and has been on Cipro since Thursday but states her symptoms are not improving  and she did have fever over the weekend.  Patient currently is hemodynamically stable but started on IV fluids and will check sepsis labs with lactate, CBC, CMP, UA.  Patient did have a urine culture done at the urgent care however her urine culture is not available at this time but UA did suggest a UTI.  Renal stone study also pending.  Patient given pain and nausea control.  1:54 PM Labs are reassuring with a normal white count and lactic acid.  CMP with normal renal function.  UA with signs of infection but CT negative for an obstructing kidney stone.  Patient feels better after IV fluids and pain control.  Will give rocephin here for pyelo and stop cipro and d/c home with keflex  Final Clinical Impressions(s) / ED Diagnoses   Final diagnoses:  Pyelonephritis    ED Discharge Orders         Ordered    cephALEXin (KEFLEX) 500 MG capsule  4 times daily     04/08/18 1412    HYDROcodone-acetaminophen (NORCO/VICODIN) 5-325 MG tablet  Every 6 hours PRN     04/08/18 1412           Blanchie Dessert, MD 04/08/18 1412

## 2018-04-08 NOTE — ED Notes (Addendum)
EDP at bedside providing Pt update.

## 2018-04-09 LAB — URINE CULTURE: Culture: NO GROWTH

## 2018-04-23 ENCOUNTER — Ambulatory Visit
Admission: RE | Admit: 2018-04-23 | Discharge: 2018-04-23 | Disposition: A | Discharge status: Home / Self Care | Payer: Medicare HMO | Source: Ambulatory Visit | Attending: Family | Admitting: Family

## 2018-04-23 DIAGNOSIS — Z1231 Encounter for screening mammogram for malignant neoplasm of breast: Secondary | ICD-10-CM | POA: Diagnosis not present

## 2019-06-21 ENCOUNTER — Other Ambulatory Visit: Payer: Self-pay | Admitting: Family

## 2019-06-21 DIAGNOSIS — Z1231 Encounter for screening mammogram for malignant neoplasm of breast: Secondary | ICD-10-CM

## 2019-08-08 ENCOUNTER — Ambulatory Visit
Admission: RE | Admit: 2019-08-08 | Discharge: 2019-08-08 | Disposition: A | Discharge status: Home / Self Care | Payer: Medicare HMO | Source: Ambulatory Visit | Attending: Family | Admitting: Family

## 2019-08-08 DIAGNOSIS — Z1231 Encounter for screening mammogram for malignant neoplasm of breast: Secondary | ICD-10-CM | POA: Insufficient documentation

## 2019-11-21 IMAGING — MG DIGITAL SCREENING BILATERAL MAMMOGRAM WITH TOMO AND CAD
8 series · 8 of 24 positions shown · non-contrast
Comparison: Previous exam(s).

CLINICAL DATA: Screening.

EXAM:
DIGITAL SCREENING BILATERAL MAMMOGRAM WITH TOMO AND CAD

[R MLO synth-2D]
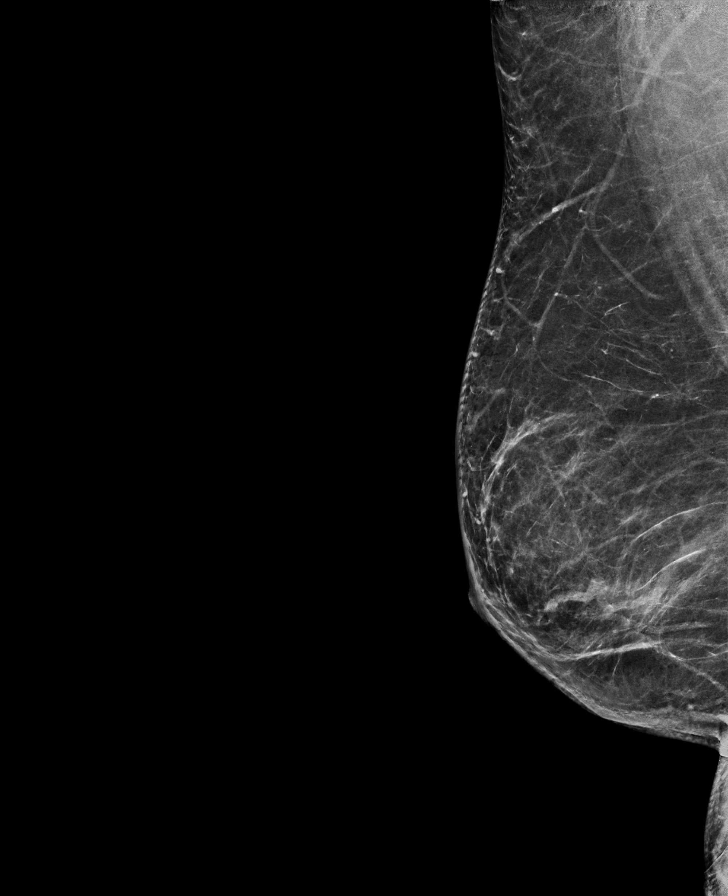

[R CC synth-2D]
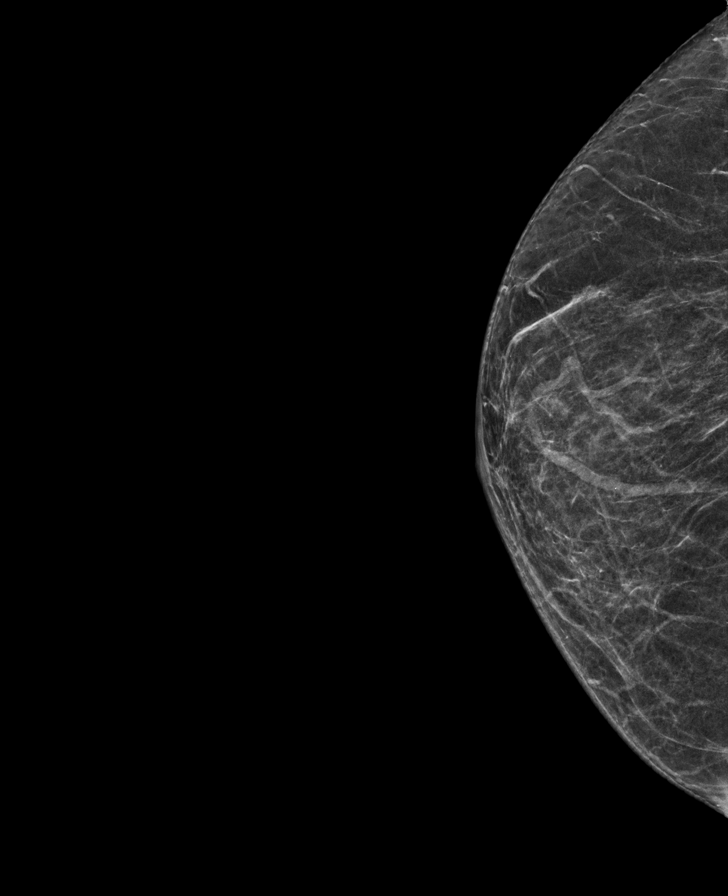

[L CC synth-2D]
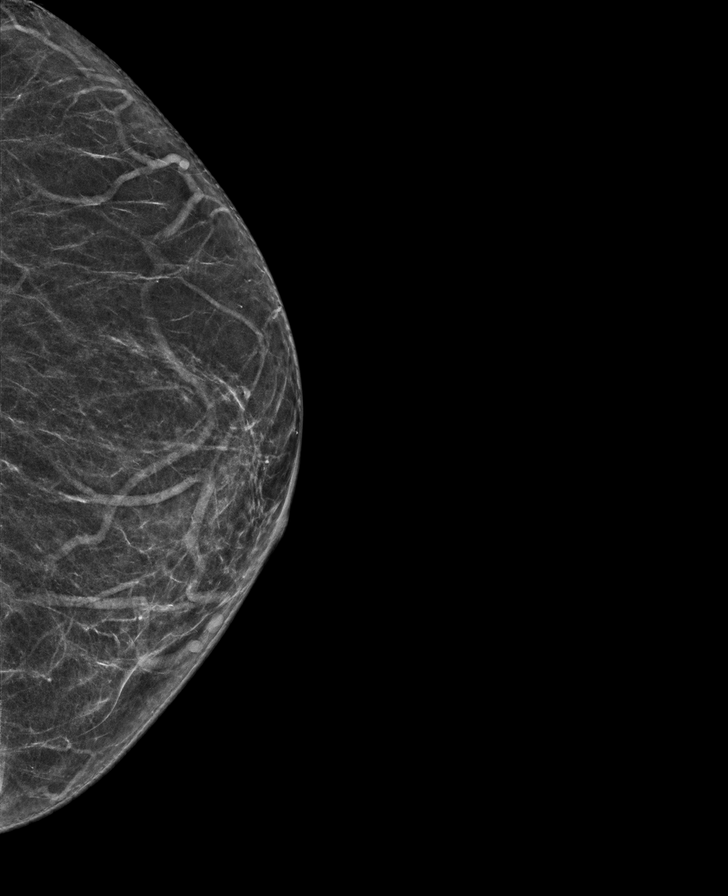

[L MLO synth-2D]
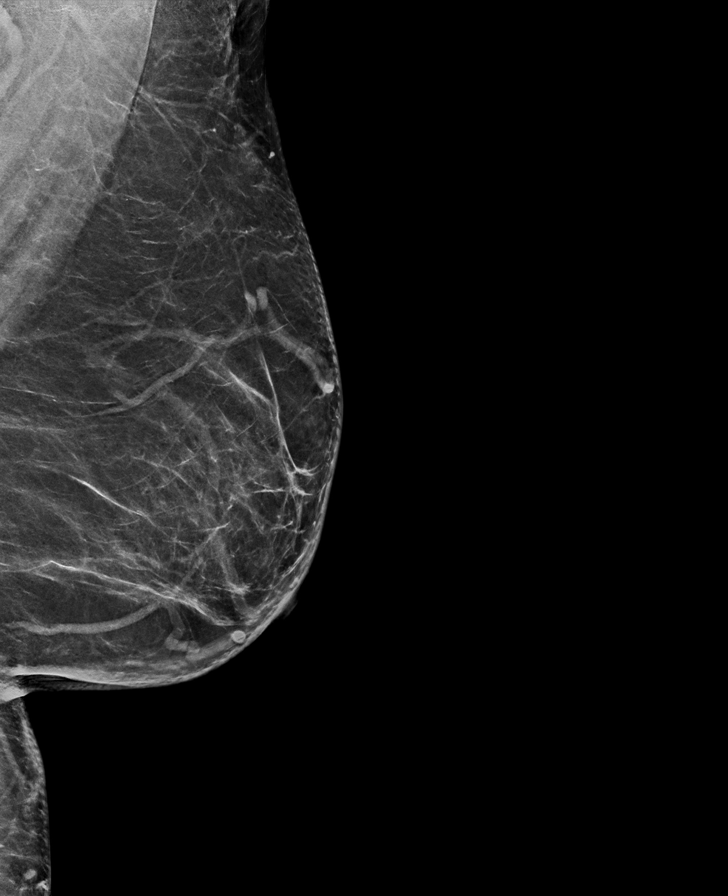

[R CC tomo · tomo slice 29/56.0]
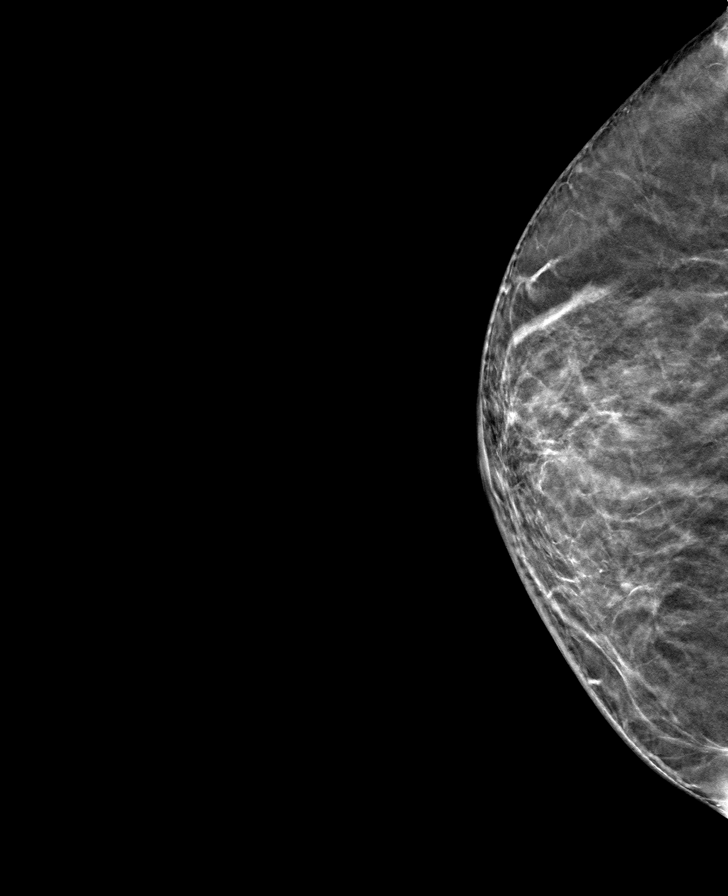

[L CC tomo · tomo slice 31/61.0]
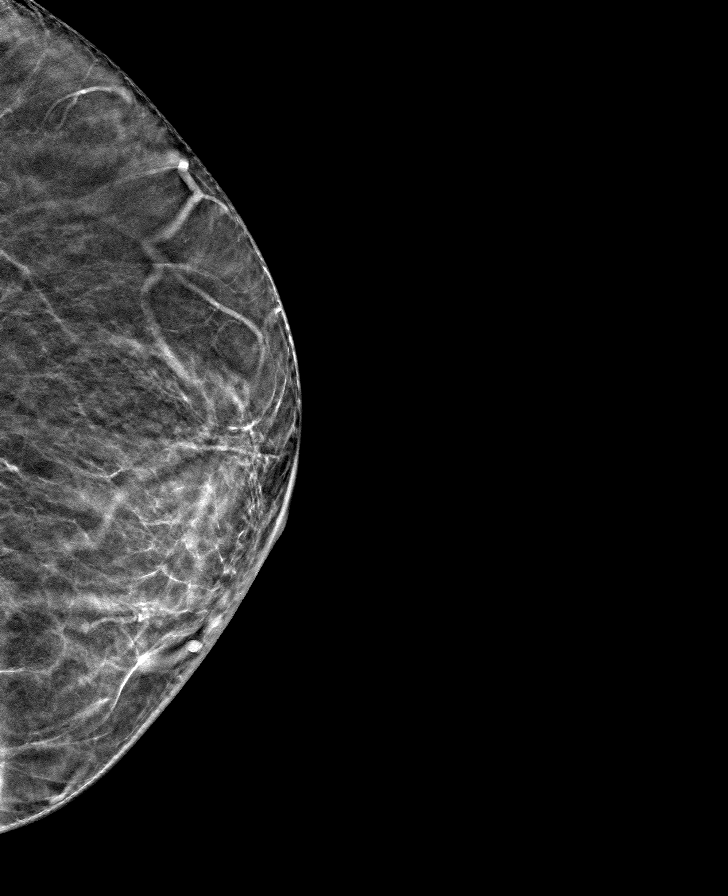

[L MLO tomo · tomo slice 39/76.0]
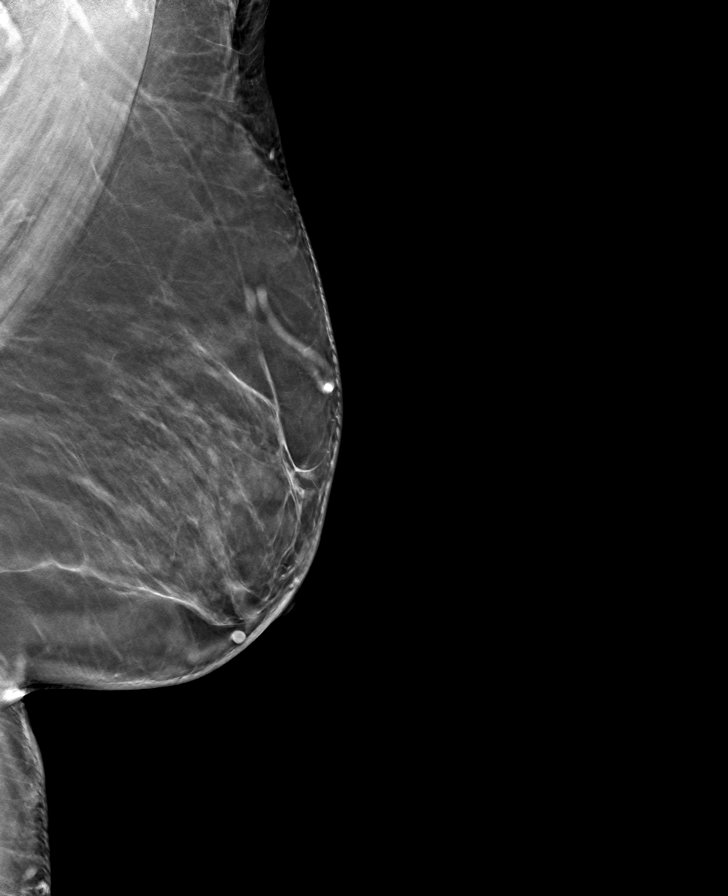

[R MLO tomo · tomo slice 35/69.0]
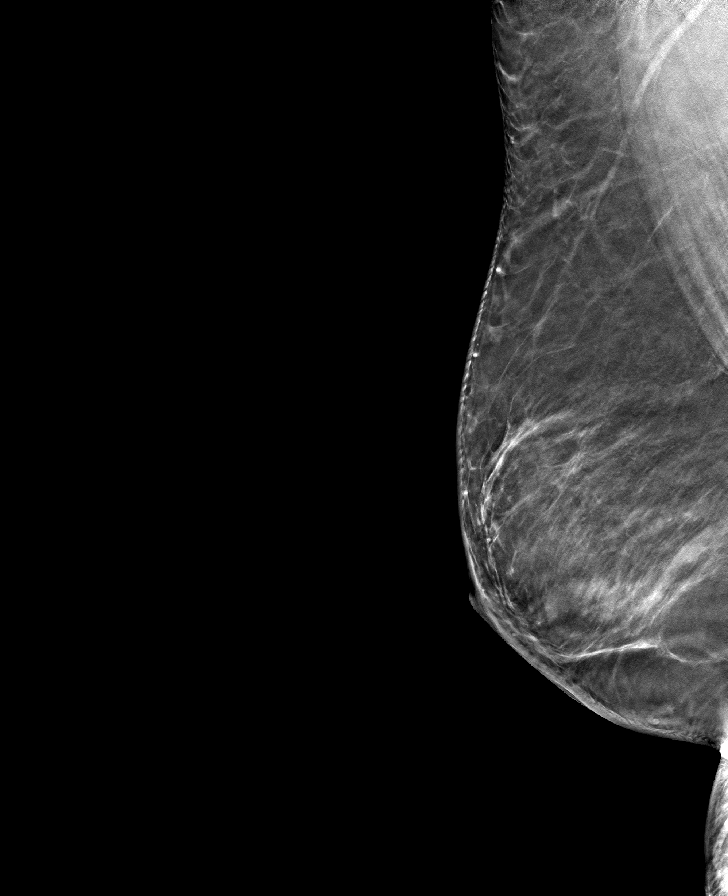

[8 of 24 positions shown; findings below may reference images not displayed]

ACR Breast Density Category b: There are scattered areas of
fibroglandular density.
FINDINGS: There are no findings suspicious for malignancy. Images were
processed with CAD.
IMPRESSION: No mammographic evidence of malignancy. A result letter of this
screening mammogram will be mailed directly to the patient.

RECOMMENDATION:
Screening mammogram in one year. (Code:[id number])

BI-RADS CATEGORY  1: Negative.

## 2020-02-10 ENCOUNTER — Other Ambulatory Visit: Payer: Self-pay | Admitting: Neurosurgery

## 2020-02-10 DIAGNOSIS — M4316 Spondylolisthesis, lumbar region: Secondary | ICD-10-CM

## 2020-03-05 ENCOUNTER — Ambulatory Visit
Admission: RE | Admit: 2020-03-05 | Discharge: 2020-03-05 | Disposition: A | Discharge status: Home / Self Care | Payer: Medicare HMO | Source: Ambulatory Visit | Attending: Neurosurgery | Admitting: Neurosurgery

## 2020-03-05 ENCOUNTER — Other Ambulatory Visit: Payer: Self-pay

## 2020-03-05 DIAGNOSIS — M4316 Spondylolisthesis, lumbar region: Secondary | ICD-10-CM

## 2020-03-05 MED ORDER — GADOBENATE DIMEGLUMINE 529 MG/ML IV SOLN
20.0000 mL | Freq: Once | INTRAVENOUS | Status: AC | PRN
  Administered 2020-03-05: 20 mL via INTRAVENOUS

## 2020-05-20 ENCOUNTER — Ambulatory Visit
Admission: RE | Admit: 2020-05-20 | Discharge: 2020-05-20 | Disposition: A | Discharge status: Home / Self Care | Payer: Medicare HMO | Source: Ambulatory Visit | Attending: Student in an Organized Health Care Education/Training Program | Admitting: Student in an Organized Health Care Education/Training Program

## 2020-05-20 ENCOUNTER — Encounter: Payer: Self-pay | Admitting: Student in an Organized Health Care Education/Training Program

## 2020-05-20 ENCOUNTER — Other Ambulatory Visit: Payer: Self-pay

## 2020-05-20 ENCOUNTER — Ambulatory Visit (HOSPITAL_BASED_OUTPATIENT_CLINIC_OR_DEPARTMENT_OTHER): Payer: Medicare HMO | Admitting: Student in an Organized Health Care Education/Training Program

## 2020-05-20 VITALS — BP 150/81 | HR 68 | Temp 98.0°F | Resp 16 | Ht 63.0 in | Wt 198.0 lb

## 2020-05-20 DIAGNOSIS — Z981 Arthrodesis status: Secondary | ICD-10-CM

## 2020-05-20 DIAGNOSIS — M5416 Radiculopathy, lumbar region: Secondary | ICD-10-CM | POA: Insufficient documentation

## 2020-05-20 DIAGNOSIS — M961 Postlaminectomy syndrome, not elsewhere classified: Secondary | ICD-10-CM | POA: Diagnosis not present

## 2020-05-20 DIAGNOSIS — G8929 Other chronic pain: Secondary | ICD-10-CM

## 2020-05-20 DIAGNOSIS — G894 Chronic pain syndrome: Secondary | ICD-10-CM | POA: Diagnosis present

## 2020-05-20 MED ORDER — PREGABALIN 50 MG PO CAPS: ORAL_CAPSULE | ORAL | 1 refills | Status: DC

## 2020-05-20 NOTE — Progress Notes (Signed)
Patient: Pamela Torres  Service Category: E/M  Provider: Gillis Santa, MD  DOB: [birth year]  DOS: 05/20/2020  Referring Provider: Erline Levine, MD  MRN: [id number]  Setting: Ambulatory outpatient  PCP: Joyice Faster, FNP  Type: New Patient  Specialty: Interventional Pain Management    Location: Office  Delivery: Face-to-face     Primary Reason(s) for Visit: Encounter for initial evaluation of one or more chronic problems (new to examiner) potentially causing chronic pain, and posing a threat to normal musculoskeletal function. (Level of risk: High) CC: No chief complaint on file.  HPI  Pamela Torres is a 55 y.o. year old, female patient, who comes for the first time to our practice referred by Erline Levine, MD for our initial evaluation of her chronic pain. She has Herniated lumbar intervertebral disc; Failed back surgical syndrome; and Lumbar radicular pain on their problem list. Today she comes in for evaluation of her low back and right leg pain.  Pain Assessment: Location: Right Leg (right foot) Radiating: pain starting at right side of right foot and radiates from lower right foot to side of right leg t right side of buttocks Onset: More than a month ago (for a year and half, around mid 2020.) Duration: Chronic pain Quality: Constant,Shooting,Numbness,Pins and needles (electircal) Severity: 3 /10 (subjective, self-reported pain score)  Effect on ADL: Pain worsens at night after being up all day. Unable to lay on my side or my right leg goes numb. I can tolerate with what I can even if it slows me down" Timing: Constant Modifying factors: "Gabapentin helps some but not as much as before. At night resting and relaxing helps some. Laying on back helps" BP: (!) 150/81  HR: 68  Onset and Duration: Gradual and Date of onset: 1 1/2 years ago. Cause of pain: Unknown Severity: Getting worse, NAS-11 at its worse: 10/10, NAS-11 at its best: 8/10, NAS-11 now: 8/10 and NAS-11 on the average:  8/10 Timing: Afternoon and After activity or exercise Aggravating Factors: Prolonged sitting, Prolonged standing and Walking Alleviating Factors: Lying down Associated Problems: Numbness, Spasms, Tingling and Pain that does not allow patient to sleep Quality of Pain: Aching, Annoying, Nagging, Shooting and Tingling Previous Examinations or Tests: MRI scan and Neurological evaluation Previous Treatments: Epidural steroid injections  Pamela Torres is a pleasant 55 year old female with a history of L5-S1 lumbar spinal fusion who presents with a chief complaint of right leg pain related to right L5-S1 radiculopathy.  Patient has tried physical therapy in the past before her spinal fusion as well as after with limited response.  She is currently on gabapentin 300 mg in the morning, 300 mg in the afternoon, 600 mg at night with limited benefit.  The pain is in a dermatomal fashion.  She has had 2 lumbar epidural steroid injections in the last 6 months with no response.  She is being referred here from Dr. Vertell Limber, her neurosurgeon to discuss alternative pain therapies.  Of note patient has a history of depression and is currently on Zoloft 50 mg daily for depression and anxiety.  She is not on chronic opioid therapy.  Historic Controlled Substance Pharmacotherapy Review  Historical Monitoring: The patient  reports no history of drug use. List of all UDS Test(s): No results found for: MDMA, COCAINSCRNUR, [place], [place], CANNABQUANT, THCU, [place] List of other Serum/Urine Drug Screening Test(s):  No results found for: AMPHSCRSER, BARBSCRSER, BENZOSCRSER, COCAINSCRSER, COCAINSCRNUR, PCPSCRSER, PCPQUANT, THCSCRSER, THCU, CANNABQUANT, OPIATESCRSER, OXYSCRSER, PROPOXSCRSER, ETH Historical Background Evaluation: [place] PMP: PDMP  reviewed during this encounter. Online review of the past 80-monthperiod conducted.             [place] Department of public safety, offender search: (Editor, commissioningInformation) Non-contributory Risk  Assessment Profile: Aberrant behavior: None observed or detected today Risk factors for fatal opioid overdose: None identified today Fatal overdose hazard ratio (HR): Calculation deferred Non-fatal overdose hazard ratio (HR): Calculation deferred Risk of opioid abuse or dependence: 0.7-3.0% with doses ? 36 MME/day and 6.1-26% with doses ? 120 MME/day. Substance use disorder (SUD) risk level: See below Personal History of Substance Abuse (SUD-Substance use disorder):  Alcohol: Negative  Illegal Drugs: Negative  Rx Drugs: Negative  ORT Risk Level calculation: Low Risk  Opioid Risk Tool - 05/20/20 0828      Family History of Substance Abuse   Alcohol Negative    Illegal Drugs Negative    Rx Drugs Negative      Personal History of Substance Abuse   Alcohol Negative    Illegal Drugs Negative    Rx Drugs Negative      Age   Age between 181-45years  No      Psychological Disease   Psychological Disease Negative    Depression Positive   Pt on Zoloft     Total Score   Opioid Risk Tool Scoring 1    Opioid Risk Interpretation Low Risk          ORT Scoring interpretation table:  Score <3 = Low Risk for SUD  Score between 4-7 = Moderate Risk for SUD  Score >8 = High Risk for Opioid Abuse   PHQ-2 Depression Scale:  Total score: 1  PHQ-2 Scoring interpretation table: (Score and probability of major depressive disorder)  Score 0 = No depression  Score 1 = 15.4% Probability  Score 2 = 21.1% Probability  Score 3 = 38.4% Probability  Score 4 = 45.5% Probability  Score 5 = 56.4% Probability  Score 6 = 78.6% Probability   PHQ-9 Depression Scale:  Total score: 1  PHQ-9 Scoring interpretation table:  Score 0-4 = No depression  Score 5-9 = Mild depression  Score 10-14 = Moderate depression  Score 15-19 = Moderately severe depression  Score 20-27 = Severe depression (2.4 times higher risk of SUD and 2.89 times higher risk of overuse)   Pharmacologic Plan: As per protocol, I have  not taken over any controlled substance management, pending the results of ordered tests and/or consults.            Initial impression: Pending review of available data and ordered tests.  Meds   Current Outpatient Medications:  .  metFORMIN (GLUCOPHAGE) 500 MG tablet, Take 500 mg by mouth 2 (two) times daily., Disp: , Rfl:  .  pregabalin (LYRICA) 50 MG capsule, 50 mg in morning, 50 mg afternoon, 100 mg qhs, Disp: 120 capsule, Rfl: 1 .  Semaglutide,0.25 or 0.5MG/DOS, (OZEMPIC, 0.25 OR 0.5 MG/DOSE,) 2 MG/1.5ML SOPN, , Disp: , Rfl:  .  sertraline (ZOLOFT) 50 MG tablet, Take 50 mg by mouth daily., Disp: , Rfl:   Imaging Review    Lumbosacral Imaging: Lumbar MR wo contrast: Results for orders placed in visit on 11/10/98  MR Lumbar Spine Wo Contrast  Narrative FINDINGS CLINICAL DATA:    LEFT LEG PAIN AND BACK PAIN SINCE AUGUST OF LAST YEAR. MRI OF THE LUMBOSACRAL SPINE WITHOUT CONTRAST AND PLAIN FILM EXAM: COMPARISON MRI SCAN PERFORMED 06/03/1998. PLAIN FILM EXAMS INCLUDING BILATERAL OBLIQUE VIEWS AND FLEXION AND EXTENSION  VIEWS. THERE ARE FIVE NON-RIB BEARING LUMBAR-TYPE VERTEBRAE IN NORMAL ALIGNMENT.  MINIMAL L5-S1 DISC SPACE NARROWING.  NO EVIDENCE OF ABNORMAL MOTION WITH FLEXION OR EXTENSION.  DEGENERATIVE CHANGES SUPERIOR END PLATE OF H68 WITH ANTERIOR OSTEOPHYTE.  MILD BILATERAL L5-S1 FACET JOINT DEGENERATIVE CHANGES. IMPRESSION  Lumbar MR wo contrast: No valid procedures specified. Lumbar MR w/wo contrast: Results for orders placed during the hospital encounter of 03/05/20  MR Lumbar Spine W Wo Contrast  Narrative CLINICAL DATA:  Leg pain and "electricity" when down leg.  EXAM: MRI LUMBAR SPINE WITHOUT AND WITH CONTRAST  TECHNIQUE: Multiplanar and multiecho pulse sequences of the lumbar spine were obtained without and with intravenous contrast.  CONTRAST:  16m MULTIHANCE GADOBENATE DIMEGLUMINE 529 MG/ML IV SOLN  COMPARISON:  MRI August 31, 2015.  FINDINGS: Segmentation:  Standard  Alignment:  Physiologic.  Vertebrae: Vertebral body heights are maintained. No evidence of acute fracture, discitis/osteomyelitis, or suspicious bone lesion. Similar large L2 venous malformation/hemangioma. L5-S1 PLIF.  Conus medullaris and cauda equina: Conus extends to the T12-L1 level. Conus appears normal.  Paraspinal and other soft tissues: Unremarkable aside from postoperative changes in the posterior paraspinal soft tissues.  Disc levels:  T11-T12: Only imaged on sagittal sequences with similar appearance a right paracentral disc protrusion without gross canal stenosis.  T12-L1: Mild disc bulge without significant canal or foraminal stenosis.  L1-L2: Mild disc bulge without significant canal or foraminal stenosis.  L2-L3: Broad disc bulge with superimposed new small central disc protrusion. Mild facet hypertrophy. No significant canal or foraminal stenosis.  L3-L4: Broad disc bulge and mild facet hypertrophy without significant canal or foraminal stenosis, similar to prior.  L4-L5: Slightly increased broad disc bulge and central protrusion with annular fissure. Similar moderate bilateral facet hypertrophy and pronounced ligamentum flavum thickening. Resulting severe bilateral subarticular recess stenosis and mild to moderate central canal stenosis. No significant foraminal stenosis.  L5-S1: Posterior fusion with similar enhancing scar tissue in the right subarticular recess, surrounding the descending S1 nerve roots. Metallic hardware limits evaluation; however, no evidence of significant canal or foraminal stenosis.  IMPRESSION: 1. Progression of degenerative changes at L4-L5 with severe bilateral subarticular recess stenosis and mild to moderate central canal stenosis. 2. Similar appearance of L5-S1 fusion with scar tissue in the right subarticular recess. No evidence of significant canal or foraminal stenosis at  this level. 3. New small central disc protrusion at L2-L3 without significant canal stenosis.   Electronically Signed By: FMargaretha SheffieldMD On: 03/05/2020 16:46    Narrative CLINICAL DATA:  Sciatic pain.  Redo microdiscectomy.  EXAM: LUMBAR SPINE - 1 VIEW  COMPARISON:  Lumbar spine MRI 10/23/2013 and plain films 09/30/2013  FINDINGS: A posterior retractor is in place. A surgical instrument is seen posterior to the disc space at L5-S1.  IMPRESSION: Intraoperative localization.   Electronically Signed By: BShon HaleM.D. On: 12/10/2013 13:19  Lumbar DG 1V (Clearing): No results found for this or any previous visit.  Lumbar DG 2-3V (Clearing): No results found for this or any previous visit.  Lumbar DG 2-3 views: Results for orders placed during the hospital encounter of 06/26/14  DG Lumbar Spine 2-3 Views  Narrative CLINICAL DATA:  L5-S1 fusion  EXAM: DG C-ARM 61-120 MIN; LUMBAR SPINE - 2-3 VIEW  COMPARISON:  MRI 05/22/2014  FLUOROSCOPY TIME:  Radiation Exposure Index (as provided by the fluoroscopic device):  If the device does not provide the exposure index:  Fluoroscopy Time:  1 minutes 1 second  Number of  Acquired Images:  None  FINDINGS: Two intraoperative spot images demonstrate placement of pedicle screws at L5 and S1. No visible hardware or bony complicating feature.  IMPRESSION: Posterior fusion L5-S1.  No visible complicating feature.   Electronically Signed By: Rolm Baptise M.D. On: 06/26/2014 10:31  Lumbar DG (Complete) 4+V: Results for orders placed during the hospital encounter of 09/30/13  DG Lumbar Spine Complete  Narrative CLINICAL DATA:  Low back pain radiating to right hip for 2 weeks, no injury.  EXAM: LUMBAR SPINE - COMPLETE 4+ VIEW  COMPARISON:  Lumbar spine radiographs January 09, 2013  FINDINGS: Lumbar vertebral bodies appear intact and aligned with maintenance of the lumbar lordosis. Mild chronic T12  wedging with ventral endplate spurring. Mild L5-S1 disc degeneration. Remaining intervertebral disc heights preserved. No pars interarticularis defects. No destructive bony lesions. Mild L5-S1 facet arthropathy. Sacroiliac joints are symmetric. Subcentimeter calcification projecting in lower pole right kidney. Subcentimeter density projecting left mid abdomen Ke[place] be enteric. Phleboliths in the left pelvis.  IMPRESSION: No acute fracture deformity or malalignment. Similar lumbar spondylosis.  Possible right nephrolithiasis.   Electronically Signed By: Elon Alas On: 09/30/2013 17:55  Narrative FINDINGS CLINICAL DATA:    LEFT LEG PAIN AND BACK PAIN SINCE AUGUST OF LAST YEAR. MRI OF THE LUMBOSACRAL SPINE WITHOUT CONTRAST AND PLAIN FILM EXAM: COMPARISON MRI SCAN PERFORMED 06/03/1998. PLAIN FILM EXAMS INCLUDING BILATERAL OBLIQUE VIEWS AND FLEXION AND EXTENSION VIEWS. THERE ARE FIVE NON-RIB BEARING LUMBAR-TYPE VERTEBRAE IN NORMAL ALIGNMENT.  MINIMAL L5-S1 DISC SPACE NARROWING.  NO EVIDENCE OF ABNORMAL MOTION WITH FLEXION OR EXTENSION.  DEGENERATIVE CHANGES SUPERIOR END PLATE OF N82 WITH ANTERIOR OSTEOPHYTE.  MILD BILATERAL L5-S1 FACET JOINT DEGENERATIVE CHANGES. IMPRESSION  Narrative FINDINGS CLINICAL DATA:  BACK PAIN AND BILATERAL LEG PAIN, LEFT WORSE THAN RIGHT. LEFT S-1 NERVE ROOT BLOCK: THE PATIENT WAS MORE IMPROVED AFTER THE LEFT S-1 NERVE ROOT BLOCK THAN THE CAUDAL INJECTION. WE ELECTED TO REPEAT THE NERVE ROOT BLOCK FOR THE NUMBER THREE PROCEDURE. FOLLOWING INFORMED CONSENT, STERILE PREPARATION OF THE BACK, AND ADEQUATE LOCAL ANESTHESIA, A 22 GAUGE SPINAL NEEDLE WAS PLACED THROUGH THE S-1 FORAMEN ON THE LEFT. CONTRAST INJECTION OUTLINED THE EPIDURAL SPACE ABOVE AND BELOW. A TEST INJECTION OF 3 CC OF 1 PERCENT LIDOCAINE YIELDED NO SYMPTOMS. I INJECTED 80 MG OF DEPO-MEDROL, 1 CC OF 1 PERCENT LIDOCAINE AND 1 CC OF 0.25 PERCENT SENSORCAINE. POST-PROCEDURE, THE PATIENT WAS  IMPROVED. IMPRESSION   Complexity Note: Imaging results reviewed. Results shared with Ms. Mages, using Layman's terms.                         ROS  Cardiovascular: No reported cardiovascular signs or symptoms such as High blood pressure, coronary artery disease, abnormal heart rate or rhythm, heart attack, blood thinner therapy or heart weakness and/or failure Pulmonary or Respiratory: No reported pulmonary signs or symptoms such as wheezing and difficulty taking a deep full breath (Asthma), difficulty blowing air out (Emphysema), coughing up mucus (Bronchitis), persistent dry cough, or temporary stoppage of breathing during sleep Neurological: No reported neurological signs or symptoms such as seizures, abnormal skin sensations, urinary and/or fecal incontinence, being born with an abnormal open spine and/or a tethered spinal cord Psychological-Psychiatric: No reported psychological or psychiatric signs or symptoms such as difficulty sleeping, anxiety, depression, delusions or hallucinations (schizophrenial), mood swings (bipolar disorders) or suicidal ideations or attempts Gastrointestinal: No reported gastrointestinal signs or symptoms such as vomiting or evacuating blood, reflux, heartburn, alternating episodes  of diarrhea and constipation, inflamed or scarred liver, or pancreas or irrregular and/or infrequent bowel movements Genitourinary: No reported renal or genitourinary signs or symptoms such as difficulty voiding or producing urine, peeing blood, non-functioning kidney, kidney stones, difficulty emptying the bladder, difficulty controlling the flow of urine, or chronic kidney disease Hematological: No reported hematological signs or symptoms such as prolonged bleeding, low or poor functioning platelets, bruising or bleeding easily, hereditary bleeding problems, low energy levels due to low hemoglobin or being anemic Endocrine: High blood sugar requiring insulin (IDDM) Rheumatologic: No  reported rheumatological signs and symptoms such as fatigue, joint pain, tenderness, swelling, redness, heat, stiffness, decreased range of motion, with or without associated rash Musculoskeletal: Negative for myasthenia gravis, muscular dystrophy, multiple sclerosis or malignant hyperthermia Work History: Disabled  Allergies  Ms. Hileman is allergic to simvastatin.  Laboratory Chemistry Profile   Renal Lab Results  Component Value Date   BUN 6 04/08/2018   CREATININE 0.77 04/08/2018   GFRAA >60 04/08/2018   GFRNONAA >60 04/08/2018   PROTEINUR NEGATIVE 04/08/2018     Electrolytes Lab Results  Component Value Date   NA 141 04/08/2018   K 3.3 (L) 04/08/2018   CL 106 04/08/2018   CALCIUM 8.7 (L) 04/08/2018   MG 2.2 10/12/2012     Hepatic Lab Results  Component Value Date   AST 17 04/08/2018   ALT 20 04/08/2018   ALBUMIN 3.2 (L) 04/08/2018   ALKPHOS 54 04/08/2018   LIPASE 18 02/24/2016     ID Lab Results  Component Value Date   STAPHAUREUS NEGATIVE 06/20/2014   MRSAPCR NEGATIVE 06/20/2014     Bone No results found for: VD25OH, VD125OH2TOT, HK7425ZD6, LO7564PP2, 25OHVITD1, 25OHVITD2, 25OHVITD3, TESTOFREE, TESTOSTERONE   Endocrine Lab Results  Component Value Date   GLUCOSE 157 (H) 04/08/2018   GLUCOSEU NEGATIVE 04/08/2018     Neuropathy No results found for: VITAMINB12, FOLATE, HGBA1C, HIV   CNS No results found for: COLORCSF, APPEARCSF, RBCCOUNTCSF, WBCCSF, POLYSCSF, LYMPHSCSF, EOSCSF, PROTEINCSF, GLUCCSF, JCVIRUS, CSFOLI, IGGCSF, LABACHR, ACETBL, LABACHR, ACETBL   Inflammation (CRP: Acute  ESR: Chronic) Lab Results  Component Value Date   ESRSEDRATE 11 08/27/2013   LATICACIDVEN 1.6 04/08/2018     Rheumatology No results found for: RF, ANA, LABURIC, URICUR, LYMEIGGIGMAB, LYMEABIGMQN, HLAB27   Coagulation Lab Results  Component Value Date   INR 0.9 03/27/2012   LABPROT 13.0 03/27/2012   APTT 32.2 03/27/2012   PLT 252 04/08/2018      Cardiovascular Lab Results  Component Value Date   HGB 12.5 04/08/2018   HCT 39.8 04/08/2018     Screening Lab Results  Component Value Date   STAPHAUREUS NEGATIVE 06/20/2014   MRSAPCR NEGATIVE 06/20/2014     Cancer No results found for: CEA, CA125, LABCA2   Allergens No results found for: ALMOND, APPLE, ASPARAGUS, AVOCADO, BANANA, BARLEY, BASIL, BAYLEAF, GREENBEAN, LIMABEAN, WHITEBEAN, BEEFIGE, REDBEET, BLUEBERRY, BROCCOLI, CABBAGE, MELON, CARROT, CASEIN, CASHEWNUT, CAULIFLOWER, CELERY     Note: Lab results reviewed.  PFSH  Drug: Ms. Cassells  reports no history of drug use. Alcohol:  reports no history of alcohol use. Tobacco:  reports that she has never smoked. She has never used smokeless tobacco. Medical:  has a past medical history of Anemia, Chronic back pain, Cyst in hand, Diabetes mellitus without complication ([place]), DVT (deep vein thrombosis) in pregnancy, Gout, Headache, Kidney stones, Sciatic pain, Sleep apnea, and Wears glasses. Family: family history includes Breast cancer in her maternal aunt; COPD in her mother; Diabetes in  her brother, brother, and brother; Heart attack in her father; Hypertension in her brother and brother; Thyroid disease in her mother.  Past Surgical History:  Procedure Laterality Date  . ABDOMINAL HYSTERECTOMY    . BACK SURGERY  2014   microdiskectomy - lumbar  . CARDIAC CATHETERIZATION     [facility]/19/11 [facility]): normal cononary anatomy, EF 64%  . CESAREAN SECTION    . KNEE SURGERY Right    arthroscopy  . LUMBAR LAMINECTOMY/DECOMPRESSION MICRODISCECTOMY Right 12/10/2013   Procedure: Right Lumbar Five to Sacral One Redo Microdiskectomy;  Surgeon: Erline Levine, MD;  Location: [place] [facility];  Service: Neurosurgery;  Laterality: Right;  Right L5-S1 Redo Microdiskectomy  . MAXIMUM ACCESS (MAS)POSTERIOR LUMBAR INTERBODY FUSION (PLIF) 1 LEVEL N/A 06/26/2014   Procedure: Lumbar five sacral one Maximum Access Posterior Lumbar Interbody Fusion;  Surgeon: Erline Levine, MD;  Location: [place] [facility]UR[facility] OR[facility];  Service: Neurosurgery;  Laterality: N/A;  . ROTATOR CUFF REPAIR Right    Active Ambulatory Problems    Diagnosis Date Noted  . Herniated lumbar intervertebral disc 12/10/2013  . Failed back surgical syndrome 05/20/2020  . Lumbar radicular pain 05/20/2020   Resolved Ambulatory Problems    Diagnosis Date Noted  . No Resolved Ambulatory Problems   Past Medical History:  Diagnosis Date  . Anemia   . Chronic back pain   . Cyst in hand   . Diabetes mellitus without complication ([place])   . DVT (deep vein thrombosis) in pregnancy   . Gout   . Headache   . Kidney stones   . Sciatic pain   . Sleep apnea   . Wears glasses    Constitutional Exam  General appearance: Well nourished, well developed, and well hydrated. In no apparent acute distress Vitals:   05/20/20 0810  BP: (!) 150/81  Pulse: 68  Resp: 16  Temp: 98 F (36.7 C)  SpO2: 100%  Weight: 198 lb (89.8 kg)  Height: 5' 3"  (1.6 m)   BMI Assessment: Estimated body mass index is 35.07 kg/m as calculated from the following:   Height as of this encounter: 5' 3"  (1.6 m).   Weight as of this encounter: 198 lb (89.8 kg).  BMI interpretation table: BMI level Category Range association with higher incidence of chronic pain  <18 kg/m2 Underweight   18.5-24.9 kg/m2 Ideal body weight   25-29.9 kg/m2 Overweight Increased incidence by 20%  30-34.9 kg/m2 Obese (Class I) Increased incidence by 68%  35-39.9 kg/m2 Severe obesity (Class II) Increased incidence by 136%  >40 kg/m2 Extreme obesity (Class III) Increased incidence by 254%   Patient's current BMI Ideal Body weight  Body mass index is 35.07 kg/m. Ideal body weight: 52.4 kg (115 lb 8.3 oz) Adjusted ideal body weight: 67.4 kg (148 lb 8.2 oz)   BMI Readings from Last 4 Encounters:  05/20/20 35.07 kg/m  04/08/18 39.15 kg/m  02/24/16 44.40 kg/m  06/20/14 44.42 kg/m   Wt Readings from Last 4 Encounters:  05/20/20 198 lb (89.8 kg)   04/08/18 221 lb (100.2 kg)  02/24/16 235 lb (106.6 kg)  06/20/14 235 lb 1.6 oz (106.6 kg)    Psych/Mental status: Alert, oriented x 3 (person, place, & time)       Eyes: PERLA Respiratory: No evidence of acute respiratory distress   Lumbar Exam  Skin & Axial Inspection: Well healed scar from previous spine surgery detected Alignment: Symmetrical Functional ROM: Pain restricted ROM affecting both sides Stability: No instability detected Muscle Tone/Strength: Functionally intact. No obvious  neuro-muscular anomalies detected. Sensory (Neurological): Dermatomal pain pattern right L5-S1   Gait & Posture Assessment  Ambulation: Unassisted Gait: Relatively normal for age and body habitus Posture: WNL   Lower Extremity Exam    Side: Right lower extremity  Side: Left lower extremity  Stability: No instability observed          Stability: No instability observed          Skin & Extremity Inspection: Skin color, temperature, and hair growth are WNL. No peripheral edema or cyanosis. No masses, redness, swelling, asymmetry, or associated skin lesions. No contractures.  Skin & Extremity Inspection: Skin color, temperature, and hair growth are WNL. No peripheral edema or cyanosis. No masses, redness, swelling, asymmetry, or associated skin lesions. No contractures.  Functional ROM: Pain restricted ROM for all joints of the lower extremity Limited SLR (straight leg raise)  Functional ROM: Unrestricted ROM                  Muscle Tone/Strength: Functionally intact. No obvious neuro-muscular anomalies detected.  Muscle Tone/Strength: Functionally intact. No obvious neuro-muscular anomalies detected.  Sensory (Neurological): Dermatomal pain pattern        Sensory (Neurological): Unimpaired        DTR: Patellar: deferred today Achilles: deferred today Plantar: deferred today  DTR: Patellar: deferred today Achilles: deferred today Plantar: deferred today  Palpation: No palpable anomalies   Palpation: No palpable anomalies   Assessment  Primary Diagnosis & Pertinent Problem List: The primary encounter diagnosis was Failed back surgical syndrome. Diagnoses of Lumbar radicular pain (right), Chronic radicular lumbar pain, History of lumbar fusion, and Chronic pain syndrome were also pertinent to this visit.  Visit Diagnosis (New problems to examiner): 1. Failed back surgical syndrome   2. Lumbar radicular pain (right)   3. Chronic radicular lumbar pain   4. History of lumbar fusion   5. Chronic pain syndrome    Today we discussed options, primarily nonopioid-based, to help manage her chronic pain of her right leg related to failed back surgical syndrome with associated chronic right L5-S1 radiculopathy that has been refractory to epidural steroid injection, physical therapy, as well as medication management.  Recommend patient transition from gabapentin to Lyrica as below.  Continue Zoloft for depression and anxiety.  We had a long discussion about neuromodulation and dorsal column stimulation for failed back surgical syndrome and chronic lumbar radicular pain.  I did chance to evaluate the patient's interlaminar windows under live fluoroscopy she does not have any significant interlaminar arthrosis that would preclude safe percutaneous trial.  Recommend patient follow-up with me in 4 weeks to assess response to Lyrica and to further discuss if she would like to move forward with spinal cord stimulator trial.  If so we can help arrange a psych evaluation as well as a thoracic MRI to rule out any thoracic canal stenosis in anticipation of spinal cord stimulator trial/implant.  Plan of Care (Initial workup plan)   Lab Orders     Compliance Drug Analysis, Ur  Imaging Orders     DG PAIN CLINIC C-ARM 1-60 MIN NO REPORT  Medications ordered:  Meds ordered this encounter  Medications  . pregabalin (LYRICA) 50 MG capsule    Sig: 50 mg in morning, 50 mg afternoon, 100 mg qhs     Dispense:  120 capsule    Refill:  1    Fill one day early if pharmacy is closed on scheduled refill date. Hartje substitute for generic if available.  Medications administered during this visit: Deajah G. Latorre had no medications administered during this visit.   Pharmacological management options:  Opioid Analgesics: The patient was informed that there is no guarantee that she would be a candidate for opioid analgesics. The decision will be made following CDC guidelines. This decision will be based on the results of diagnostic studies, as well as Ms. Holloran's risk profile.   Membrane stabilizer: Transition from gabapentin to Lyrica.  Muscle relaxant: To be determined at a later time  NSAID: Avoid given history of diabetes  Other analgesic(s): To be determined at a later time   Interventional management options: Ms. Degroat was informed that there is no guarantee that she would be a candidate for interventional therapies. The decision will be based on the results of diagnostic studies, as well as Ms. Finigan's risk profile.  Procedure(s) under consideration:  Spinal cord stimulator trial   Provider-requested follow-up: Return in about 4 weeks (around 06/17/2020) for Medication Management, in person.  Future Appointments  Date Time Provider [place]  06/16/2020  2:20 PM Gillis Santa, MD [facility] None    Note by: Gillis Santa, MD Date: 05/20/2020; Time: 10:20 AM

## 2020-05-20 NOTE — Progress Notes (Signed)
Safety precautions to be maintained throughout the outpatient stay will include: orient to surroundings, keep bed in low position, maintain call bell within reach at all times, provide assistance with transfer out of bed and ambulation.  

## 2020-05-26 LAB — COMPLIANCE DRUG ANALYSIS, UR

## 2020-06-09 ENCOUNTER — Other Ambulatory Visit: Payer: Self-pay | Admitting: Neurosurgery

## 2020-06-16 ENCOUNTER — Encounter: Payer: Medicare HMO | Admitting: Student in an Organized Health Care Education/Training Program

## 2020-07-06 ENCOUNTER — Other Ambulatory Visit: Payer: Self-pay | Admitting: Family Medicine

## 2020-07-06 DIAGNOSIS — Z1231 Encounter for screening mammogram for malignant neoplasm of breast: Secondary | ICD-10-CM

## 2020-07-21 NOTE — H&P (Signed)
Patient ID:   [id number] Patient: Pamela Torres  Date of Birth: 29-May-1965 Visit Type: Office Visit   Date: 06/08/2020 02:15 PM Provider: Marchia Meiers. Vertell Limber MD   This 55 year old female presents for Injection review.  HISTORY OF PRESENT ILLNESS: 1.  Injection review  The patient returns to discuss her results from her injection.  She underwent a right L4-5 interlaminar injection on 05/11/2020.  The patient is currently describing her pain at 6/10.  She says that the injection did not help her very much.  She continues to have EHL weakness on the right at 4/5.  Her pain physician had suggested a spinal cord stimulator to the patient but I told her that I thought this might not give her significant improvement given that she has an active radiculopathy and have  instead recommended decompression of her nerve with exploration of L5-S1 fusion with right L4-5 TLIF with pedicle screw fixation and posterolateral arthrodesis.  The patient wishes to good with surgery.  She says that she is miserable and cannot function.  She would like to do this at the end of [place].      Medical/Surgical/Interim History Reviewed, no change.  Last detailed document date:03/11/2013.     PAST MEDICAL HISTORY, SURGICAL HISTORY, FAMILY HISTORY, SOCIAL HISTORY AND REVIEW OF SYSTEMS I have reviewed the patient's past medical, surgical, family and social history as well as the comprehensive review of systems as included on the [facility] history form dated 04/10/2020, which I have signed.  Family History: Reviewed, no changes.  Last detailed document date:03/11/2013.   Social History: Reviewed, no changes. Last detailed document date: 03/11/2013.    MEDICATIONS: (added, continued or stopped this visit) Started Medication Directions Instruction Stopped  glipizide 5 mg tablet take 1 tablet by oral route  every day before meals    metformin 500 mg tablet take 1 tablet by  oral route 2 times every day with morning and evening meals   04/15/2019 methocarbamol 500 mg tablet take 1 tablet by oral route 3 times every day   09/24/2019 Neurontin 300 mg capsule take 1 capsule by oral route morning and mid day; 2 capsules at bedtime    Ozempic 0.25 mg or 0.5 mg (2 mg/1.5 mL) subcutaneous pen injector    09/20/2019 tizanidine 2 mg tablet take 1-2 tabs up to q8hrs prn spasm   04/28/2017 tramadol 50 mg tablet take 1 tablet by oral route  every 6 hours as needed for pain      ALLERGIES: Ingredient Reaction Medication Name Comment NO KNOWN ALLERGIES    No known allergies. Reviewed, no changes.    PHYSICAL EXAM:  Vitals Date Temp F BP Pulse Ht In Wt Lb BMI BSA Pain Score 06/08/2020  142/78 75 62 200.6 36.69  6/10     IMPRESSION:  Persistent right leg weakness and failure to improve despite injection.  PLAN: Proceed with decompression and fusion at L4-5 level with exploration of L5-S1 fusion.  Once again we discussed the need for weight control and the patient is trying to work on this.  She is given a prescription for LSO brace.  Risks and benefits were discussed in detail with patient and she wishes to proceed  Orders: Diagnostic Procedures: Assessment Procedure M54.16 Lumbar Spine- AP/Lat Instruction(s)/Education: Assessment Instruction R03.0 Lifestyle education Z68.36 Dietary management education, guidance, and counseling Miscellaneous: Assessment  M48.061 LSO Brace  Assessment/Plan  # Detail Type Description  1. 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  2. Assessment Low back pain, unspecified back pain laterality, unspecified chronicity, unspecified whether sciatica present (M54.50).     3. Assessment Lumbar spondylosis (M47.816).     4. Assessment Lumbar radiculopathy (M54.16).     5. Assessment Lumbar stenosis without neurogenic claudication  (M48.061).  Plan Orders LSO Brace.     6. Assessment Elevated blood-pressure reading, w/o diagnosis of htn (R03.0).     7. Assessment Body mass index (BMI) 36.0-36.9, adult (Z68.36).  Plan Orders Today's instructions / counseling include(s) Dietary management education, guidance, and counseling. Clinical information/comments: Encouraged patient to eat well balanced diet.       Pain Management Plan Pain Scale: 6/10. Method: Numeric Pain Intensity Scale. Location: back. Onset: 09/16/2013. Duration: varies. Quality: discomforting. Pain management follow-up plan of care: Patient will continue medication management..              Provider:  Marchia Meiers. Vertell Limber MD  06/11/2020 02:58 PM    Dictation edited by: Marchia Meiers. [facility]    CC Providers: Burman Freestone [address] [place],  [place]  [id number]   Milinda Pointer  [address] [place], [place] [address]-               Electronically signed by Marchia Meiers Vertell Limber MD on 06/11/2020 02:58 PM

## 2020-07-21 NOTE — Pre-Procedure Instructions (Signed)
Surgical Instructions    Your procedure is scheduled on Tuesday, [place]raz[place] 31st.  Report to [facility] Main Entrance "A" at 5:30 A.M., then check in with the Admitting office.  Call this number if you have problems the morning of surgery:  [phone]   If you have any questions prior to your surgery date call [phone]: Open Monday-Friday 8am-4pm    Remember:  Do not eat or drink after midnight the night before your surgery   Take these medicines the morning of surgery with A SIP OF WATER  gabapentin (NEURONTIN)  pregabalin (LYRICA) sertraline (ZOLOFT)   As of today, STOP taking any Aspirin (unless otherwise instructed by your surgeon) Aleve, Naproxen, Ibuprofen, Motrin, Advil, Goody's, BC's, all herbal medications, fish oil, and all vitamins.   WHAT DO I DO ABOUT MY DIABETES MEDICATION?   Marland Kitchen Do not take metFORMIN (GLUCOPHAGE) the morning of surgery.  . The day of surgery, do not take other diabetes injectables, including Semaglutide (OZEMPIC).    HOW TO MANAGE YOUR DIABETES BEFORE AND AFTER SURGERY  Why is it important to control my blood sugar before and after surgery? . Improving blood sugar levels before and after surgery helps healing and can limit problems. . A way of improving blood sugar control is eating a healthy diet by: o  Eating less sugar and carbohydrates o  Increasing activity/exercise o  Talking with your doctor about reaching your blood sugar goals . High blood sugars (greater than 180 mg/dL) can raise your risk of infections and slow your recovery, so you will need to focus on controlling your diabetes during the weeks before surgery. . Make sure that the doctor who takes care of your diabetes knows about your planned surgery including the date and location.  How do I manage my blood sugar before surgery? . Check your blood sugar at least 4 times a day, starting 2 days before surgery, to make sure that the level is not too high or low.  . Check your blood  sugar the morning of your surgery when you wake up and every 2 hours until you get to the Short Stay unit.  o If your blood sugar is less than 70 mg/dL, you will need to treat for low blood sugar: - Do not take insulin. - Treat a low blood sugar (less than 70 mg/dL) with  cup of clear juice (cranberry or apple), 4 glucose tablets, OR glucose gel. - Recheck blood sugar in 15 minutes after treatment (to make sure it is greater than 70 mg/dL). If your blood sugar is not greater than 70 mg/dL on recheck, call [phone] for further instructions. . Report your blood sugar to the short stay nurse when you get to Short Stay.  . If you are admitted to the hospital after surgery: o Your blood sugar will be checked by the staff and you will probably be given insulin after surgery (instead of oral diabetes medicines) to make sure you have good blood sugar levels. o The goal for blood sugar control after surgery is 80-180 mg/dL.                      Do NOT Smoke (Tobacco/Vaping) or drink Alcohol 24 hours prior to your procedure.  If you use a CPAP at night, you [place] bring all equipment for your overnight stay.   Contacts, glasses, piercing's, hearing aid's, dentures or partials [place] not be worn into surgery, please bring cases for these belongings.  For patients admitted to the hospital, discharge time will be determined by your treatment team.   Patients discharged the day of surgery will not be allowed to drive home, and someone needs to stay with them for 24 hours.    Special instructions:   [place]- Preparing For Surgery  Before surgery, you can play an important role. Because skin is not sterile, your skin needs to be as free of germs as possible. You can reduce the number of germs on your skin by washing with CHG (chlorahexidine gluconate) Soap before surgery.  CHG is an antiseptic cleaner which kills germs and bonds with the skin to continue killing germs even after washing.    Oral  Hygiene is also important to reduce your risk of infection.  Remember - BRUSH YOUR TEETH THE MORNING OF SURGERY WITH YOUR REGULAR TOOTHPASTE  Please do not use if you have an allergy to CHG or antibacterial soaps. If your skin becomes reddened/irritated stop using the CHG.  Do not shave (including legs and underarms) for at least 48 hours prior to first CHG shower. It is OK to shave your face.  Please follow these instructions carefully.   1. Shower the NIGHT BEFORE SURGERY and the MORNING OF SURGERY  2. If you chose to wash your hair, wash your hair first as usual with your normal shampoo.  3. After you shampoo, rinse your hair and body thoroughly to remove the shampoo.  4. Use CHG Soap as you would any other liquid soap. You can apply CHG directly to the skin and wash gently with a scrungie or a clean washcloth.   5. Apply the CHG Soap to your body ONLY FROM THE NECK DOWN.  Do not use on open wounds or open sores. Avoid contact with your eyes, ears, mouth and genitals (private parts). Wash Face and genitals (private parts)  with your normal soap.   6. Wash thoroughly, paying special attention to the area where your surgery will be performed.  7. Thoroughly rinse your body with warm water from the neck down.  8. DO NOT shower/wash with your normal soap after using and rinsing off the CHG Soap.  9. Pat yourself dry with a CLEAN TOWEL.  10. Wear CLEAN PAJAMAS to bed the night before surgery  11. Place CLEAN SHEETS on your bed the night before your surgery  12. DO NOT SLEEP WITH PETS.   Day of Surgery: Shower with CHG soap. Do not wear jewelry, make up, or nail polish Do not wear lotions, powders, perfumes, or deodorant. Do not shave 48 hours prior to surgery.   Do not bring valuables to the hospital. [facility] is not responsible for any belongings or valuables. Wear Clean/Comfortable clothing the morning of surgery Remember to brush your teeth WITH YOUR REGULAR TOOTHPASTE.    Please read over the following fact sheets that you were given.

## 2020-07-22 ENCOUNTER — Encounter (HOSPITAL_COMMUNITY)
Admission: RE | Admit: 2020-07-22 | Discharge: 2020-07-22 | Disposition: A | Discharge status: Home / Self Care | Payer: Medicare HMO | Source: Ambulatory Visit | Attending: Neurosurgery | Admitting: Neurosurgery

## 2020-07-22 ENCOUNTER — Encounter (HOSPITAL_COMMUNITY): Payer: Self-pay

## 2020-07-22 ENCOUNTER — Other Ambulatory Visit: Payer: Self-pay

## 2020-07-22 DIAGNOSIS — E119 Type 2 diabetes mellitus without complications: Secondary | ICD-10-CM | POA: Diagnosis not present

## 2020-07-22 DIAGNOSIS — Z01818 Encounter for other preprocedural examination: Secondary | ICD-10-CM | POA: Diagnosis present

## 2020-07-22 HISTORY — DX: Personal history of urinary calculi: Z87.442

## 2020-07-22 HISTORY — DX: Anxiety disorder, unspecified: F41.9

## 2020-07-22 LAB — BASIC METABOLIC PANEL
Anion gap: 7 (ref 5–15)
BUN: 9 mg/dL (ref 6–20)
CO2: 27 mmol/L (ref 22–32)
Calcium: 9 mg/dL (ref 8.9–10.3)
Chloride: 104 mmol/L (ref 98–111)
Creatinine, Ser: 0.74 mg/dL (ref 0.44–1.00)
GFR, Estimated: >60 mL/min (ref >60)
Glucose, Bld: 146 mg/dL — ABNORMAL HIGH (ref 70–99)
Potassium: 3.9 mmol/L (ref 3.5–5.1)
Sodium: 138 mmol/L (ref 135–145)

## 2020-07-22 LAB — CBC
HCT: 41.2 % (ref 36.0–46.0)
Hemoglobin: 13.2 g/dL (ref 12.0–15.0)
MCH: 28.9 pg (ref 26.0–34.0)
MCHC: 32 g/dL (ref 30.0–36.0)
MCV: 90.4 fL (ref 80.0–100.0)
Platelets: 284 10*3/uL (ref 150–400)
RBC: 4.56 MIL/uL (ref 3.87–5.11)
RDW: 13.3 % (ref 11.5–15.5)
WBC: 9.1 10*3/uL (ref 4.0–10.5)
nRBC: 0 % (ref 0.0–0.2)

## 2020-07-22 LAB — SURGICAL PCR SCREEN
MRSA, PCR: NEGATIVE
Staphylococcus aureus: NEGATIVE

## 2020-07-22 LAB — TYPE AND SCREEN
ABO/RH(D): B POS
Antibody Screen: NEGATIVE

## 2020-07-22 LAB — GLUCOSE, CAPILLARY: Glucose-Capillary: 137 mg/dL — ABNORMAL HIGH (ref 70–99)

## 2020-07-22 NOTE — Progress Notes (Signed)
PCP - Burman Freestone Cardiologist - [facility]  Chest x-ray - denies EKG - 07/22/20 Stress Test - > 7-10 years ago clean results ECHO - denies Cardiac Cath - deneis  Dx with Sleep apnea 10-12 years ago Wore CPAP but lost weight and no longer needs CPAP   Fasting Blood Sugar - 105ish Checks Blood Sugar __3___ times a week  Blood Thinner Instructions:denies Aspirin Instructions:denies  COVID TEST- COVID DOS, Lindsi aware   Anesthesia review: yes  Patient denies shortness of breath, fever, cough and chest pain at PAT appointment   All instructions explained to the patient, with a verbal understanding of the material. Patient agrees to go over the instructions while at home for a better understanding. Patient also instructed to self quarantine after being tested for COVID-19. The opportunity to ask questions was provided.

## 2020-07-23 LAB — HEMOGLOBIN A1C
Hgb A1c MFr Bld: 5.8 % — ABNORMAL HIGH (ref 4.8–5.6)
Mean Plasma Glucose: 120 mg/dL

## 2020-07-24 NOTE — Progress Notes (Signed)
Anesthesia Chart Review:  Seen by cardiologist Dr. Saralyn Pilar at [facility] 10/03/2016 for palpitations. Per his note, she has a history of cardiac cath 01/19/2011 that showed normal coronary anatomy, normal left ventricular function. At that time, 48hr Holter monitor was ordered. Per followup telephone encounter, results of this were benign. Advised to followup if symptoms persisted.   Hx of OSA, pt reports no longer needs CPAP after weight loss.  Preop labs reviewed, unremarkable. DMII well controlled, A1c 5.8.  EKG 07/22/20: Normal sinus rhythm. Rate 70. Septal infarct , age undetermined. No significant change since last tracing   Pamela Torres [facility] Short Stay Center/Anesthesiology Phone [phone] 07/24/2020 9:14 AM

## 2020-07-24 NOTE — Anesthesia Preprocedure Evaluation (Addendum)
Anesthesia Evaluation  Patient identified by MRN, date of birth, ID band Patient awake    Reviewed: Allergy & Precautions, NPO status , Patient's Chart, lab work & pertinent test results  Airway Mallampati: III  TM Distance: <3 FB Neck ROM: Full    Dental no notable dental hx.    Pulmonary sleep apnea and Continuous Positive Airway Pressure Ventilation ,    Pulmonary exam normal breath sounds clear to auscultation       Cardiovascular negative cardio ROS Normal cardiovascular exam Rhythm:Regular Rate:Normal     Neuro/Psych negative neurological ROS  negative psych ROS   GI/Hepatic negative GI ROS, Neg liver ROS,   Endo/Other  diabetes, Type 2  Renal/GU negative Renal ROS  negative genitourinary   Musculoskeletal negative musculoskeletal ROS (+)   Abdominal   Peds negative pediatric ROS (+)  Hematology negative hematology ROS (+)   Anesthesia Other Findings   Reproductive/Obstetrics negative OB ROS                            Anesthesia Physical Anesthesia Plan  ASA: III  Anesthesia Plan: General   Post-op Pain Management:    Induction: Intravenous  PONV Risk Score and Plan: 3 and Ondansetron, Dexamethasone, Treatment Demartini vary due to age or medical condition and Midazolam  Airway Management Planned: Oral ETT  Additional Equipment:   Intra-op Plan:   Post-operative Plan: Extubation in OR  Informed Consent: I have reviewed the patients History and Physical, chart, labs and discussed the procedure including the risks, benefits and alternatives for the proposed anesthesia with the patient or authorized representative who has indicated his/her understanding and acceptance.     Dental advisory given  Plan Discussed with: CRNA and Surgeon  Anesthesia Plan Comments: (PAT note by Karoline Caldwell, PA-C: Seen by cardiologist Dr. Saralyn Pilar at [facility] 10/03/2016 for palpitations.  Per his note, she has a history of cardiac cath 01/19/2011 that showed normal coronary anatomy, normal left ventricular function. At that time, 48hr Holter monitor was ordered. Per followup telephone encounter, results of this were benign. Advised to followup if symptoms persisted.   Hx of OSA, pt reports no longer needs CPAP after weight loss.  Preop labs reviewed, unremarkable. DMII well controlled, A1c 5.8.  EKG 07/22/20: Normal sinus rhythm. Rate 70. Septal infarct , age undetermined. No significant change since last tracing )       Anesthesia Quick Evaluation

## 2020-07-28 ENCOUNTER — Encounter (HOSPITAL_COMMUNITY): Payer: Self-pay | Admitting: Neurosurgery

## 2020-07-28 ENCOUNTER — Inpatient Hospital Stay (HOSPITAL_COMMUNITY): Payer: Medicare HMO | Admitting: Physician Assistant

## 2020-07-28 ENCOUNTER — Encounter (HOSPITAL_COMMUNITY)
Admission: RE | Disposition: A | Discharge status: Home / Self Care | Payer: Self-pay | Source: Home / Self Care | Attending: Neurosurgery

## 2020-07-28 ENCOUNTER — Inpatient Hospital Stay (HOSPITAL_COMMUNITY): Payer: Medicare HMO

## 2020-07-28 ENCOUNTER — Inpatient Hospital Stay (HOSPITAL_COMMUNITY)
Admission: RE | Admit: 2020-07-28 | Discharge: 2020-07-29 | DRG: 455 | Disposition: A | Discharge status: Home / Self Care | Payer: Medicare HMO | Attending: Neurosurgery | Admitting: Neurosurgery

## 2020-07-28 ENCOUNTER — Other Ambulatory Visit: Payer: Self-pay

## 2020-07-28 DIAGNOSIS — Z888 Allergy status to other drugs, medicaments and biological substances status: Secondary | ICD-10-CM | POA: Diagnosis not present

## 2020-07-28 DIAGNOSIS — M5116 Intervertebral disc disorders with radiculopathy, lumbar region: Secondary | ICD-10-CM | POA: Diagnosis present

## 2020-07-28 DIAGNOSIS — M4316 Spondylolisthesis, lumbar region: Secondary | ICD-10-CM | POA: Diagnosis present

## 2020-07-28 DIAGNOSIS — M4726 Other spondylosis with radiculopathy, lumbar region: Secondary | ICD-10-CM | POA: Diagnosis present

## 2020-07-28 DIAGNOSIS — Z7984 Long term (current) use of oral hypoglycemic drugs: Secondary | ICD-10-CM

## 2020-07-28 DIAGNOSIS — E119 Type 2 diabetes mellitus without complications: Secondary | ICD-10-CM | POA: Diagnosis present

## 2020-07-28 DIAGNOSIS — G4730 Sleep apnea, unspecified: Secondary | ICD-10-CM | POA: Diagnosis present

## 2020-07-28 DIAGNOSIS — Z79899 Other long term (current) drug therapy: Secondary | ICD-10-CM | POA: Diagnosis not present

## 2020-07-28 DIAGNOSIS — Z20822 Contact with and (suspected) exposure to covid-19: Secondary | ICD-10-CM | POA: Diagnosis present

## 2020-07-28 DIAGNOSIS — R030 Elevated blood-pressure reading, without diagnosis of hypertension: Secondary | ICD-10-CM | POA: Diagnosis present

## 2020-07-28 DIAGNOSIS — M48061 Spinal stenosis, lumbar region without neurogenic claudication: Secondary | ICD-10-CM | POA: Diagnosis present

## 2020-07-28 DIAGNOSIS — Z419 Encounter for procedure for purposes other than remedying health state, unspecified: Secondary | ICD-10-CM

## 2020-07-28 HISTORY — PX: TRANSFORAMINAL LUMBAR INTERBODY FUSION (TLIF) WITH PEDICLE SCREW FIXATION 1 LEVEL: SHX6141

## 2020-07-28 LAB — GLUCOSE, CAPILLARY
Glucose-Capillary: 108 mg/dL — ABNORMAL HIGH (ref 70–99)
Glucose-Capillary: 136 mg/dL — ABNORMAL HIGH (ref 70–99)
Glucose-Capillary: 171 mg/dL — ABNORMAL HIGH (ref 70–99)
Glucose-Capillary: 192 mg/dL — ABNORMAL HIGH (ref 70–99)

## 2020-07-28 LAB — SARS CORONAVIRUS 2 BY RT PCR (HOSPITAL ORDER, PERFORMED IN ~~LOC~~ HOSPITAL LAB): SARS Coronavirus 2: NEGATIVE

## 2020-07-28 SURGERY — TRANSFORAMINAL LUMBAR INTERBODY FUSION (TLIF) WITH PEDICLE SCREW FIXATION 1 LEVEL
Anesthesia: General | Site: Back

## 2020-07-28 MED ORDER — MENTHOL 3 MG MT LOZG: 1.0000 | LOZENGE | OROMUCOSAL | Status: DC | PRN

## 2020-07-28 MED ORDER — SEMAGLUTIDE (1 MG/DOSE) 4 MG/3ML ~~LOC~~ SOPN: 1.0000 mg | PEN_INJECTOR | SUBCUTANEOUS | Status: DC

## 2020-07-28 MED ORDER — BISACODYL 10 MG RE SUPP: 10.0000 mg | Freq: Every day | RECTAL | Status: DC | PRN

## 2020-07-28 MED ORDER — LACTATED RINGERS IV SOLN: INTRAVENOUS | Status: DC

## 2020-07-28 MED ORDER — PHENYLEPHRINE 40 MCG/ML (10ML) SYRINGE FOR IV PUSH (FOR BLOOD PRESSURE SUPPORT)
PREFILLED_SYRINGE | INTRAVENOUS | Status: AC
  Filled 2020-07-28: qty 30

## 2020-07-28 MED ORDER — PHENOL 1.4 % MT LIQD: 1.0000 | OROMUCOSAL | Status: DC | PRN

## 2020-07-28 MED ORDER — KETAMINE HCL 50 MG/5ML IJ SOSY
PREFILLED_SYRINGE | INTRAMUSCULAR | Status: AC
  Filled 2020-07-28: qty 5

## 2020-07-28 MED ORDER — THROMBIN 5000 UNITS EX SOLR
OROMUCOSAL | Status: DC | PRN
  Administered 2020-07-28: 5 mL via TOPICAL

## 2020-07-28 MED ORDER — GABAPENTIN 300 MG PO CAPS
600.0000 mg | ORAL_CAPSULE | Freq: Every day | ORAL | Status: DC
  Administered 2020-07-28: 600 mg via ORAL
  Filled 2020-07-28: qty 2

## 2020-07-28 MED ORDER — KCL IN DEXTROSE-NACL 20-5-0.45 MEQ/L-%-% IV SOLN: INTRAVENOUS | Status: DC

## 2020-07-28 MED ORDER — HYDROMORPHONE HCL 1 MG/ML IJ SOLN
0.2500 mg | INTRAMUSCULAR | Status: DC | PRN
  Administered 2020-07-28 (×3): 0.25 mg via INTRAVENOUS

## 2020-07-28 MED ORDER — METHOCARBAMOL 500 MG PO TABS
500.0000 mg | ORAL_TABLET | Freq: Four times a day (QID) | ORAL | Status: DC | PRN
  Administered 2020-07-28 – 2020-07-29 (×3): 500 mg via ORAL
  Filled 2020-07-28 (×3): qty 1

## 2020-07-28 MED ORDER — ACETAMINOPHEN 10 MG/ML IV SOLN
INTRAVENOUS | Status: DC | PRN
  Administered 2020-07-28: 1000 mg via INTRAVENOUS

## 2020-07-28 MED ORDER — BUPIVACAINE HCL (PF) 0.5 % IJ SOLN
INTRAMUSCULAR | Status: DC | PRN
  Administered 2020-07-28: 5 mL

## 2020-07-28 MED ORDER — BUPIVACAINE LIPOSOME 1.3 % IJ SUSP
INTRAMUSCULAR | Status: AC
  Filled 2020-07-28: qty 20

## 2020-07-28 MED ORDER — CHLORHEXIDINE GLUCONATE 0.12 % MT SOLN
15.0000 mL | Freq: Once | OROMUCOSAL | Status: AC
  Administered 2020-07-28: 15 mL via OROMUCOSAL
  Filled 2020-07-28: qty 15

## 2020-07-28 MED ORDER — ONDANSETRON HCL 4 MG/2ML IJ SOLN
INTRAMUSCULAR | Status: AC
  Filled 2020-07-28: qty 2

## 2020-07-28 MED ORDER — SODIUM CHLORIDE 0.9% FLUSH: 3.0000 mL | INTRAVENOUS | Status: DC | PRN

## 2020-07-28 MED ORDER — SUGAMMADEX SODIUM 200 MG/2ML IV SOLN
INTRAVENOUS | Status: DC | PRN
  Administered 2020-07-28: 200 mg via INTRAVENOUS

## 2020-07-28 MED ORDER — ACETAMINOPHEN 10 MG/ML IV SOLN
INTRAVENOUS | Status: AC
  Filled 2020-07-28: qty 100

## 2020-07-28 MED ORDER — INSULIN ASPART 100 UNIT/ML IJ SOLN
0.0000 [IU] | Freq: Three times a day (TID) | INTRAMUSCULAR | Status: DC
  Administered 2020-07-28: 3 [IU] via SUBCUTANEOUS

## 2020-07-28 MED ORDER — ONDANSETRON HCL 4 MG/2ML IJ SOLN: 4.0000 mg | Freq: Four times a day (QID) | INTRAMUSCULAR | Status: DC | PRN

## 2020-07-28 MED ORDER — OXYCODONE HCL 5 MG PO TABS: 5.0000 mg | ORAL_TABLET | Freq: Once | ORAL | Status: DC | PRN

## 2020-07-28 MED ORDER — FENTANYL CITRATE (PF) 250 MCG/5ML IJ SOLN
INTRAMUSCULAR | Status: DC | PRN
  Administered 2020-07-28: 50 ug via INTRAVENOUS
  Administered 2020-07-28: 100 ug via INTRAVENOUS
  Administered 2020-07-28 (×2): 50 ug via INTRAVENOUS

## 2020-07-28 MED ORDER — LIDOCAINE 2% (20 MG/ML) 5 ML SYRINGE
INTRAMUSCULAR | Status: DC | PRN
  Administered 2020-07-28: 80 mg via INTRAVENOUS

## 2020-07-28 MED ORDER — CEFAZOLIN SODIUM-DEXTROSE 2-4 GM/100ML-% IV SOLN
2.0000 g | Freq: Three times a day (TID) | INTRAVENOUS | Status: AC
  Administered 2020-07-28 (×2): 2 g via INTRAVENOUS
  Filled 2020-07-28 (×2): qty 100

## 2020-07-28 MED ORDER — KETAMINE HCL 10 MG/ML IJ SOLN
INTRAMUSCULAR | Status: DC | PRN
  Administered 2020-07-28: 25 mg via INTRAVENOUS
  Administered 2020-07-28: 15 mg via INTRAVENOUS
  Administered 2020-07-28: 10 mg via INTRAVENOUS

## 2020-07-28 MED ORDER — SODIUM CHLORIDE 0.9% FLUSH
3.0000 mL | Freq: Two times a day (BID) | INTRAVENOUS | Status: DC
  Administered 2020-07-28: 3 mL via INTRAVENOUS

## 2020-07-28 MED ORDER — CEFAZOLIN SODIUM-DEXTROSE 2-4 GM/100ML-% IV SOLN
2.0000 g | INTRAVENOUS | Status: AC
  Administered 2020-07-28: 2 g via INTRAVENOUS
  Filled 2020-07-28: qty 100

## 2020-07-28 MED ORDER — HYDROMORPHONE HCL 1 MG/ML IJ SOLN: 0.5000 mg | INTRAMUSCULAR | Status: DC | PRN

## 2020-07-28 MED ORDER — ALUM & MAG HYDROXIDE-SIMETH 200-200-20 MG/5ML PO SUSP: 30.0000 mL | Freq: Four times a day (QID) | ORAL | Status: DC | PRN

## 2020-07-28 MED ORDER — OXYCODONE HCL 5 MG/5ML PO SOLN: 5.0000 mg | Freq: Once | ORAL | Status: DC | PRN

## 2020-07-28 MED ORDER — METFORMIN HCL 500 MG PO TABS
500.0000 mg | ORAL_TABLET | Freq: Two times a day (BID) | ORAL | Status: DC
  Administered 2020-07-28 – 2020-07-29 (×2): 500 mg via ORAL
  Filled 2020-07-28 (×2): qty 1

## 2020-07-28 MED ORDER — INSULIN ASPART 100 UNIT/ML IJ SOLN: 0.0000 [IU] | Freq: Every day | INTRAMUSCULAR | Status: DC

## 2020-07-28 MED ORDER — LIDOCAINE-EPINEPHRINE 1 %-1:100000 IJ SOLN
INTRAMUSCULAR | Status: AC
  Filled 2020-07-28: qty 1

## 2020-07-28 MED ORDER — ALBUMIN HUMAN 5 % IV SOLN: INTRAVENOUS | Status: DC | PRN

## 2020-07-28 MED ORDER — PROPOFOL 10 MG/ML IV BOLUS
INTRAVENOUS | Status: DC | PRN
  Administered 2020-07-28: 120 mg via INTRAVENOUS

## 2020-07-28 MED ORDER — DOCUSATE SODIUM 100 MG PO CAPS
100.0000 mg | ORAL_CAPSULE | Freq: Two times a day (BID) | ORAL | Status: DC
  Administered 2020-07-28 – 2020-07-29 (×3): 100 mg via ORAL
  Filled 2020-07-28 (×3): qty 1

## 2020-07-28 MED ORDER — CHLORHEXIDINE GLUCONATE CLOTH 2 % EX PADS: 6.0000 | MEDICATED_PAD | Freq: Once | CUTANEOUS | Status: DC

## 2020-07-28 MED ORDER — PROPOFOL 10 MG/ML IV BOLUS
INTRAVENOUS | Status: AC
  Filled 2020-07-28: qty 20

## 2020-07-28 MED ORDER — ACETAMINOPHEN 650 MG RE SUPP: 650.0000 mg | RECTAL | Status: DC | PRN

## 2020-07-28 MED ORDER — MIDAZOLAM HCL 2 MG/2ML IJ SOLN
INTRAMUSCULAR | Status: AC
  Filled 2020-07-28: qty 2

## 2020-07-28 MED ORDER — ORAL CARE MOUTH RINSE: 15.0000 mL | Freq: Once | OROMUCOSAL | Status: AC

## 2020-07-28 MED ORDER — POLYETHYLENE GLYCOL 3350 17 G PO PACK: 17.0000 g | PACK | Freq: Every day | ORAL | Status: DC | PRN

## 2020-07-28 MED ORDER — DEXAMETHASONE SODIUM PHOSPHATE 10 MG/ML IJ SOLN
INTRAMUSCULAR | Status: AC
  Filled 2020-07-28: qty 1

## 2020-07-28 MED ORDER — DEXAMETHASONE SODIUM PHOSPHATE 10 MG/ML IJ SOLN
INTRAMUSCULAR | Status: DC | PRN
  Administered 2020-07-28: 10 mg via INTRAVENOUS

## 2020-07-28 MED ORDER — HYDROCODONE-ACETAMINOPHEN 5-325 MG PO TABS
2.0000 | ORAL_TABLET | ORAL | Status: DC | PRN
Start: 2020-07-28 — End: 2020-07-29
  Administered 2020-07-28 (×2): 2 via ORAL
  Filled 2020-07-28 (×2): qty 2

## 2020-07-28 MED ORDER — ONDANSETRON HCL 4 MG/2ML IJ SOLN
INTRAMUSCULAR | Status: DC | PRN
  Administered 2020-07-28: 4 mg via INTRAVENOUS

## 2020-07-28 MED ORDER — BUPIVACAINE LIPOSOME 1.3 % IJ SUSP
INTRAMUSCULAR | Status: DC | PRN
  Administered 2020-07-28: 20 mL

## 2020-07-28 MED ORDER — ONDANSETRON HCL 4 MG/2ML IJ SOLN
4.0000 mg | Freq: Once | INTRAMUSCULAR | Status: AC | PRN
  Administered 2020-07-28: 4 mg via INTRAVENOUS

## 2020-07-28 MED ORDER — PANTOPRAZOLE SODIUM 40 MG IV SOLR: 40.0000 mg | Freq: Every day | INTRAVENOUS | Status: DC

## 2020-07-28 MED ORDER — KETOROLAC TROMETHAMINE 15 MG/ML IJ SOLN
15.0000 mg | Freq: Four times a day (QID) | INTRAMUSCULAR | Status: AC
  Administered 2020-07-28 – 2020-07-29 (×4): 15 mg via INTRAVENOUS
  Filled 2020-07-28 (×4): qty 1

## 2020-07-28 MED ORDER — 0.9 % SODIUM CHLORIDE (POUR BTL) OPTIME
TOPICAL | Status: DC | PRN
  Administered 2020-07-28: 1000 mL

## 2020-07-28 MED ORDER — PANTOPRAZOLE SODIUM 40 MG PO TBEC
40.0000 mg | DELAYED_RELEASE_TABLET | Freq: Every day | ORAL | Status: DC
  Administered 2020-07-28: 40 mg via ORAL
  Filled 2020-07-28: qty 1

## 2020-07-28 MED ORDER — MIDAZOLAM HCL 5 MG/5ML IJ SOLN
INTRAMUSCULAR | Status: DC | PRN
  Administered 2020-07-28: 2 mg via INTRAVENOUS

## 2020-07-28 MED ORDER — FLEET ENEMA 7-19 GM/118ML RE ENEM: 1.0000 | ENEMA | Freq: Once | RECTAL | Status: DC | PRN

## 2020-07-28 MED ORDER — ONDANSETRON HCL 4 MG PO TABS: 4.0000 mg | ORAL_TABLET | Freq: Four times a day (QID) | ORAL | Status: DC | PRN

## 2020-07-28 MED ORDER — THROMBIN 5000 UNITS EX SOLR
CUTANEOUS | Status: AC
  Filled 2020-07-28: qty 5000

## 2020-07-28 MED ORDER — METHOCARBAMOL 1000 MG/10ML IJ SOLN
500.0000 mg | Freq: Four times a day (QID) | INTRAMUSCULAR | Status: DC | PRN
  Filled 2020-07-28: qty 5

## 2020-07-28 MED ORDER — ACETAMINOPHEN 325 MG PO TABS: 650.0000 mg | ORAL_TABLET | ORAL | Status: DC | PRN

## 2020-07-28 MED ORDER — FENTANYL CITRATE (PF) 250 MCG/5ML IJ SOLN
INTRAMUSCULAR | Status: AC
  Filled 2020-07-28: qty 5

## 2020-07-28 MED ORDER — PHENYLEPHRINE HCL-NACL 10-0.9 MG/250ML-% IV SOLN
INTRAVENOUS | Status: DC | PRN
  Administered 2020-07-28: 25 ug/min via INTRAVENOUS

## 2020-07-28 MED ORDER — VITAMIN D 25 MCG (1000 UNIT) PO TABS
1000.0000 [IU] | ORAL_TABLET | Freq: Every day | ORAL | Status: DC
  Administered 2020-07-28 – 2020-07-29 (×2): 1000 [IU] via ORAL
  Filled 2020-07-28 (×3): qty 1

## 2020-07-28 MED ORDER — GABAPENTIN 300 MG PO CAPS
300.0000 mg | ORAL_CAPSULE | ORAL | Status: DC
  Administered 2020-07-28 – 2020-07-29 (×2): 300 mg via ORAL
  Filled 2020-07-28 (×2): qty 1

## 2020-07-28 MED ORDER — OXYCODONE HCL 5 MG PO TABS
10.0000 mg | ORAL_TABLET | ORAL | Status: DC | PRN
  Administered 2020-07-28 – 2020-07-29 (×3): 10 mg via ORAL
  Filled 2020-07-28 (×3): qty 2

## 2020-07-28 MED ORDER — HYDROXYZINE HCL 50 MG/ML IM SOLN
50.0000 mg | Freq: Four times a day (QID) | INTRAMUSCULAR | Status: DC | PRN
  Administered 2020-07-28: 50 mg via INTRAMUSCULAR
  Filled 2020-07-28: qty 1

## 2020-07-28 MED ORDER — ROCURONIUM BROMIDE 10 MG/ML (PF) SYRINGE
PREFILLED_SYRINGE | INTRAVENOUS | Status: DC | PRN
  Administered 2020-07-28: 30 mg via INTRAVENOUS
  Administered 2020-07-28: 70 mg via INTRAVENOUS

## 2020-07-28 MED ORDER — BUPIVACAINE HCL (PF) 0.5 % IJ SOLN
INTRAMUSCULAR | Status: AC
  Filled 2020-07-28: qty 30

## 2020-07-28 MED ORDER — LIDOCAINE-EPINEPHRINE 1 %-1:100000 IJ SOLN
INTRAMUSCULAR | Status: DC | PRN
  Administered 2020-07-28: 5 mL

## 2020-07-28 MED ORDER — SERTRALINE HCL 50 MG PO TABS
50.0000 mg | ORAL_TABLET | Freq: Every day | ORAL | Status: DC
  Administered 2020-07-29: 50 mg via ORAL
  Filled 2020-07-28: qty 1

## 2020-07-28 MED ORDER — SODIUM CHLORIDE 0.9 % IV SOLN: 250.0000 mL | INTRAVENOUS | Status: DC

## 2020-07-28 MED ORDER — HYDROMORPHONE HCL 1 MG/ML IJ SOLN
INTRAMUSCULAR | Status: AC
  Filled 2020-07-28: qty 1

## 2020-07-28 MED ORDER — PREGABALIN 50 MG PO CAPS: 50.0000 mg | ORAL_CAPSULE | Freq: Two times a day (BID) | ORAL | Status: DC

## 2020-07-28 MED ORDER — ZOLPIDEM TARTRATE 5 MG PO TABS: 5.0000 mg | ORAL_TABLET | Freq: Every evening | ORAL | Status: DC | PRN

## 2020-07-28 SURGICAL SUPPLY — 81 items
ADH SKN CLS APL DERMABOND .7 (GAUZE/BANDAGES/DRESSINGS) ×1
BAND RUBBER #7 7IN GRN STRL LF (MISCELLANEOUS) ×1 IMPLANT
BASKET BONE COLLECTION (BASKET) ×2 IMPLANT
BLADE CLIPPER SURG (BLADE) IMPLANT
BONE CANC CHIPS 20CC PCAN1/4 (Bone Implant) ×2 IMPLANT
BUR MATCHSTICK NEURO 3.0 LAGG (BURR) ×2 IMPLANT
BUR PRECISION FLUTE 5.0 (BURR) ×2 IMPLANT
CANISTER SUCT 3000ML PPV (MISCELLANEOUS) ×2 IMPLANT
CARTRIDGE OIL MAESTRO DRILL (MISCELLANEOUS) ×1 IMPLANT
CHIPS CANC BONE 20CC PCAN1/4 (Bone Implant) ×1 IMPLANT
CNTNR URN SCR LID CUP LEK RST (MISCELLANEOUS) ×1 IMPLANT
CONT SPEC 4OZ STRL OR WHT (MISCELLANEOUS) ×2
COVER BACK TABLE 24X17X13 BIG (DRAPES) ×1 IMPLANT
COVER BACK TABLE 60X90IN (DRAPES) ×2 IMPLANT
COVER TRANSDUCER ULTRASND 9X24 (MISCELLANEOUS) ×1 IMPLANT
COVER WAND RF STERILE (DRAPES) ×1 IMPLANT
DECANTER SPIKE VIAL GLASS SM (MISCELLANEOUS) ×2 IMPLANT
DERMABOND ADVANCED (GAUZE/BANDAGES/DRESSINGS) ×1
DERMABOND ADVANCED .7 DNX12 (GAUZE/BANDAGES/DRESSINGS) ×1 IMPLANT
DIFFUSER DRILL AIR PNEUMATIC (MISCELLANEOUS) ×2 IMPLANT
DRAPE C-ARM 42X72 X-RAY (DRAPES) ×2 IMPLANT
DRAPE C-ARMOR (DRAPES) ×2 IMPLANT
DRAPE LAPAROTOMY 100X72X124 (DRAPES) ×2 IMPLANT
DRAPE SURG 17X23 STRL (DRAPES) ×2 IMPLANT
DRSG OPSITE POSTOP 4X6 (GAUZE/BANDAGES/DRESSINGS) ×1 IMPLANT
DURAPREP 26ML APPLICATOR (WOUND CARE) ×2 IMPLANT
ELECT REM PT RETURN 9FT ADLT (ELECTROSURGICAL) ×2
ELECTRODE REM PT RTRN 9FT ADLT (ELECTROSURGICAL) ×1 IMPLANT
GAUZE 4X4 16PLY RFD (DISPOSABLE) IMPLANT
GAUZE SPONGE 4X4 12PLY STRL (GAUZE/BANDAGES/DRESSINGS) ×2 IMPLANT
GLOVE BIO SURGEON STRL SZ8 (GLOVE) ×4 IMPLANT
GLOVE BIOGEL PI IND STRL 8.5 (GLOVE) ×2 IMPLANT
GLOVE BIOGEL PI INDICATOR 8.5 (GLOVE) ×2
GLOVE ECLIPSE 8.0 STRL XLNG CF (GLOVE) ×4 IMPLANT
GLOVE EXAM NITRILE XL STR (GLOVE) IMPLANT
GLOVE SRG 8 PF TXTR STRL LF DI (GLOVE) ×2 IMPLANT
GLOVE SURG UNDER POLY LF SZ8 (GLOVE) ×4
GOWN STRL REUS W/ TWL LRG LVL3 (GOWN DISPOSABLE) IMPLANT
GOWN STRL REUS W/ TWL XL LVL3 (GOWN DISPOSABLE) ×2 IMPLANT
GOWN STRL REUS W/TWL 2XL LVL3 (GOWN DISPOSABLE) ×4 IMPLANT
GOWN STRL REUS W/TWL LRG LVL3 (GOWN DISPOSABLE)
GOWN STRL REUS W/TWL XL LVL3 (GOWN DISPOSABLE) ×4
GRAFT BNE CANC CHIPS 1-8 20CC (Bone Implant) IMPLANT
HEMOSTAT POWDER KIT SURGIFOAM (HEMOSTASIS) ×2 IMPLANT
IMPL TLX20 10X11X26 20D (Cage) IMPLANT
KIT BASIN OR (CUSTOM PROCEDURE TRAY) ×2 IMPLANT
KIT INFUSE XX SMALL 0.7CC (Orthopedic Implant) ×1 IMPLANT
KIT POSITION SURG JACKSON T1 (MISCELLANEOUS) ×2 IMPLANT
KIT TURNOVER KIT B (KITS) ×2 IMPLANT
MILL MEDIUM DISP (BLADE) ×1 IMPLANT
NDL HYPO 21X1.5 SAFETY (NEEDLE) IMPLANT
NDL HYPO 25X1 1.5 SAFETY (NEEDLE) ×1 IMPLANT
NDL SPNL 18GX3.5 QUINCKE PK (NEEDLE) IMPLANT
NEEDLE HYPO 21X1.5 SAFETY (NEEDLE) ×2 IMPLANT
NEEDLE HYPO 25X1 1.5 SAFETY (NEEDLE) ×2 IMPLANT
NEEDLE SPNL 18GX3.5 QUINCKE PK (NEEDLE) IMPLANT
NS IRRIG 1000ML POUR BTL (IV SOLUTION) ×2 IMPLANT
OIL CARTRIDGE MAESTRO DRILL (MISCELLANEOUS) ×2
PACK LAMINECTOMY NEURO (CUSTOM PROCEDURE TRAY) ×2 IMPLANT
PAD ARMBOARD 7.5X6 YLW CONV (MISCELLANEOUS) ×6 IMPLANT
PATTIES SURGICAL .5 X.5 (GAUZE/BANDAGES/DRESSINGS) IMPLANT
PATTIES SURGICAL .5 X1 (DISPOSABLE) IMPLANT
PATTIES SURGICAL 1X1 (DISPOSABLE) IMPLANT
ROD RELINE LORDOTIC 5.5X45 (Rod) ×2 IMPLANT
SCREW LOCK RELINE 5.5 TULIP (Screw) ×4 IMPLANT
SCREW RELINE-O POLY 5.5X45MM (Screw) ×4 IMPLANT
SPONGE LAP 4X18 RFD (DISPOSABLE) IMPLANT
SPONGE SURGIFOAM ABS GEL SZ50 (HEMOSTASIS) ×2 IMPLANT
STAPLER SKIN PROX WIDE 3.9 (STAPLE) IMPLANT
SUT VIC AB 1 CT1 18XBRD ANBCTR (SUTURE) ×2 IMPLANT
SUT VIC AB 1 CT1 8-18 (SUTURE) ×2
SUT VIC AB 2-0 CT1 18 (SUTURE) ×4 IMPLANT
SUT VIC AB 3-0 SH 8-18 (SUTURE) ×4 IMPLANT
SYR 20ML LL LF (SYRINGE) ×1 IMPLANT
SYR 3ML LL SCALE MARK (SYRINGE) ×4 IMPLANT
SYR 5ML LL (SYRINGE) IMPLANT
TLX20 IMPLANT 10X11X26 20D (Cage) ×2 IMPLANT
TOWEL GREEN STERILE (TOWEL DISPOSABLE) ×2 IMPLANT
TOWEL GREEN STERILE FF (TOWEL DISPOSABLE) ×2 IMPLANT
TRAY FOLEY MTR SLVR 16FR STAT (SET/KITS/TRAYS/PACK) ×2 IMPLANT
WATER STERILE IRR 1000ML POUR (IV SOLUTION) ×2 IMPLANT

## 2020-07-28 NOTE — Op Note (Signed)
07/28/2020  11:17 AM  PATIENT:  Pamela Torres  55 y.o. 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, lumbar disc herniation, lumbago, lumbar radiculopathy   PROCEDURE:  Procedure(s) with comments: Lumbar Four-Five Transforaminal lumbar interbody fusion with exploration of Lumbar Five- Sacral One Fusion (N/A) - Lumbar Four-Five Transforaminal lumbar interbody fusion with exploration of Lumbar Five- Sacral One Fusion with expandable TLIF cage, autograft, pedicle screw fixation, posterolateral arthrodesis  SURGEON:  Surgeon(s) and Role:    Erline Levine, MD - Primary    * Dawley, Theodoro Doing, DO - Assisting  PHYSICIAN ASSISTANT: Glenford Peers, NP  ASSISTANTS: Poteat, RN   ANESTHESIA:   general  EBL:  200 mL   BLOOD ADMINISTERED:none  DRAINS: none   LOCAL MEDICATIONS USED:  MARCAINE    and LIDOCAINE   SPECIMEN:  No Specimen  DISPOSITION OF SPECIMEN:  N/A  COUNTS:  YES  TOURNIQUET:  * No tourniquets in log *  DICTATION: Patient is 55 year old woman with lumbar stenosis and previous fusion L 5 S 1 level with stenosis at  L 45 and a long history of severe back and bilateral leg pain.  It was elected to take her to surgery for exploration of previous fusion with decompression and fusion L 45 level.   Procedure: Patient was placed in a prone position on the Colt table after smooth and uncomplicated induction of general endotracheal anesthesia. Her low back was prepped and draped in usual sterile fashion with betadine scrub and DuraPrep. Area of incision was infiltrated with local lidocaine. Incision was made to the lumbodorsal fascia was incised and exposure was performed of the L 4 - 5 spinous processes laminae facet joint and transverse processes. Previous hardware was exposed.   The bone scres were removed and appeared to  be solidly in bone and there was dense bridging bone across the L 5  S 1 level.  Intraoperative x-ray was obtained which confirmed correct orientation with marker probes at L 5 throughL 4 levles. A total right hemi-laminectomy of  L 4  levels was performed with disarticulation of the facet joints and thorough decompression was performed of both L 4  And L 5 nerve roots along with the common dural tube. Decompression was greater than would be typical for PLIF. A thorough discectomy was performed at the L 45 level. A preparation of the endplates for grafting a trial spacer was placed this level and a thorough discectomy was performed on the right.  After trial sizing and utilization of sequential shavers, interspace was packed with autograft, an expamdable titanium TLIF spacer was placed (10 x 11 x 26 mm x 20 degrees) and the interpace was packed with local autograft and extra extra small BMP.  The posterolateral region was extensively decorticated and pedicle probes were placed at L 4  through L 5 bilaterally. Intraoperative fluoroscopy confirmed correct orientationin the AP and lateral plane. 45 x 5.5 mm pedicle screws were placed at L 4 bilaterally and 45 x 5.5 mm screws placed at  L 5 bilaterally  Final x-rays demonstrated well-positioned interbody grafts and pedicle screw fixation. A 45 mm lordotic rod was placed on the right and a 45 mm rod was placed on the left locked down in situ and the posterolateral region on the left was packed with 30 cc bone graft and remaining BMP.   The wound was irrigated.  Fascia was closed  with 1 Vicryl sutures skin edges were reapproximated 2 and 3-0 Vicryl sutures. Long-acting Marcaine was injected in the paraspinal musculature.  The wound was dressed with Dermabond and  an occlusive dressing the patient was extubated in the operating room and taken to recovery in stable satisfactory condition she tolerated traction well counts were correct at the end of the case.    PLAN OF  CARE: Admit to inpatient   PATIENT DISPOSITION:  PACU - hemodynamically stable.   Delay start of Pharmacological VTE agent (>24hrs) due to surgical blood loss or risk of bleeding: yes

## 2020-07-28 NOTE — Interval H&P Note (Signed)
History and Physical Interval Note:  07/28/2020 7:34 AM  Pamela Torres  has presented today for surgery, with the diagnosis of Lumbar stenosis without neurogenic claudication.  The various methods of treatment have been discussed with the patient and family. After consideration of risks, benefits and other options for treatment, the patient has consented to  Procedure(s) with comments: Lumbar 4-5 Transforaminal lumbar interbody fusion with exploration of Lumbar 5 Sacral 1 Fusion (N/A) - 3C/RM 21 as a surgical intervention.  The patient's history has been reviewed, patient examined, no change in status, stable for surgery.  I have reviewed the patient's chart and labs.  Questions were answered to the patient's satisfaction.     Peggyann Shoals

## 2020-07-28 NOTE — Progress Notes (Signed)
Orthopedic Tech Progress Note Patient Details:  Pamela Torres 26-Nov-1965 [id number] RN stated "patient has brace' Patient ID: [name], female   DOB: December 26, 1965, 55 y.o.   MRN: [id number]   [name] 07/28/2020, 2:36 PM

## 2020-07-28 NOTE — Evaluation (Signed)
Occupational Therapy Evaluation Patient Details Name: Pamela Torres MRN: [id number] DOB: [birth year] Today's Date: 07/28/2020    History of Present Illness Pt is a 55 y.o. female s/p Lumbar Four-Five Transforaminal lumbar interbody fusion with exploration of Lumbar Five- Sacral One Fusion. PMH significant for anemia, anxiety, chronic back pain, diabetes mellitus, cyst in hand, DVT in pregnancy, gout, pidney stones, sciatic pain, premature atrial contraction, and sleep apnea.   Clinical Impression   PTA, pt was living at home with her husband and was independent in ADLs and IADLs. Currently, pt completes ADLs with supervision. Pt completed ADLs with supervision and functional mobility with supervision-min guard. Provided education and handout on toileting, oral care, shower transfer, brace management, and lower body dressing. Pt verbalized understanding of all information provided. All questions answered. No follow up OT at this time. Recommend d/c home with assistance from husband as needed.     Follow Up Recommendations  No OT follow up;Supervision/Assistance - 24 hour    Equipment Recommendations  3 in 1 bedside commode    Recommendations for Other Services PT consult     Precautions / Restrictions Precautions Precautions: Back Precaution Booklet Issued: Yes (comment) Precaution Comments: Pt provided back precaution handout. Required Braces or Orthoses: Spinal Brace Spinal Brace: Lumbar corset Restrictions Weight Bearing Restrictions: No Other Position/Activity Restrictions: Bo bending, lifting, twisting      Mobility Bed Mobility Overal bed mobility: Needs Assistance Bed Mobility: Rolling;Sidelying to Sit;Sit to Sidelying Rolling: Supervision Sidelying to sit: Min guard     Sit to sidelying: Min guard General bed mobility comments: Pt required min guard for sidelying to sit and sit to sidelying for safety.    Transfers Overall transfer level: Needs  assistance Equipment used: None Transfers: Sit to/from Stand Sit to Stand: Supervision         General transfer comment: Pt required supervision to min guard due to report of nausea and unsteadiness on feet    Balance Overall balance assessment: Needs assistance Sitting-balance support: Feet supported Sitting balance-Leahy Scale: Good Sitting balance - Comments: Pt able to lift foot to figure four position with min guard for safety   Standing balance support: During functional activity;No upper extremity supported Standing balance-Leahy Scale: Fair Standing balance comment: Pt able to complete shower transfer holding on to wall. Pt completed functional mobility with supervision/min guard after report of nausea with activity.                           ADL either performed or assessed with clinical judgement   ADL Overall ADL's : Needs assistance/impaired Eating/Feeding: Modified independent;Sitting   Grooming: Supervision/safety;Standing   Upper Body Bathing: Supervision/ safety;Sitting   Lower Body Bathing: Supervison/ safety;Sit to/from stand   Upper Body Dressing : Sitting;Supervision/safety Upper Body Dressing Details (indicate cue type and reason): Pt able to don gown on back side and apply lumbar corset brace Lower Body Dressing: Supervision/safety;Sit to/from stand Lower Body Dressing Details (indicate cue type and reason): Pt able to achieve figure 4 position to don socks and thread pants. Pt reports husband is available and willing to help don socks if needed. Toilet Transfer: Supervision/safety;Ambulation     Toileting - Clothing Manipulation Details (indicate cue type and reason): Pt educated on toilet hygiene within back precautions. Tub/ Shower Transfer: Walk-in shower;Min guard;Ambulation Tub/Shower Transfer Details (indicate cue type and reason): Pt completed shower transfer with min guard for safety. Functional mobility during ADLs:  Supervision/safety;Min guard (Min guard  following pt report of nausea.) General ADL Comments: Pt reported nausea following functional mobility in the hallway and shower transfer. Pt declined a rest break. Pt with unsteadiness in hallway but was able to self correct. Pt educated on oral care and toileting within precautions.     Vision Baseline Vision/History: Wears glasses Wears Glasses: At all times Vision Assessment?: No apparent visual deficits     Perception Perception Perception Tested?: No Comments: no apparent perception difficulties   Praxis Praxis Praxis tested?: Not tested Praxis-Other Comments: No apparent difficulties with motor planning.    Pertinent Vitals/Pain Pain Assessment: Faces Faces Pain Scale: Hurts a little bit Pain Location: back Pain Intervention(s): Limited activity within patient's tolerance;Monitored during session;Repositioned;Ice applied     Hand Dominance Right   Extremity/Trunk Assessment Upper Extremity Assessment Upper Extremity Assessment: Overall WFL for tasks assessed   Lower Extremity Assessment Lower Extremity Assessment: Overall WFL for tasks assessed   Cervical / Trunk Assessment Cervical / Trunk Assessment: Other exceptions (L4-L5 fusion)   Communication Communication Communication: No difficulties   Cognition Arousal/Alertness: Awake/alert Behavior During Therapy: WFL for tasks assessed/performed Overall Cognitive Status: Within Functional Limits for tasks assessed                                 General Comments: Pt conversational throughout session.   General Comments  Pt reports she is "able to feel her foot again" and stated she could not feel her right foot prior to surgery. Pt denied any abnormal sensation or pain in foot. Daughter present throughout session.    Exercises     Shoulder Instructions      Home Living Family/patient expects to be discharged to:: Private residence Living Arrangements:  Spouse/significant other Available Help at Discharge: Family;Available 24 hours/day (Husband) Type of Home: Mobile home Home Access: Stairs to enter Entrance Stairs-Number of Steps: 4 Entrance Stairs-Rails: Right;Left Home Layout: One level     Bathroom Shower/Tub: Occupational psychologist: Standard         Additional Comments: Pt reports husband available 24/7      Prior Functioning/Environment Level of Independence: Independent                 OT Problem List: Decreased strength;Decreased activity tolerance;Impaired balance (sitting and/or standing);Decreased knowledge of precautions;Obesity;Pain      OT Treatment/Interventions:      OT Goals(Current goals can be found in the care plan section) Acute Rehab OT Goals Patient Stated Goal: "to go home" OT Goal Formulation: With patient/family Time For Goal Achievement: 08/11/20 Potential to Achieve Goals: Good  OT Frequency:     Barriers to D/C:            Co-evaluation              AM-PAC OT "6 Clicks" Daily Activity     Outcome Measure Help from another person eating meals?: None Help from another person taking care of personal grooming?: None Help from another person toileting, which includes using toliet, bedpan, or urinal?: A Little Help from another person bathing (including washing, rinsing, drying)?: A Little Help from another person to put on and taking off regular upper body clothing?: None Help from another person to put on and taking off regular lower body clothing?: A Little 6 Click Score: 21   End of Session Equipment Utilized During Treatment: Gait belt;Back brace Nurse Communication: Mobility status  Activity Tolerance: Patient tolerated  treatment well Patient left: in bed;with call bell/phone within reach;with family/visitor present  OT Visit Diagnosis: Unsteadiness on feet (R26.81);Muscle weakness (generalized) (M62.81);Pain Pain - part of body:  (back)                 Time: 7530-0511 OT Time Calculation (min): 26 min Charges:     Shanda Howells, OTDS  Shanda Howells 07/28/2020, 5:35 PM

## 2020-07-28 NOTE — Anesthesia Postprocedure Evaluation (Signed)
Anesthesia Post Note  Patient: Pamela Torres  Procedure(s) Performed: Lumbar Four-Five Transforaminal lumbar interbody fusion with exploration of Lumbar Five- Sacral One Fusion (N/A Back)     Patient location during evaluation: PACU Anesthesia Type: General Level of consciousness: awake and alert Pain management: pain level controlled Vital Signs Assessment: post-procedure vital signs reviewed and stable Respiratory status: spontaneous breathing, nonlabored ventilation, respiratory function stable and patient connected to nasal cannula oxygen Cardiovascular status: blood pressure returned to baseline and stable Postop Assessment: no apparent nausea or vomiting Anesthetic complications: no   No complications documented.  Last Vitals:  Vitals:   07/28/20 1233 07/28/20 1307  BP: 137/64 122/67  Pulse: 80 74  Resp: 14 18  Temp:  36.7 C  SpO2: 93% 98%    Last Pain:  Vitals:   07/28/20 1458  TempSrc:   PainSc: 6                  [name] S

## 2020-07-28 NOTE — Progress Notes (Signed)
Patient is awake, alert, conversant.  Appropriately sore in low back.  Legs good without pain, numbness or weakness.  Patient is doing well.

## 2020-07-28 NOTE — Brief Op Note (Signed)
07/28/2020  11:17 AM  PATIENT:  Pamela Torres  55 y.o. 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, lumbar disc herniation, lumbago, lumbar radiculopathy   PROCEDURE:  Procedure(s) with comments: Lumbar Four-Five Transforaminal lumbar interbody fusion with exploration of Lumbar Five- Sacral One Fusion (N/A) - Lumbar Four-Five Transforaminal lumbar interbody fusion with exploration of Lumbar Five- Sacral One Fusion with expandable TLIF cage, autograft, pedicle screw fixation, posterolateral arthrodesis  SURGEON:  Surgeon(s) and Role:    Erline Levine, MD - Primary    * Dawley, Theodoro Doing, DO - Assisting  PHYSICIAN ASSISTANT: Glenford Peers, NP  ASSISTANTS: Poteat, RN   ANESTHESIA:   general  EBL:  200 mL   BLOOD ADMINISTERED:none  DRAINS: none   LOCAL MEDICATIONS USED:  MARCAINE    and LIDOCAINE   SPECIMEN:  No Specimen  DISPOSITION OF SPECIMEN:  N/A  COUNTS:  YES  TOURNIQUET:  * No tourniquets in log *  DICTATION: Patient is 55 year old woman with lumbar stenosis and previous fusion L 5 S 1 level with stenosis at  L 45 and a long history of severe back and bilateral leg pain.  It was elected to take her to surgery for exploration of previous fusion with decompression and fusion L 45 level.   Procedure: Patient was placed in a prone position on the [place] table after smooth and uncomplicated induction of general endotracheal anesthesia. Her low back was prepped and draped in usual sterile fashion with betadine scrub and DuraPrep. Area of incision was infiltrated with local lidocaine. Incision was made to the lumbodorsal fascia was incised and exposure was performed of the L 4 - 5 spinous processes laminae facet joint and transverse processes. Previous hardware was exposed.   The bone scres were removed and appeared to  be solidly in bone and there was dense bridging bone across the L 5  S 1 level.  Intraoperative x-ray was obtained which confirmed correct orientation with marker probes at L 5 throughL 4 levles. A total right hemi-laminectomy of  L 4  levels was performed with disarticulation of the facet joints and thorough decompression was performed of both L 4  And L 5 nerve roots along with the common dural tube. Decompression was greater than would be typical for PLIF. A thorough discectomy was performed at the L 45 level. A preparation of the endplates for grafting a trial spacer was placed this level and a thorough discectomy was performed on the right.  After trial sizing and utilization of sequential shavers, interspace was packed with autograft, an expamdable titanium TLIF spacer was placed (10 x 11 x 26 mm x 20 degrees) and the interpace was packed with local autograft and extra extra small BMP.  The posterolateral region was extensively decorticated and pedicle probes were placed at L 4  through L 5 bilaterally. Intraoperative fluoroscopy confirmed correct orientationin the AP and lateral plane. 45 x 5.5 mm pedicle screws were placed at L 4 bilaterally and 45 x 5.5 mm screws placed at  L 5 bilaterally  Final x-rays demonstrated well-positioned interbody grafts and pedicle screw fixation. A 45 mm lordotic rod was placed on the right and a 45 mm rod was placed on the left locked down in situ and the posterolateral region on the left was packed with 30 cc bone graft and remaining BMP.   The wound was irrigated.  Fascia was closed  with 1 Vicryl sutures skin edges were reapproximated 2 and 3-0 Vicryl sutures. Long-acting Marcaine was injected in the paraspinal musculature.  The wound was dressed with Dermabond and  an occlusive dressing the patient was extubated in the operating room and taken to recovery in stable satisfactory condition she tolerated traction well counts were correct at the end of the case.    PLAN OF  CARE: Admit to inpatient   PATIENT DISPOSITION:  PACU - hemodynamically stable.   Delay start of Pharmacological VTE agent (>24hrs) due to surgical blood loss or risk of bleeding: yes

## 2020-07-28 NOTE — Transfer of Care (Signed)
Immediate Anesthesia Transfer of Care Note  Patient: Pamela Torres  Procedure(s) Performed: Lumbar Four-Five Transforaminal lumbar interbody fusion with exploration of Lumbar Five- Sacral One Fusion (N/A Back)  Patient Location: PACU  Anesthesia Type:General  Level of Consciousness: drowsy and patient cooperative  Airway & Oxygen Therapy: Patient Spontanous Breathing and Patient connected to face mask oxygen  Post-op Assessment: Report given to RN and Post -op Vital signs reviewed and stable  Post vital signs: Reviewed and stable  Last Vitals:  Vitals Value Taken Time  BP 135/64 07/28/20 1118  Temp    Pulse 88 07/28/20 1121  Resp 11 07/28/20 1121  SpO2 93 % 07/28/20 1121  Vitals shown include unvalidated device data.  Last Pain:  Vitals:   07/28/20 0607  TempSrc:   PainSc: 3       Patients Stated Pain Goal: 2 (58/94/83 4758)  Complications: No complications documented.

## 2020-07-28 NOTE — Anesthesia Procedure Notes (Signed)
Procedure Name: Intubation Date/Time: 07/28/2020 7:44 AM Performed by: Renato Shin, CRNA Pre-anesthesia Checklist: Patient identified, Emergency Drugs available, Suction available and Patient being monitored Patient Re-evaluated:Patient Re-evaluated prior to induction Oxygen Delivery Method: Circle system utilized Preoxygenation: Pre-oxygenation with 100% oxygen Induction Type: IV induction Ventilation: Mask ventilation without difficulty Laryngoscope Size: Miller and 3 Grade View: Grade I Tube type: Oral Tube size: 7.0 mm Number of attempts: 1 Airway Equipment and Method: Stylet and Oral airway Placement Confirmation: ETT inserted through vocal cords under direct vision,  positive ETCO2 and breath sounds checked- equal and bilateral Secured at: 21 cm Tube secured with: Tape Dental Injury: Teeth and Oropharynx as per pre-operative assessment

## 2020-07-29 LAB — GLUCOSE, CAPILLARY: Glucose-Capillary: 114 mg/dL — ABNORMAL HIGH (ref 70–99)

## 2020-07-29 MED ORDER — OXYCODONE-ACETAMINOPHEN 5-325 MG PO TABS: 1.0000 | ORAL_TABLET | Freq: Four times a day (QID) | ORAL | 0 refills | Status: DC | PRN

## 2020-07-29 MED ORDER — METHOCARBAMOL 500 MG PO TABS: 500.0000 mg | ORAL_TABLET | Freq: Four times a day (QID) | ORAL | 0 refills | Status: DC | PRN

## 2020-07-29 NOTE — Progress Notes (Signed)
Patient is discharged from room 3C09 at this time. Alert and in stable condition. IV site d/c'd and instructions read to patient and daughter with understanding verbalized and all questions answered. Left unit via wheelchair with all belongings at side.

## 2020-07-29 NOTE — Evaluation (Signed)
Physical Therapy Evaluation and Discharge Patient Details Name: Pamela Torres Sensabaugh MRN: [id number] DOB: 20-Jan-1966 Today's Date: 07/29/2020   History of Present Illness  Pt is a 55 y.o. female who presents s/p L5-S1 TLIF on 07/28/2020. PMH significant for anemia, diabetes mellitus, DVT in pregnancy, gout, premature atrial contraction, and sleep apnea.  Clinical Impression  Patient evaluated by Physical Therapy with no further acute PT needs identified. All education has been completed and the patient has no further questions. Pt was able to demonstrate transfers and ambulation with gross supervision for safety and no AD. Pt will have daughter and husband available at home to assist her as needed. Pt was educated on precautions, brace application/wearing schedule, appropriate activity progression, and car transfer. See below for any follow-up Physical Therapy or equipment needs. PT is signing off. Thank you for this referral.     Follow Up Recommendations No PT follow up;Supervision for mobility/OOB    Equipment Recommendations  None recommended by PT    Recommendations for Other Services       Precautions / Restrictions Precautions Precautions: Back Precaution Booklet Issued: Yes (comment) Precaution Comments: Pt provided back precaution handout. Required Braces or Orthoses: Spinal Brace Spinal Brace: Lumbar corset Restrictions Weight Bearing Restrictions: No      Mobility  Bed Mobility Overal bed mobility: Needs Assistance Bed Mobility: Rolling;Sidelying to Sit Rolling: Modified independent (Device/Increase time) Sidelying to sit: Min guard       General bed mobility comments: HOB flat and rails lowered to simulate home environment. Light guard at trunk during transition to full sitting position due to posterior lean.    Transfers Overall transfer level: Needs assistance Equipment used: None Transfers: Sit to/from Stand Sit to Stand: Supervision         General transfer  comment: Pt required supervision to min guard due to report of nausea and unsteadiness on feet  Ambulation/Gait Ambulation/Gait assistance: Supervision Gait Distance (Feet): 250 Feet Assistive device: None Gait Pattern/deviations: Step-through pattern;Decreased stride length;Trunk flexed Gait velocity: Decreased Gait velocity interpretation: <1.31 ft/sec, indicative of household ambulator General Gait Details: VC's for improved posture throughout. Initially moving very slow but able to improve gait speed as distance progressed.  Stairs Stairs: Yes Stairs assistance: Min guard Stair Management: One rail Left;Step to pattern;Backwards Number of Stairs: 4 General stair comments: VC's for sequencing and general safety. Pt was able to negotiate stairs well without unsteadiness or LOB noted.  Wheelchair Mobility    Modified Rankin (Stroke Patients Only)       Balance Overall balance assessment: Needs assistance Sitting-balance support: Feet supported Sitting balance-Leahy Scale: Fair Sitting balance - Comments: Mild posterior lean at times in sitting   Standing balance support: During functional activity;No upper extremity supported Standing balance-Leahy Scale: Fair                               Pertinent Vitals/Pain Pain Assessment: 0-10 Pain Score: 4  Pain Location: back Pain Descriptors / Indicators: Operative site guarding;Sore Pain Intervention(s): Limited activity within patient's tolerance;Monitored during session;Repositioned    Home Living Family/patient expects to be discharged to:: Private residence Living Arrangements: Spouse/significant other Available Help at Discharge: Family;Available 24 hours/day Type of Home: Mobile home Home Access: Stairs to enter Entrance Stairs-Rails: Right;Left Entrance Stairs-Number of Steps: 4 Home Layout: One level        Prior Function Level of Independence: Independent         Comments: Pt  reports  independence in self care ADL, IADL, and driving.     Hand Dominance   Dominant Hand: Right    Extremity/Trunk Assessment   Upper Extremity Assessment Upper Extremity Assessment: Defer to OT evaluation    Lower Extremity Assessment Lower Extremity Assessment: Generalized weakness (Consistent with pre-op diagnosis)    Cervical / Trunk Assessment Cervical / Trunk Assessment: Other exceptions (L4-L5 fusion)  Communication   Communication: No difficulties  Cognition Arousal/Alertness: Awake/alert Behavior During Therapy: WFL for tasks assessed/performed Overall Cognitive Status: Within Functional Limits for tasks assessed                                        General Comments      Exercises     Assessment/Plan    PT Assessment Patent does not need any further PT services  PT Problem List         PT Treatment Interventions      PT Goals (Current goals can be found in the Care Plan section)  Acute Rehab PT Goals Patient Stated Goal: "to go home" PT Goal Formulation: All assessment and education complete, DC therapy    Frequency     Barriers to discharge        Co-evaluation               AM-PAC PT "6 Clicks" Mobility  Outcome Measure Help needed turning from your back to your side while in a flat bed without using bedrails?: None Help needed moving from lying on your back to sitting on the side of a flat bed without using bedrails?: A Little Help needed moving to and from a bed to a chair (including a wheelchair)?: A Little Help needed standing up from a chair using your arms (e.g., wheelchair or bedside chair)?: A Little Help needed to walk in hospital room?: A Little Help needed climbing 3-5 steps with a railing? : A Little 6 Click Score: 19    End of Session Equipment Utilized During Treatment: Gait belt;Back brace Activity Tolerance: Patient tolerated treatment well Patient left: with call bell/phone within reach;with  family/visitor present (Sitting EOB to dress prior to d/c.) Nurse Communication: Mobility status PT Visit Diagnosis: Unsteadiness on feet (R26.81);Pain Pain - part of body:  (back)    Time: 0623-7628 PT Time Calculation (min) (ACUTE ONLY): 15 min   Charges:   PT Evaluation $PT Eval Low Complexity: 1 Low          Rolinda Roan, PT, DPT Acute Rehabilitation Services Pager: [phone] Office: [phone]   Thelma Comp 07/29/2020, 1:10 PM

## 2020-07-29 NOTE — Progress Notes (Signed)
Subjective: Patient reports mild incisional pain that is well controlled on PO analgesics. No acute events overnight.   Objective: Vital signs in last 24 hours: Temp:  [97.2 F (36.2 C)-98.3 F (36.8 C)] 97.8 F (36.6 C) (06/01 0732) Pulse Rate:  [68-85] 71 (06/01 0732) Resp:  [13-20] 18 (06/01 0732) BP: (99-137)/(54-71) 123/63 (06/01 0732) SpO2:  [92 %-99 %] 97 % (06/01 0732)  Intake/Output from previous day: 05/31 0701 - 06/01 0700 In: 1990 [P.O.:240; I.V.:1200; IV Piggyback:550] Out: 450 [Urine:250; Blood:200] Intake/Output this shift: No intake/output data recorded.  Physical Exam: Patient is awake, A/O X 4, conversant, and in good spirits. Speech is fluent and appropriate. Doing well. MAEW with good strength that is symmetric bilaterally. 5/5 BUE/BLE. Dressing is clean dry intact. Incision is well approximated with no drainage, erythema, or edema.  Lab Results: No results for input(s): WBC, HGB, HCT, PLT in the last 72 hours. BMET No results for input(s): NA, K, CL, CO2, GLUCOSE, BUN, CREATININE, CALCIUM in the last 72 hours.  Studies/Results: DG Lumbar Spine 2-3 Views  Result Date: 07/28/2020 CLINICAL DATA:  L4-L5 PLIF. EXAM: DG C-ARM 1-60 MIN; LUMBAR SPINE - 2-3 VIEW FLUOROSCOPY TIME:  Fluoroscopy Time:  31 seconds. Radiation Exposure Index (if provided by the fluoroscopic device): 23.11 mGy. Number of Acquired Spot Images: 2 COMPARISON:  MRI 03/05/2020. FINDINGS: Two C-arm fluoroscopic images were obtained intraoperatively and submitted for post operative interpretation. These images demonstrate bilateral pedicle screws at L4 and L5 with intervening L4-L5 spacer. No unexpected findings. Redemonstrated previous spacer at L5-S1. Please see the performing provider's procedural report for further detail. IMPRESSION: Intraoperative fluoroscopy, as detailed above. Electronically Signed   By: Margaretha Sheffield MD   On: 07/28/2020 11:34   DG C-Arm 1-60 Min  Result Date:  07/28/2020 CLINICAL DATA:  L4-L5 PLIF. EXAM: DG C-ARM 1-60 MIN; LUMBAR SPINE - 2-3 VIEW FLUOROSCOPY TIME:  Fluoroscopy Time:  31 seconds. Radiation Exposure Index (if provided by the fluoroscopic device): 23.11 mGy. Number of Acquired Spot Images: 2 COMPARISON:  MRI 03/05/2020. FINDINGS: Two C-arm fluoroscopic images were obtained intraoperatively and submitted for post operative interpretation. These images demonstrate bilateral pedicle screws at L4 and L5 with intervening L4-L5 spacer. No unexpected findings. Redemonstrated previous spacer at L5-S1. Please see the performing provider's procedural report for further detail. IMPRESSION: Intraoperative fluoroscopy, as detailed above. Electronically Signed   By: Margaretha Sheffield MD   On: 07/28/2020 11:34    Assessment/Plan: Patient is post-op day 1 s/p L4/5 TLIF with exploration of L5/S1. She is recovering well and reports a significant reduction in his preoperative symptoms.  Her only complaint is mild incisional discomfort that is well controlled on PO analgesics. She has ambulated well with nursing staff. She is awaiting PT evaluation. OT recommending no OT follow up needed. Continue LSO brace when OOB. Continue working on pain control, mobility and ambulating patient. Will plan for discharge today.    LOS: 1 day     Pamela Moeller, DNP, NP-C 07/29/2020, 7:53 AM

## 2020-07-29 NOTE — Discharge Instructions (Signed)
Wound Care Leave incision open to air. You Glauber shower. Do not scrub directly on incision.  Do not put any creams, lotions, or ointments on incision. Activity Walk each and every day, increasing distance each day. No lifting greater than 5 lbs.  Avoid bending, arching, and twisting. No driving for 2 weeks; [place] ride as a passenger locally. If provided with back brace, wear when out of bed.  It is not necessary to wear in bed. Diet Resume your normal diet.  Return to Work Will be discussed at you follow up appointment. Call Your Doctor If Any of These Occur Redness, drainage, or swelling at the wound.  Temperature greater than 101 degrees. Severe pain not relieved by pain medication. Incision starts to come apart. Follow Up Appt Call today for appointment in 3-4 weeks ([phone]) or for problems.  If you have any hardware placed in your spine, you will need an x-ray before your appointment.  

## 2020-07-29 NOTE — Discharge Summary (Signed)
Physician Discharge Summary  Patient ID: Pamela Torres MRN: [id number] DOB/AGE: September 08, 1965 55 y.o.  Admit date: 07/28/2020 Discharge date: 07/29/2020  Admission Diagnoses: Lumbar stenosis without neurogenic claudication, lumbar disc herniation, lumbago, lumbar radiculopathy  Discharge Diagnoses:  Lumbar stenosis without neurogenic claudication, lumbar disc herniation, lumbago, lumbar radiculopathy  Active Problems:   Degenerative lumbar spinal stenosis   Discharged Condition: good  Hospital Course: The patient was admitted on 07/28/2020 and taken to the operating room where the patient underwent decompression and fusion. The patient tolerated the procedure well and was taken to the recovery room and then to the floor in stable condition. The hospital course was routine. There were no complications. The wound remained clean dry and intact. Pt had appropriate back soreness. No complaints of leg pain or new N/T/W. The patient remained afebrile with stable vital signs, and tolerated a regular diet. The patient continued to increase activities, and pain was well controlled with oral pain medications.   Consults: None  Significant Diagnostic Studies: radiology: X-ray  Treatments: surgery: Lumbar Four-Five Transforaminal lumbar interbody fusion with exploration of Lumbar Five- Sacral One Fusion (N/A) - Lumbar Four-Five Transforaminal lumbar interbody fusion with exploration of Lumbar Five- Sacral One Fusion with expandable TLIF cage, autograft, pedicle screw fixation, posterolateral arthrodesis  Discharge Exam: Blood pressure 123/63, pulse 71, temperature 97.8 F (36.6 C), temperature source Oral, resp. rate 18, height 5\' 3"  (1.6 m), weight 93.1 kg, SpO2 97 %.  Physical Exam: Patient is awake, A/O X 4, conversant, and in good spirits. Speech is fluent and appropriate. Doing well. MAEW with good strength that is symmetric bilaterally. 5/5 BUE/BLE. Dressing is clean dry intact. Incision is well  approximated with no drainage, erythema, or edema.  Disposition: Discharge disposition: 01-Home or Self Care        Allergies as of 07/29/2020      Reactions   Simvastatin Other (See Comments)   Leg cramps       Medication List    TAKE these medications   gabapentin 300 MG capsule Commonly known as: NEURONTIN Take 300-600 mg by mouth See admin instructions. Take 300 mg by mouth in the morning and afternoon and 600 mg at bedtime   metFORMIN 500 MG tablet Commonly known as: GLUCOPHAGE Take 500 mg by mouth 2 (two) times daily.   methocarbamol 500 MG tablet Commonly known as: ROBAXIN Take 1 tablet (500 mg total) by mouth every 6 (six) hours as needed for muscle spasms.   oxyCODONE-acetaminophen 5-325 MG tablet Commonly known as: Percocet Take 1-2 tablets by mouth every 6 (six) hours as needed for severe pain.   Ozempic (1 MG/DOSE) 4 MG/3ML Sopn Generic drug: Semaglutide (1 MG/DOSE) Inject 1 mg into the skin every Wednesday.   pregabalin 50 MG capsule Commonly known as: Lyrica 50 mg in morning, 50 mg afternoon, 100 mg qhs   sertraline 50 MG tablet Commonly known as: ZOLOFT Take 50 mg by mouth daily.   VITAMIN D3 PO Take 1 capsule by mouth daily.        Signed: [name], DNP, NP-C 07/29/2020, 8:45 AM

## 2020-11-12 ENCOUNTER — Other Ambulatory Visit: Payer: Self-pay

## 2020-11-12 ENCOUNTER — Ambulatory Visit
Admission: RE | Admit: 2020-11-12 | Discharge: 2020-11-12 | Disposition: A | Discharge status: Home / Self Care | Payer: Medicare HMO | Source: Ambulatory Visit | Attending: Family Medicine | Admitting: Family Medicine

## 2020-11-12 DIAGNOSIS — Z1231 Encounter for screening mammogram for malignant neoplasm of breast: Secondary | ICD-10-CM

## 2020-11-23 ENCOUNTER — Other Ambulatory Visit: Payer: Self-pay

## 2020-11-23 ENCOUNTER — Emergency Department: Payer: Medicare HMO

## 2020-11-23 ENCOUNTER — Encounter: Payer: Self-pay | Admitting: Radiology

## 2020-11-23 ENCOUNTER — Emergency Department
Admission: EM | Admit: 2020-11-23 | Discharge: 2020-11-23 | Disposition: A | Discharge status: Home / Self Care | Payer: Medicare HMO | Attending: Emergency Medicine | Admitting: Emergency Medicine

## 2020-11-23 DIAGNOSIS — Z7984 Long term (current) use of oral hypoglycemic drugs: Secondary | ICD-10-CM | POA: Diagnosis not present

## 2020-11-23 DIAGNOSIS — E119 Type 2 diabetes mellitus without complications: Secondary | ICD-10-CM | POA: Insufficient documentation

## 2020-11-23 DIAGNOSIS — Z794 Long term (current) use of insulin: Secondary | ICD-10-CM | POA: Insufficient documentation

## 2020-11-23 DIAGNOSIS — M79604 Pain in right leg: Secondary | ICD-10-CM | POA: Insufficient documentation

## 2020-11-23 LAB — CBC WITH DIFFERENTIAL/PLATELET
Abs Immature Granulocytes: 0.04 10*3/uL (ref 0.00–0.07)
Basophils Absolute: 0 10*3/uL (ref 0.0–0.1)
Basophils Relative: 0 %
Eosinophils Absolute: 0.2 10*3/uL (ref 0.0–0.5)
Eosinophils Relative: 2 %
HCT: 37.7 % (ref 36.0–46.0)
Hemoglobin: 12.4 g/dL (ref 12.0–15.0)
Immature Granulocytes: 1 %
Lymphocytes Relative: 30 %
Lymphs Abs: 2.6 10*3/uL (ref 0.7–4.0)
MCH: 27.9 pg (ref 26.0–34.0)
MCHC: 32.9 g/dL (ref 30.0–36.0)
MCV: 84.7 fL (ref 80.0–100.0)
Monocytes Absolute: 0.6 10*3/uL (ref 0.1–1.0)
Monocytes Relative: 7 %
Neutro Abs: 5.4 10*3/uL (ref 1.7–7.7)
Neutrophils Relative %: 60 %
Platelets: 274 10*3/uL (ref 150–400)
RBC: 4.45 MIL/uL (ref 3.87–5.11)
RDW: 14.5 % (ref 11.5–15.5)
WBC: 8.8 10*3/uL (ref 4.0–10.5)
nRBC: 0 % (ref 0.0–0.2)

## 2020-11-23 LAB — BASIC METABOLIC PANEL
Anion gap: 6 (ref 5–15)
BUN: 14 mg/dL (ref 6–20)
CO2: 27 mmol/L (ref 22–32)
Calcium: 9 mg/dL (ref 8.9–10.3)
Chloride: 105 mmol/L (ref 98–111)
Creatinine, Ser: 0.75 mg/dL (ref 0.44–1.00)
GFR, Estimated: >60 mL/min (ref >60)
Glucose, Bld: 105 mg/dL — ABNORMAL HIGH (ref 70–99)
Potassium: 4 mmol/L (ref 3.5–5.1)
Sodium: 138 mmol/L (ref 135–145)

## 2020-11-23 LAB — CK: Total CK: 111 U/L (ref 38–234)

## 2020-11-23 MED ORDER — OXYCODONE-ACETAMINOPHEN 5-325 MG PO TABS
1.0000 | ORAL_TABLET | Freq: Once | ORAL | Status: AC
  Administered 2020-11-23: 1 via ORAL
  Filled 2020-11-23: qty 1

## 2020-11-23 MED ORDER — HYDROCODONE-ACETAMINOPHEN 5-325 MG PO TABS: 1.0000 | ORAL_TABLET | Freq: Four times a day (QID) | ORAL | 0 refills | Status: AC | PRN

## 2020-11-23 MED ORDER — IBUPROFEN 400 MG PO TABS
400.0000 mg | ORAL_TABLET | Freq: Once | ORAL | Status: AC
  Administered 2020-11-23: 400 mg via ORAL
  Filled 2020-11-23: qty 1

## 2020-11-23 MED ORDER — IOHEXOL 350 MG/ML SOLN
125.0000 mL | Freq: Once | INTRAVENOUS | Status: AC | PRN
  Administered 2020-11-23: 125 mL via INTRAVENOUS
  Filled 2020-11-23: qty 125

## 2020-11-23 NOTE — ED Provider Notes (Signed)
[facility] Emergency Department Provider Note  ____________________________________________   Event Date/Time   First MD Initiated Contact with Patient 11/23/20 1024     (approximate)  I have reviewed the triage vital signs and the nursing notes.   HISTORY  Chief Complaint Leg Swelling   HPI Pamela Torres is a 55 y.o. female no past medical history of anemia, anxiety, DM, chronic pain, kidney stones, migraine headaches, sciatica and remote DVT who presents after being referred to the ED from clinical clinic for further evaluation of right leg pain.  Symptoms been going on for about 1 week.  Is aggravated by any weightbearing or bending of the knee.  Patient is adamant she did not injure leg or fall.  No fevers, chills, headache, earache, sore throat, chest pain, Donnell pain, back pain, vomiting, diarrhea or pain in the right hip or ankle as it seems to be primarily centered around the knee especially around the posterior aspect.  No rashes.  No history of gout.         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   Lumbar radicular pain 05/20/2020   Herniated lumbar intervertebral disc 12/10/2013    Past Surgical History:  Procedure Laterality Date   ABDOMINAL HYSTERECTOMY     BACK SURGERY  2014   microdiskectomy - lumbar   CARDIAC CATHETERIZATION     01/19/11 [facility]): normal cononary  anatomy, EF 64%   CESAREAN SECTION     KNEE SURGERY Right    arthroscopy   LUMBAR LAMINECTOMY/DECOMPRESSION MICRODISCECTOMY Right 12/10/2013   Procedure: Right Lumbar Five to Sacral One Redo Microdiskectomy;  Surgeon: Erline Levine, MD;  Location: [place] NEURO ORS;  Service: Neurosurgery;  Laterality: Right;  Right L5-S1 Redo Microdiskectomy   MAXIMUM ACCESS (MAS)POSTERIOR LUMBAR INTERBODY FUSION (PLIF) 1 LEVEL N/A 06/26/2014   Procedure: Lumbar five sacral one Maximum Access Posterior Lumbar Interbody Fusion;  Surgeon: Erline Levine, MD;  Location: [place] NEURO ORS;  Service: Neurosurgery;  Laterality: N/A;   ROTATOR CUFF REPAIR Right    TRANSFORAMINAL LUMBAR INTERBODY FUSION (TLIF) WITH PEDICLE SCREW FIXATION 1 LEVEL N/A 07/28/2020   Procedure: Lumbar Four-Five Transforaminal lumbar interbody fusion with exploration of Lumbar Five- Sacral One Fusion;  Surgeon: Erline Levine, MD;  Location: [place];  Service: Neurosurgery;  Laterality: N/A;  Lumbar Four-Five Transforaminal lumbar interbody fusion with exploration of Lumbar Five- Sacral One Fusion    Prior to Admission medications   Medication Sig Start Date End Date Taking? Authorizing Provider  HYDROcodone-acetaminophen (NORCO) 5-325 MG tablet Take 1 tablet by mouth every 6 (six) hours as needed for up to 3 days for severe pain. 11/23/20 11/26/20 Yes Lucrezia Starch, MD  Cholecalciferol (VITAMIN D3 PO) Take  1 capsule by mouth daily.    [provider]  gabapentin (NEURONTIN) 300 MG capsule Take 300-600 mg by mouth See admin instructions. Take 300 mg by mouth in the morning and afternoon and 600 mg at bedtime 07/03/20   [provider]  metFORMIN (GLUCOPHAGE) 500 MG tablet Take 500 mg by mouth 2 (two) times daily.    [provider]  methocarbamol (ROBAXIN) 500 MG tablet Take 1 tablet (500 mg total) by mouth every 6 (six) hours as needed for muscle spasms. 07/29/20   [name], NP  pregabalin (LYRICA) 50 MG capsule 50 mg in  morning, 50 mg afternoon, 100 mg qhs 05/20/20   Gillis Santa, MD  Semaglutide, 1 MG/DOSE, (OZEMPIC, 1 MG/DOSE,) 4 MG/3ML SOPN Inject 1 mg into the skin every Wednesday.    [provider]  sertraline (ZOLOFT) 50 MG tablet Take 50 mg by mouth daily. 03/24/20   [provider]    Allergies Simvastatin  Family History  Problem Relation Age of Onset   Thyroid disease Mother    COPD Mother    Heart attack Father    Hypertension Brother    Diabetes Brother    Diabetes Brother    Hypertension Brother    Diabetes Brother    Breast cancer Maternal Aunt     Social History Social History   Tobacco Use   Smoking status: Never   Smokeless tobacco: Never  Vaping Use   Vaping Use: Never used  Substance Use Topics   Alcohol use: No   Drug use: No    Review of Systems  Review of Systems  Constitutional:  Negative for chills and fever.  HENT:  Negative for sore throat.   Eyes:  Negative for pain.  Respiratory:  Negative for cough and stridor.   Cardiovascular:  Negative for chest pain.  Gastrointestinal:  Negative for vomiting.  Genitourinary:  Negative for dysuria.  Musculoskeletal:  Positive for myalgias (R leg).  Skin:  Negative for rash.  Neurological:  Negative for seizures, loss of consciousness and headaches.  Psychiatric/Behavioral:  Negative for suicidal ideas.   All other systems reviewed and are negative.    ____________________________________________   PHYSICAL EXAM:  VITAL SIGNS: ED Triage Vitals  Enc Vitals Group     BP 11/23/20 1000 140/70     Pulse Rate 11/23/20 1000 64     Resp 11/23/20 1000 20     Temp 11/23/20 1000 98.3 F (36.8 C)     Temp Source 11/23/20 1000 Oral     SpO2 11/23/20 1000 99 %     Weight 11/23/20 1000 202 lb (91.6 kg)     Height 11/23/20 1000 5\' 2"  (1.575 m)     Head Circumference --      Peak Flow --      Pain Score 11/23/20 1007 10     Pain Loc --      Pain Edu? --      Excl. in [place]? --    Vitals:    11/23/20 1000 11/23/20 1352  BP: 140/70 138/72  Pulse: 64 68  Resp: 20 18  Temp: 98.3 F (36.8 C)   SpO2: 99% 99%   Physical Exam Vitals and nursing note reviewed.  Constitutional:      General: She is not in acute distress.    Appearance: She is well-developed.  HENT:     Head: Normocephalic and atraumatic.     Right Ear: External ear normal.  Left Ear: External ear normal.     Nose: Nose normal.     Mouth/Throat:     Mouth: Mucous membranes are moist.  Eyes:     Conjunctiva/sclera: Conjunctivae normal.  Cardiovascular:     Rate and Rhythm: Normal rate and regular rhythm.     Heart sounds: No murmur heard. Pulmonary:     Effort: Pulmonary effort is normal. No respiratory distress.     Breath sounds: Normal breath sounds.  Abdominal:     Palpations: Abdomen is soft.     Tenderness: There is no abdominal tenderness.  Musculoskeletal:     Cervical back: Neck supple.  Skin:    General: Skin is warm and dry.     Capillary Refill: Capillary refill takes less than 2 seconds.  Neurological:     Mental Status: She is alert and oriented to person, place, and time.  Psychiatric:        Mood and Affect: Mood normal.    Patient is intact to light touch throughout both lower extremities.  Patient is very weak and seems as limited by pain on flexion extension of the right knee although she is able to elevate the heel off the bed and wiggle her toes in the right lower extremity on command.  Full strength of the left lower extremity.  The right knee has no erythema, warmth or very large effusion on exam but is mildly tender.  On the posterior aspect.  There are no palpable cords.  There is tenderness is slightly fluctuant. ____________________________________________   LABS (all labs ordered are listed, but only abnormal results are displayed)  Labs Reviewed  BASIC METABOLIC PANEL - Abnormal; Notable for the following components:      Result Value   Glucose, Bld 105 (*)    All  other components within normal limits  CBC WITH DIFFERENTIAL/PLATELET  CK   ____________________________________________  EKG  ____________________________________________  RADIOLOGY  ED MD interpretation: Ultrasound of the right lower extremity shows no evidence of DVT but fluid collection in right posterior calf approximately 6 cm possibly representing hematoma.  X-ray of the right knee shows small effusion without fracture or dislocation.  CTA lower extremities shows some subcutaneous and muscular edema in the medial and posterior right calf somewhat nonspecific.  There is also a small knee effusion and incidentally noted nonobstructing right kidney stone and ventral hernia containing some fat.  No other acute vascular process or clear evidence of hematoma or abscess.  Official radiology report(s): CT ANGIO AO+BIFEM W & OR WO CONTRAST  Result Date: 11/23/2020 CLINICAL DATA:  Evaluate for lower extremity arteriovenous malformation. Right leg pain. EXAM: CT ANGIOGRAPHY OF ABDOMINAL AORTA WITH ILIOFEMORAL RUNOFF TECHNIQUE: Multidetector CT imaging of the abdomen, pelvis and lower extremities was performed using the standard protocol during bolus administration of intravenous contrast. Multiplanar CT image reconstructions and MIPs were obtained to evaluate the vascular anatomy. CONTRAST:  171mL OMNIPAQUE IOHEXOL 350 MG/ML SOLN COMPARISON:  Venous duplex 11/23/2020 and CT 04/08/2018 FINDINGS: VASCULAR Aorta: Atherosclerotic calcifications involving the abdominal aorta without aneurysm, dissection or significant stenosis. Celiac: Patent without evidence of aneurysm, dissection, vasculitis or significant stenosis. Main branches are the left gastric artery and splenic artery. SMA: Patent without evidence of aneurysm, dissection, vasculitis or significant stenosis. Incidentally, common hepatic artery originates from the proximal SMA. Renals: Both renal arteries are patent without evidence of aneurysm,  dissection, vasculitis, fibromuscular dysplasia or significant stenosis. IMA: Patent without evidence of aneurysm, dissection, vasculitis or significant stenosis. RIGHT Lower  Extremity Inflow: Common, internal and external iliac arteries are patent without evidence of aneurysm, dissection, vasculitis or significant stenosis. Outflow: Common, superficial and profunda femoral arteries and the popliteal artery are patent without evidence of aneurysm, dissection, vasculitis or significant stenosis. Runoff: Three-vessel runoff in the proximal calf. Peroneal artery and anterior tibial artery are patent at the ankle. Distal posterior tibial artery is diminutive and limited flow near the ankle. Dorsalis pedis artery is patent. LEFT Lower Extremity Inflow: Common, internal and external iliac arteries are patent without evidence of aneurysm, dissection, vasculitis or significant stenosis. Outflow: Common, superficial and profunda femoral arteries and the popliteal artery are patent without evidence of aneurysm, dissection, vasculitis or significant stenosis. Runoff: Patent three vessel runoff to the ankle. Dorsalis pedis artery is patent. Veins: No obvious venous abnormality within the limitations of this arterial phase study. Review of the MIP images confirms the above findings. NON-VASCULAR Lower chest: Lung bases are clear. Hepatobiliary: Normal appearance of the liver and gallbladder. Pancreas: Unremarkable. No pancreatic ductal dilatation or surrounding inflammatory changes. Spleen: Normal in size without focal abnormality. Adrenals/Urinary Tract: Normal adrenal glands. Normal appearance of left kidney without stones or hydronephrosis. Normal urinary bladder. 9 mm stone in the right kidney lower pole without hydronephrosis. Stomach/Bowel: Stomach is within normal limits. Appendix appears normal. No evidence of bowel wall thickening, distention, or inflammatory changes. Lymphatic: No abdominal or pelvic lymph node  enlargement. Reproductive: Status post hysterectomy. No adnexal masses. Other: Negative for ascites. Complex periumbilical ventral hernias containing fat. Multiple small abdominal wall defects in this area. Largest hernia sac measures 4.2 x 3.1 x 6.0 cm. Musculoskeletal: Small right suprapatellar joint effusion. Synovial enhancement around the knee effusion. Edema involving the medial and posterior calf musculature. Focal superficial subcutaneous edema along the medial aspect of the calf on sequence 10, image 60. Interbody fusion at L4-L5 and L5-S1. Bilateral pedicle screws and rods at L4 and L5. Spurring and arthritic changes at the right knee. IMPRESSION: VASCULAR 1. No evidence for an arteriovenous malformation in the right calf. 2. No evidence for inflow or outflow stenosis. 3. 2 vessel runoff in the right lower extremity. The distal right posterior tibial artery is diminutive as described. 4. Normal three-vessel runoff in left lower extremity. 5. Variant visceral anatomy with a replaced common hepatic artery. NON-VASCULAR 1. Subcutaneous and muscular edema in the medial and posterior right calf. Focal subcutaneous edema in the medial right calf is nonspecific and could be posttraumatic. 2. Small right knee effusion with synovial enhancement. This effusion could be inflammatory in etiology and related to the arthritic changes in the right knee. 3. Nonobstructive right kidney stone. 4. Complex ventral hernias containing fat. Electronically Signed   By: Markus Daft M.D.   On: 11/23/2020 14:10   US Venous Img Lower Unilateral Right  Result Date: 11/23/2020 CLINICAL DATA:  55 year old female with right posterior calf pain. EXAM: RIGHT LOWER EXTREMITY VENOUS DOPPLER ULTRASOUND TECHNIQUE: Gray-scale sonography with graded compression, as well as color Doppler and duplex ultrasound were performed to evaluate the right lower extremity deep venous systems from the level of the common femoral vein and including the  common femoral, femoral, profunda femoral, popliteal and calf veins including the posterior tibial, peroneal and gastrocnemius veins when visible. Spectral Doppler was utilized to evaluate flow at rest and with distal augmentation maneuvers in the common femoral, femoral and popliteal veins. The contralateral common femoral vein was also evaluated for comparison. COMPARISON:  None. FINDINGS: RIGHT LOWER EXTREMITY Common Femoral Vein: No evidence  of thrombus. Normal compressibility, respiratory phasicity and response to augmentation. Central Greater Saphenous Vein: No evidence of thrombus. Normal compressibility and flow on color Doppler imaging. Central Profunda Femoral Vein: No evidence of thrombus. Normal compressibility and flow on color Doppler imaging. Femoral Vein: No evidence of thrombus. Normal compressibility, respiratory phasicity and response to augmentation. Popliteal Vein: No evidence of thrombus. Normal compressibility, respiratory phasicity and response to augmentation. Calf Veins: No evidence of thrombus. Normal compressibility and flow on color Doppler imaging. Other Findings: There is a subcutaneous, ellipsoid shaped heterogeneously hypoechoic fluid collection which measures approximately 6.0 x 3.4 x 1.0 cm without significant internal vascularity. LEFT LOWER EXTREMITY Common Femoral Vein: No evidence of thrombus. Normal compressibility, respiratory phasicity and response to augmentation. IMPRESSION: 1. No evidence of right lower extremity deep venous thrombosis. 2. Complex subcutaneous fluid collection in the right posterior calf measuring up to 6 cm, favored to represent a hematoma. Ruthann Cancer, MD [facility] Electronically Signed   By: Ruthann Cancer M.D.   On: 11/23/2020 11:19   DG Knee Complete 4 Views Right  Result Date: 11/23/2020 CLINICAL DATA:  Right knee pain and swelling, no known injury EXAM: RIGHT KNEE - COMPLETE 4+ VIEW  COMPARISON:  None. FINDINGS: No fracture or dislocation of the right knee. Mild tricompartmental joint space narrowing and osteophytosis. Moderate, nonspecific knee joint effusion. Soft tissues are unremarkable. IMPRESSION: 1. No fracture or dislocation of the right knee. Mild tricompartmental joint space narrowing and osteophytosis. 2.  Moderate, nonspecific knee joint effusion. Electronically Signed   By: Eddie Candle M.D.   On: 11/23/2020 11:43    ____________________________________________   PROCEDURES  Procedure(s) performed (including Critical Care):  Procedures   ____________________________________________   INITIAL IMPRESSION / ASSESSMENT AND PLAN / ED COURSE      Patient presents with above-stated history exam for assessment approximately 1 week of nontraumatic right posterior calf and knee pain.  On arrival she was afebrile and hemodynamically stable.  She has an area that is slightly fluctuant and very tender without overlying skin changes.  She is otherwise neurovascularly intact aside from significant pain on passive and active range of motion of the right knee.  Differential includes DVT, ruptured Baker's cyst, hematoma, abscess and myositis.  Ultrasound of the right lower extremity shows no evidence of DVT but fluid collection in right posterior calf approximately 6 cm possibly representing hematoma.  X-ray of the right knee shows small effusion without fracture or dislocation.  CBC shows no leukocytosis or acute anemia.  BMP shows no significant electrolyte or metabolic derangements.  Case within normal limits and not suggestive of myositis.  Given concern for possible hematoma on ultrasound CTA obtained to assess for any active bleeding possible abscess.   CTA lower extremities shows some subcutaneous and muscular edema in the medial and posterior right calf somewhat nonspecific.  There is also a small knee effusion and incidentally noted nonobstructing right kidney  stone and ventral hernia containing some fat.  No other acute vascular process or clear evidence of hematoma or abscess.  Suspect possible osteoarthritis versus gout or pseudogout.  There is only minimal effusion no significant erythema or constitutional symptoms to suggest a septic joint at this time.  Patient declined crutches or I will give her a short course of Norco.  Advised her to follow-up with her orthopedist tomorrow and she is amenable this.  Strict return precautions discussed including development of any fevers, worsening pain, worsening swelling or any development  of numbness or new weakness.  Discharged stable patient.  Strict return precautions advised and discussed.      ____________________________________________   FINAL CLINICAL IMPRESSION(S) / ED DIAGNOSES  Final diagnoses:  Right leg pain    Medications  ibuprofen (ADVIL) tablet 400 mg (400 mg Oral Given 11/23/20 1049)  oxyCODONE-acetaminophen (PERCOCET/ROXICET) 5-325 MG per tablet 1 tablet (1 tablet Oral Given 11/23/20 1218)  iohexol (OMNIPAQUE) 350 MG/ML injection 125 mL (125 mLs Intravenous Contrast Given 11/23/20 1311)     ED Discharge Orders          Ordered    HYDROcodone-acetaminophen (NORCO) 5-325 MG tablet  Every 6 hours PRN        11/23/20 1358             Note:  This document was prepared using Dragon voice recognition software and [place] include unintentional dictation errors.    Lucrezia Starch, MD 11/23/20 [phone]

## 2020-11-23 NOTE — Discharge Instructions (Signed)
Your CT today showed: IMPRESSION: VASCULAR   1. No evidence for an arteriovenous malformation in the right calf. 2. No evidence for inflow or outflow stenosis. 3. 2 vessel runoff in the right lower extremity. The distal right posterior tibial artery is diminutive as described. 4. Normal three-vessel runoff in left lower extremity. 5. Variant visceral anatomy with a replaced common hepatic artery.   NON-VASCULAR   1. Subcutaneous and muscular edema in the medial and posterior right calf. Focal subcutaneous edema in the medial right calf is nonspecific and could be posttraumatic. 2. Small right knee effusion with synovial enhancement. This effusion could be inflammatory in etiology and related to the arthritic changes in the right knee. 3. Nonobstructive right kidney stone. 4. Complex ventral hernias containing fat.

## 2020-11-23 NOTE — ED Triage Notes (Signed)
Pt reports that she developed right leg pain and swelling behind the back of her calf. She is unable to bear weight or ambulate because of the pain. No known injury. [place] [facility] sent pt over.

## 2020-12-25 ENCOUNTER — Other Ambulatory Visit (INDEPENDENT_AMBULATORY_CARE_PROVIDER_SITE_OTHER): Payer: Self-pay | Admitting: Nurse Practitioner

## 2020-12-25 DIAGNOSIS — M79604 Pain in right leg: Secondary | ICD-10-CM

## 2020-12-28 ENCOUNTER — Ambulatory Visit (INDEPENDENT_AMBULATORY_CARE_PROVIDER_SITE_OTHER): Payer: Medicare HMO

## 2020-12-28 ENCOUNTER — Encounter (INDEPENDENT_AMBULATORY_CARE_PROVIDER_SITE_OTHER): Payer: Self-pay | Admitting: Vascular Surgery

## 2020-12-28 ENCOUNTER — Other Ambulatory Visit: Payer: Self-pay

## 2020-12-28 ENCOUNTER — Ambulatory Visit (INDEPENDENT_AMBULATORY_CARE_PROVIDER_SITE_OTHER): Payer: Medicare HMO | Admitting: Vascular Surgery

## 2020-12-28 VITALS — BP 146/69 | HR 71 | Resp 16 | Wt 196.6 lb

## 2020-12-28 DIAGNOSIS — E119 Type 2 diabetes mellitus without complications: Secondary | ICD-10-CM | POA: Diagnosis not present

## 2020-12-28 DIAGNOSIS — S8011XD Contusion of right lower leg, subsequent encounter: Secondary | ICD-10-CM

## 2020-12-28 DIAGNOSIS — M5126 Other intervertebral disc displacement, lumbar region: Secondary | ICD-10-CM | POA: Diagnosis not present

## 2020-12-28 DIAGNOSIS — M79604 Pain in right leg: Secondary | ICD-10-CM

## 2021-01-04 ENCOUNTER — Encounter (INDEPENDENT_AMBULATORY_CARE_PROVIDER_SITE_OTHER): Payer: Self-pay | Admitting: Vascular Surgery

## 2021-01-04 DIAGNOSIS — E119 Type 2 diabetes mellitus without complications: Secondary | ICD-10-CM | POA: Insufficient documentation

## 2021-01-04 DIAGNOSIS — S8010XA Contusion of unspecified lower leg, initial encounter: Secondary | ICD-10-CM | POA: Insufficient documentation

## 2021-01-04 NOTE — Progress Notes (Signed)
MRN : [id number]  Pamela Torres is a 55 y.o. (1965-08-01) female who presents with chief complaint of check a knot on my right leg.  History of Present Illness:   The patient presents to the office for evaluation of nodule of the right thigh.  She has a remote history of DVT at the time of pregnancy.  Since that episode she has noted persistent varicose veins of the right lower extremity.  Recently she developed a hard nodule in the right thigh which was evaluated with ultrasound and thought to be a 6 cm hematoma.  The initial symptoms were pain and swelling in the lower extremity.  The patient has not been using compression therapy at this point.  No SOB or pleuritic chest pains.  No cough or hemoptysis.  No blood per rectum or blood in any sputum.  No excessive bruising per the patient.  Duplex ultrasound obtained today demonstrates a normal venous system no evidence of superficial reflux the nodule appears consistent with a resolving hematoma and is significantly smaller than the prior study.  Current Meds  Medication Sig   Cholecalciferol (VITAMIN D3 PO) Take 1 capsule by mouth daily.   gabapentin (NEURONTIN) 300 MG capsule Take 300-600 mg by mouth See admin instructions. Take 300 mg by mouth in the morning and afternoon and 600 mg at bedtime   metFORMIN (GLUCOPHAGE) 500 MG tablet Take 500 mg by mouth 2 (two) times daily.   methocarbamol (ROBAXIN) 500 MG tablet Take 1 tablet (500 mg total) by mouth every 6 (six) hours as needed for muscle spasms.   sertraline (ZOLOFT) 50 MG tablet Take 50 mg by mouth daily.    Past Medical History:  Diagnosis Date   Anemia    during pregnancy   Anxiety    Chronic back pain    Cyst in hand    on left hand   Diabetes mellitus without complication ([place])    Type 2   DVT (deep vein thrombosis) in pregnancy    right leg   Gout    Headache    migraines, hasn't had one in "years"   History of kidney stones    PAC (premature atrial contraction)  2018   [place] [facility]    Sciatic pain    Sleep apnea    uses c-pap   Wears glasses     Past Surgical History:  Procedure Laterality Date   ABDOMINAL HYSTERECTOMY     BACK SURGERY  2014   microdiskectomy - lumbar   CARDIAC CATHETERIZATION     01/19/11 [facility]): normal cononary anatomy, EF 64%   CESAREAN SECTION     KNEE SURGERY Right    arthroscopy   LUMBAR LAMINECTOMY/DECOMPRESSION MICRODISCECTOMY Right 12/10/2013   Procedure: Right Lumbar Five to Sacral One Redo Microdiskectomy;  Surgeon: Erline Levine, MD;  Location: [place] [facility];  Service: Neurosurgery;  Laterality: Right;  Right L5-S1 Redo Microdiskectomy   MAXIMUM ACCESS (MAS)POSTERIOR LUMBAR INTERBODY FUSION (PLIF) 1 LEVEL N/A 06/26/2014   Procedure: Lumbar five sacral one Maximum Access Posterior Lumbar Interbody Fusion;  Surgeon: Erline Levine, MD;  Location: [place] [facility];  Service: Neurosurgery;  Laterality: N/A;   ROTATOR CUFF REPAIR Right    TRANSFORAMINAL LUMBAR INTERBODY FUSION (TLIF) WITH PEDICLE SCREW FIXATION 1 LEVEL N/A 07/28/2020   Procedure: Lumbar Four-Five Transforaminal lumbar interbody fusion with exploration of Lumbar Five- Sacral One Fusion;  Surgeon: Erline Levine, MD;  Location: [place];  Service: Neurosurgery;  Laterality: N/A;  Lumbar Four-Five Transforaminal  lumbar interbody fusion with exploration of Lumbar Five- Sacral One Fusion    Social History Social History   Tobacco Use   Smoking status: Never   Smokeless tobacco: Never  Vaping Use   Vaping Use: Never used  Substance Use Topics   Alcohol use: No   Drug use: No    Family History Family History  Problem Relation Age of Onset   Thyroid disease Mother    COPD Mother    Heart attack Father    Hypertension Brother    Diabetes Brother    Diabetes Brother    Hypertension Brother    Diabetes Brother    Breast cancer Maternal Aunt     Allergies  Allergen Reactions   Simvastatin Other (See Comments)    Leg cramps      REVIEW OF SYSTEMS  (Negative unless checked)  Constitutional: [] Weight loss  [] Fever  [] Chills Cardiac: [] Chest pain   [] Chest pressure   [] Palpitations   [] Shortness of breath when laying flat   [] Shortness of breath with exertion. Vascular:  [] Pain in legs with walking   [] Pain in legs at rest  [] History of DVT   [] Phlebitis   [x] Swelling in legs   [] Varicose veins   [] Non-healing ulcers Pulmonary:   [] Uses home oxygen   [] Productive cough   [] Hemoptysis   [] Wheeze  [] COPD   [] Asthma Neurologic:  [] Dizziness   [] Seizures   [] History of stroke   [] History of TIA  [] Aphasia   [] Vissual changes   [] Weakness or numbness in arm   [] Weakness or numbness in leg Musculoskeletal:   [] Joint swelling   [] Joint pain   [] Low back pain Hematologic:  [] Easy bruising  [] Easy bleeding   [] Hypercoagulable state   [] Anemic Gastrointestinal:  [] Diarrhea   [] Vomiting  [] Gastroesophageal reflux/heartburn   [] Difficulty swallowing. Genitourinary:  [] Chronic kidney disease   [] Difficult urination  [] Frequent urination   [] Blood in urine Skin:  [] Rashes   [] Ulcers  Psychological:  [] History of anxiety   []  History of major depression.  Physical Examination  Vitals:   12/28/20 1333  BP: (!) 146/69  Pulse: 71  Resp: 16  Weight: 196 lb 9.6 oz (89.2 kg)   Body mass index is 35.96 kg/m. Gen: WD/WN, NAD Head: [place]/AT, No temporalis wasting.  Ear/Nose/Throat: Hearing grossly intact, nares w/o erythema or drainage, pinna without lesions Eyes: PER, EOMI, sclera nonicteric.  Neck: Supple, no gross masses.  No JVD.  Pulmonary:  Good air movement, no audible wheezing, no use of accessory muscles.  Cardiac: RRR, precordium not hyperdynamic. Vascular: There is a palpable nodule in the right thigh which is nontender not associated with any erythema or induration appears noninfected.  Scattered varicosities present bilaterally.  Mild venous stasis changes to the legs bilaterally.  2+ soft pitting edema  Vessel Right Left  Radial Palpable  Palpable  Gastrointestinal: soft, non-distended. No guarding/no peritoneal signs.  Musculoskeletal: M/S 5/5 throughout.  No deformity.  Neurologic: CN 2-12 intact. Pain and light touch intact in extremities.  Symmetrical.  Speech is fluent. Motor exam as listed above. Psychiatric: Judgment intact, Mood & affect appropriate for pt's clinical situation. Dermatologic: Venous rashes no ulcers noted.  No changes consistent with cellulitis. Lymph : No lichenification or skin changes of chronic lymphedema.  CBC Lab Results  Component Value Date   WBC 8.8 11/23/2020   HGB 12.4 11/23/2020   HCT 37.7 11/23/2020   MCV 84.7 11/23/2020   PLT 274 11/23/2020    BMET  Component Value Date/Time   NA 138 11/23/2020 1048   NA 138 08/27/2013 0933   K 4.0 11/23/2020 1048   K 4.1 08/27/2013 0933   CL 105 11/23/2020 1048   CL 107 08/27/2013 0933   CO2 27 11/23/2020 1048   CO2 26 08/27/2013 0933   GLUCOSE 105 (H) 11/23/2020 1048   GLUCOSE 218 (H) 08/27/2013 0933   BUN 14 11/23/2020 1048   BUN 11 08/27/2013 0933   CREATININE 0.75 11/23/2020 1048   CREATININE 1.01 08/27/2013 0933   CALCIUM 9.0 11/23/2020 1048   CALCIUM 8.8 08/27/2013 0933   GFRNONAA >60 11/23/2020 1048   GFRNONAA >60 08/27/2013 0933   GFRAA >60 04/08/2018 1143   GFRAA >60 08/27/2013 0933   CrCl cannot be calculated (Patient's most recent lab result is older than the maximum 21 days allowed.).  COAG Lab Results  Component Value Date   INR 0.9 03/27/2012    Radiology No results found.   Assessment/Plan 1. Hematoma of right lower extremity, subsequent encounter This appears to be improving.  We will continue observation and I will see her back in 6 to 8 weeks.  2. Herniated lumbar intervertebral disc Continue follow-up with the spine service  3. Type 2 diabetes mellitus without complication, without long-term current use of insulin (HCC) Continue hypoglycemic medications as already ordered, these medications have  been reviewed and there are no changes at this time.  Hgb A1C to be monitored as already arranged by primary service     Hortencia Pilar, MD  01/04/2021 10:38 AM

## 2021-03-10 ENCOUNTER — Other Ambulatory Visit (INDEPENDENT_AMBULATORY_CARE_PROVIDER_SITE_OTHER): Payer: Self-pay | Admitting: Vascular Surgery

## 2021-03-10 DIAGNOSIS — S8011XD Contusion of right lower leg, subsequent encounter: Secondary | ICD-10-CM

## 2021-03-11 ENCOUNTER — Encounter (INDEPENDENT_AMBULATORY_CARE_PROVIDER_SITE_OTHER): Payer: Medicare HMO

## 2021-03-11 ENCOUNTER — Ambulatory Visit (INDEPENDENT_AMBULATORY_CARE_PROVIDER_SITE_OTHER): Payer: Medicare HMO | Admitting: Vascular Surgery

## 2021-03-11 ENCOUNTER — Encounter (INDEPENDENT_AMBULATORY_CARE_PROVIDER_SITE_OTHER): Payer: Self-pay

## 2021-03-16 ENCOUNTER — Telehealth: Payer: Self-pay

## 2021-03-16 NOTE — Telephone Encounter (Signed)
CALLED PATIENT NO ANSWER LEFT VOICEMAIL FOR A CALL BACK ? ?

## 2021-03-16 NOTE — Telephone Encounter (Signed)
Letter sent.

## 2021-04-08 ENCOUNTER — Other Ambulatory Visit (HOSPITAL_BASED_OUTPATIENT_CLINIC_OR_DEPARTMENT_OTHER): Payer: Self-pay

## 2021-04-08 DIAGNOSIS — R454 Irritability and anger: Secondary | ICD-10-CM

## 2021-04-08 DIAGNOSIS — R5383 Other fatigue: Secondary | ICD-10-CM

## 2021-04-08 DIAGNOSIS — R0683 Snoring: Secondary | ICD-10-CM

## 2021-05-24 ENCOUNTER — Encounter (INDEPENDENT_AMBULATORY_CARE_PROVIDER_SITE_OTHER): Payer: Self-pay | Admitting: Nurse Practitioner

## 2021-05-24 ENCOUNTER — Ambulatory Visit (INDEPENDENT_AMBULATORY_CARE_PROVIDER_SITE_OTHER): Payer: Medicare HMO

## 2021-05-24 ENCOUNTER — Other Ambulatory Visit: Payer: Self-pay

## 2021-05-24 ENCOUNTER — Ambulatory Visit (INDEPENDENT_AMBULATORY_CARE_PROVIDER_SITE_OTHER): Payer: Medicare HMO | Admitting: Nurse Practitioner

## 2021-05-24 VITALS — BP 149/66 | HR 65 | Resp 16 | Wt 204.8 lb

## 2021-05-24 DIAGNOSIS — S8011XD Contusion of right lower leg, subsequent encounter: Secondary | ICD-10-CM | POA: Diagnosis not present

## 2021-05-24 DIAGNOSIS — E119 Type 2 diabetes mellitus without complications: Secondary | ICD-10-CM

## 2021-05-24 MED ORDER — TRAMADOL HCL 50 MG PO TABS: 50.0000 mg | ORAL_TABLET | Freq: Four times a day (QID) | ORAL | 0 refills | Status: DC | PRN

## 2021-05-30 ENCOUNTER — Encounter (INDEPENDENT_AMBULATORY_CARE_PROVIDER_SITE_OTHER): Payer: Self-pay | Admitting: Nurse Practitioner

## 2021-05-30 NOTE — Progress Notes (Signed)
? ?Subjective:  ? ? Patient ID: Pamela Torres, female    DOB: Jan 03, 1966, 56 y.o.   MRN: [id number] ?Chief Complaint  ?Patient presents with  ? Follow-up  ?  Ultrasound follow up  ? ? ?The patient returns today for evaluation of a lower extremity hematoma.  Originally there was a knot on the upper thigh the medial leg however today the knot is in the upper calf area.  This area is tender and has been worse over the last couple weeks.  Noninvasive studies today show no evidence of hematoma that is vascularized in nature.  But there is no evidence of DVT but some of the portion of the exam are limited. ? ? ?Review of Systems  ?Cardiovascular:  Positive for leg swelling.  ?All other systems reviewed and are negative. ? ?   ?Objective:  ? Physical Exam ?Vitals reviewed.  ?HENT:  ?   Head: Normocephalic.  ?Cardiovascular:  ?   Rate and Rhythm: Normal rate.  ?   Pulses: Normal pulses.  ?Pulmonary:  ?   Effort: Pulmonary effort is normal.  ?Musculoskeletal:     ?   General: Tenderness present.  ?Skin: ?   General: Skin is warm and dry.  ?Neurological:  ?   Mental Status: She is alert and oriented to person, place, and time.  ?Psychiatric:     ?   Mood and Affect: Mood normal.     ?   Behavior: Behavior normal.     ?   Thought Content: Thought content normal.     ?   Judgment: Judgment normal.  ? ? ?BP (!) 149/66 (BP Location: Right Arm)   Pulse 65   Resp 16   Wt 204 lb 12.8 oz (92.9 kg)   BMI 37.46 kg/m?  ? ?Past Medical History:  ?Diagnosis Date  ? Anemia   ? during pregnancy  ? Anxiety   ? Chronic back pain   ? Cyst in hand   ? on left hand  ? Diabetes mellitus without complication ([place])   ? Type 2  ? DVT (deep vein thrombosis) in pregnancy   ? right leg  ? Gout   ? Headache   ? migraines, hasn't had one in "years"  ? History of kidney stones   ? PAC (premature atrial contraction) 2018  ? [facility] [place] [facility]   ? Sciatic pain   ? Sleep apnea   ? uses c-pap  ? Wears glasses   ? ? ?Social History  ? ?Socioeconomic History  ?  Marital status: Married  ?  Spouse name: Not on file  ? Number of children: Not on file  ? Years of education: Not on file  ? Highest education level: Not on file  ?Occupational History  ? Not on file  ?Tobacco Use  ? Smoking status: Never  ? Smokeless tobacco: Never  ?Vaping Use  ? Vaping Use: Never used  ?Substance and Sexual Activity  ? Alcohol use: No  ? Drug use: No  ? Sexual activity: Not on file  ?Other Topics Concern  ? Not on file  ?Social History Narrative  ? Not on file  ? ?Social Determinants of Health  ? ?Financial Resource Strain: Not on file  ?Food Insecurity: Not on file  ?Transportation Needs: Not on file  ?Physical Activity: Not on file  ?Stress: Not on file  ?Social Connections: Not on file  ?Intimate Partner Violence: Not on file  ? ? ?Past Surgical History:  ?Procedure Laterality Date  ?  ABDOMINAL HYSTERECTOMY    ? BACK SURGERY  2014  ? microdiskectomy - lumbar  ? CARDIAC CATHETERIZATION    ? 01/19/11 [facility]): normal cononary anatomy, EF 64%  ? CESAREAN SECTION    ? KNEE SURGERY Right   ? arthroscopy  ? LUMBAR LAMINECTOMY/DECOMPRESSION MICRODISCECTOMY Right 12/10/2013  ? Procedure: Right Lumbar Five to Sacral One Redo Microdiskectomy;  Surgeon: Erline Levine, MD;  Location: [place] [facility];  Service: Neurosurgery;  Laterality: Right;  Right L5-S1 Redo Microdiskectomy  ? MAXIMUM ACCESS (MAS)POSTERIOR LUMBAR INTERBODY FUSION (PLIF) 1 LEVEL N/A 06/26/2014  ? Procedure: Lumbar five sacral one Maximum Access Posterior Lumbar Interbody Fusion;  Surgeon: Erline Levine, MD;  Location: [place] [facility];  Service: Neurosurgery;  Laterality: N/A;  ? ROTATOR CUFF REPAIR Right   ? TRANSFORAMINAL LUMBAR INTERBODY FUSION (TLIF) WITH PEDICLE SCREW FIXATION 1 LEVEL N/A 07/28/2020  ? Procedure: Lumbar Four-Five Transforaminal lumbar interbody fusion with exploration of Lumbar Five- Sacral One Fusion;  Surgeon: Erline Levine, MD;  Location: [place];  Service: Neurosurgery;  Laterality: N/A;  Lumbar Four-Five Transforaminal  lumbar interbody fusion with exploration of Lumbar Five- Sacral One Fusion  ? ? ?Family History  ?Problem Relation Age of Onset  ? Thyroid disease Mother   ? COPD Mother   ? Heart attack Father   ? Hypertension Brother   ? Diabetes Brother   ? Diabetes Brother   ? Hypertension Brother   ? Diabetes Brother   ? Breast cancer Maternal Aunt   ? ? ?Allergies  ?Allergen Reactions  ? Simvastatin Other (See Comments)  ?  Leg cramps   ? ? ? ?  Latest Ref Rng & Units 11/23/2020  ? 10:48 AM 07/22/2020  ? 10:50 AM 04/08/2018  ? 11:43 AM  ?CBC  ?WBC 4.0 - 10.5 K/uL 8.8   9.1   7.7    ?Hemoglobin 12.0 - 15.0 g/dL 12.4   13.2   12.5    ?Hematocrit 36.0 - 46.0 % 37.7   41.2   39.8    ?Platelets 150 - 400 K/uL 274   284   252    ? ? ? ? ?CMP  ?   ?Component Value Date/Time  ? NA 138 11/23/2020 1048  ? NA 138 08/27/2013 0933  ? K 4.0 11/23/2020 1048  ? K 4.1 08/27/2013 0933  ? CL 105 11/23/2020 1048  ? CL 107 08/27/2013 0933  ? CO2 27 11/23/2020 1048  ? CO2 26 08/27/2013 0933  ? GLUCOSE 105 (H) 11/23/2020 1048  ? GLUCOSE 218 (H) 08/27/2013 0933  ? BUN 14 11/23/2020 1048  ? BUN 11 08/27/2013 0933  ? CREATININE 0.75 11/23/2020 1048  ? CREATININE 1.01 08/27/2013 0933  ? CALCIUM 9.0 11/23/2020 1048  ? CALCIUM 8.8 08/27/2013 0933  ? PROT 7.0 04/08/2018 1143  ? ALBUMIN 3.2 (L) 04/08/2018 1143  ? AST 17 04/08/2018 1143  ? ALT 20 04/08/2018 1143  ? ALKPHOS 54 04/08/2018 1143  ? BILITOT 0.7 04/08/2018 1143  ? GFRNONAA >60 11/23/2020 1048  ? GFRNONAA >60 08/27/2013 0933  ? GFRAA >60 04/08/2018 1143  ? GFRAA >60 08/27/2013 0933  ? ? ? ?No results found. ? ?   ?Assessment & Plan:  ? ?1. Hematoma of right lower extremity, subsequent encounter ?The patient has a hematoma in the lower calf.  The previous hematoma seems to be resolved.  The patient is advised to use cool compresses at first and she can transition to warm compresses.  We will have her return  in 2 weeks to reevaluate.  The patient is also prescribed tramadol due to the pain in the  area. ? ?2. Type 2 diabetes mellitus without complication, without long-term current use of insulin ([place]) ?Continue hypoglycemic medications as already ordered, these medications have been reviewed and there are no changes at this time. ? ?Hgb A1C to be monitored as already arranged by primary service  ? ? ?Current Outpatient Medications on File Prior to Visit  ?Medication Sig Dispense Refill  ? Cholecalciferol (VITAMIN D3 PO) Take 1 capsule by mouth daily.    ? gabapentin (NEURONTIN) 300 MG capsule Take 300-600 mg by mouth See admin instructions. Take 300 mg by mouth in the morning and afternoon and 600 mg at bedtime    ? metFORMIN (GLUCOPHAGE) 500 MG tablet Take 500 mg by mouth 2 (two) times daily.    ? methocarbamol (ROBAXIN) 500 MG tablet Take 1 tablet (500 mg total) by mouth every 6 (six) hours as needed for muscle spasms. 90 tablet 0  ? sertraline (ZOLOFT) 50 MG tablet Take 50 mg by mouth daily.    ? pregabalin (LYRICA) 50 MG capsule 50 mg in morning, 50 mg afternoon, 100 mg qhs (Patient not taking: Reported on 12/28/2020) 120 capsule 1  ? Semaglutide, 1 MG/DOSE, (OZEMPIC, 1 MG/DOSE,) 4 MG/3ML SOPN Inject 1 mg into the skin every Wednesday. (Patient not taking: Reported on 12/28/2020)    ? ?No current facility-administered medications on file prior to visit.  ? ? ?There are no Patient Instructions on file for this visit. ?No follow-ups on file. ? ? ?Kris Hartmann, NP ? ? ?

## 2021-06-14 ENCOUNTER — Other Ambulatory Visit (INDEPENDENT_AMBULATORY_CARE_PROVIDER_SITE_OTHER): Payer: Self-pay | Admitting: Nurse Practitioner

## 2021-06-14 DIAGNOSIS — S8011XD Contusion of right lower leg, subsequent encounter: Secondary | ICD-10-CM

## 2021-06-16 ENCOUNTER — Ambulatory Visit (INDEPENDENT_AMBULATORY_CARE_PROVIDER_SITE_OTHER): Payer: Medicare HMO | Admitting: Nurse Practitioner

## 2021-06-16 ENCOUNTER — Encounter (INDEPENDENT_AMBULATORY_CARE_PROVIDER_SITE_OTHER): Payer: Self-pay | Admitting: Nurse Practitioner

## 2021-06-16 ENCOUNTER — Ambulatory Visit (INDEPENDENT_AMBULATORY_CARE_PROVIDER_SITE_OTHER): Payer: Medicare HMO

## 2021-06-16 VITALS — BP 141/82 | HR 69 | Resp 17 | Ht 63.0 in | Wt 202.8 lb

## 2021-06-16 DIAGNOSIS — E119 Type 2 diabetes mellitus without complications: Secondary | ICD-10-CM | POA: Diagnosis not present

## 2021-06-16 DIAGNOSIS — S8011XD Contusion of right lower leg, subsequent encounter: Secondary | ICD-10-CM

## 2021-06-17 ENCOUNTER — Telehealth (INDEPENDENT_AMBULATORY_CARE_PROVIDER_SITE_OTHER): Payer: Self-pay

## 2021-06-17 ENCOUNTER — Telehealth (INDEPENDENT_AMBULATORY_CARE_PROVIDER_SITE_OTHER): Payer: Self-pay | Admitting: Nurse Practitioner

## 2021-06-17 NOTE — Telephone Encounter (Signed)
Crystal pt's daughter is calling wanting to know her mothers ultrasound results from yesterday's office visit.   ?

## 2021-06-17 NOTE — Telephone Encounter (Signed)
Already sent Arna Medici a message about this same pt and question. ?

## 2021-06-17 NOTE — Telephone Encounter (Signed)
Patient called and wanted to know the results from her ultrasound.  Please advise ?

## 2021-06-27 ENCOUNTER — Encounter (INDEPENDENT_AMBULATORY_CARE_PROVIDER_SITE_OTHER): Payer: Self-pay | Admitting: Nurse Practitioner

## 2021-06-27 NOTE — Progress Notes (Signed)
? ?Subjective:  ? ? Patient ID: Pamela Torres, female    DOB: 11/26/1965, 56 y.o.   MRN: [id number] ?No chief complaint on file. ? ? ?The patient returns today for evaluation of a lower extremity hematoma.  Originally there was a knot on the upper thigh the medial leg however today the knot is in the upper calf area.  This area is tender and has been worse over the last couple weeks.  Noninvasive studies today show no evidence of hematoma that is vascularized in nature however his presentation is not consistent with a hematoma but more so with a superficial thrombophlebitis. ? ? ?Review of Systems  ?Cardiovascular:  Positive for leg swelling.  ?All other systems reviewed and are negative. ? ?   ?Objective:  ? Physical Exam ?Vitals reviewed.  ?HENT:  ?   Head: Normocephalic.  ?Cardiovascular:  ?   Rate and Rhythm: Normal rate.  ?   Pulses: Normal pulses.  ?Pulmonary:  ?   Effort: Pulmonary effort is normal.  ?Skin: ?   General: Skin is warm and dry.  ?Neurological:  ?   Mental Status: She is alert and oriented to person, place, and time.  ?Psychiatric:     ?   Mood and Affect: Mood normal.     ?   Behavior: Behavior normal.     ?   Thought Content: Thought content normal.     ?   Judgment: Judgment normal.  ? ? ?BP (!) 141/82 (BP Location: Right Arm)   Pulse 69   Resp 17   Ht '5\' 3"'$  (1.6 m)   Wt 202 lb 12.8 oz (92 kg)   BMI 35.92 kg/m?  ? ?Past Medical History:  ?Diagnosis Date  ? Anemia   ? during pregnancy  ? Anxiety   ? Chronic back pain   ? Cyst in hand   ? on left hand  ? Diabetes mellitus without complication ([place])   ? Type 2  ? DVT (deep vein thrombosis) in pregnancy   ? right leg  ? Gout   ? Headache   ? migraines, hasn't had one in "years"  ? History of kidney stones   ? PAC (premature atrial contraction) 2018  ? [place] [facility]   ? Sciatic pain   ? Sleep apnea   ? uses c-pap  ? Wears glasses   ? ? ?Social History  ? ?Socioeconomic History  ? Marital status: Married  ?  Spouse name: Not on file  ? Number  of children: Not on file  ? Years of education: Not on file  ? Highest education level: Not on file  ?Occupational History  ? Not on file  ?Tobacco Use  ? Smoking status: Never  ? Smokeless tobacco: Never  ?Vaping Use  ? Vaping Use: Never used  ?Substance and Sexual Activity  ? Alcohol use: No  ? Drug use: No  ? Sexual activity: Not on file  ?Other Topics Concern  ? Not on file  ?Social History Narrative  ? Not on file  ? ?Social Determinants of Health  ? ?Financial Resource Strain: Not on file  ?Food Insecurity: Not on file  ?Transportation Needs: Not on file  ?Physical Activity: Not on file  ?Stress: Not on file  ?Social Connections: Not on file  ?Intimate Partner Violence: Not on file  ? ? ?Past Surgical History:  ?Procedure Laterality Date  ? ABDOMINAL HYSTERECTOMY    ? BACK SURGERY  2014  ? microdiskectomy - lumbar  ?  CARDIAC CATHETERIZATION    ? 01/19/11 [facility]): normal cononary anatomy, EF 64%  ? CESAREAN SECTION    ? KNEE SURGERY Right   ? arthroscopy  ? LUMBAR LAMINECTOMY/DECOMPRESSION MICRODISCECTOMY Right 12/10/2013  ? Procedure: Right Lumbar Five to Sacral One Redo Microdiskectomy;  Surgeon: Erline Levine, MD;  Location: [place] NEUR[facility] ORS;  Service: Neurosurgery;  Laterality: Right;  Right L5-S1 Redo Microdiskectomy  ? MAXIMUM ACCESS (MAS)POSTERIOR LUMBAR INTERBODY FUSION (PLIF) 1 LEVEL N/A 06/26/2014  ? Procedure: Lumbar five sacral one Maximum Access Posterior Lumbar Interbody Fusion;  Surgeon: Erline Levine, MD;  Location: [place] NE[facility];  Service: Neurosurgery;  Laterality: N/A;  ? ROTATOR CUFF REPAIR Right   ? TRANSFORAMINAL LUMBAR INTERBODY FUSION (TLIF) WITH PEDICLE SCREW FIXATION 1 LEVEL N/A 07/28/2020  ? Procedure: Lumbar Four-Five Transforaminal lumbar interbody fusion with exploration of Lumbar Five- Sacral One Fusion;  Surgeon: Erline Levine, MD;  Location: [place];  Service: Neurosurgery;  Laterality: N/A;  Lumbar Four-Five Transforaminal lumbar interbody fusion with exploration of Lumbar Five- Sacral One  Fusion  ? ? ?Family History  ?Problem Relation Age of Onset  ? Thyroid disease Mother   ? COPD Mother   ? Heart attack Father   ? Hypertension Brother   ? Diabetes Brother   ? Diabetes Brother   ? Hypertension Brother   ? Diabetes Brother   ? Breast cancer Maternal Aunt   ? ? ?Allergies  ?Allergen Reactions  ? Simvastatin Other (See Comments)  ?  Leg cramps   ? ? ? ?  Latest Ref Rng & Units 11/23/2020  ? 10:48 AM 07/22/2020  ? 10:50 AM 04/08/2018  ? 11:43 AM  ?CBC  ?WBC 4.0 - 10.5 K/uL 8.8   9.1   7.7    ?Hemoglobin 12.0 - 15.0 g/dL 12.4   13.2   12.5    ?Hematocrit 36.0 - 46.0 % 37.7   41.2   39.8    ?Platelets 150 - 400 K/uL 274   284   252    ? ? ? ? ?CMP  ?   ?Component Value Date/Time  ? NA 138 11/23/2020 1048  ? NA 138 08/27/2013 0933  ? K 4.0 11/23/2020 1048  ? K 4.1 08/27/2013 0933  ? CL 105 11/23/2020 1048  ? CL 107 08/27/2013 0933  ? CO2 27 11/23/2020 1048  ? CO2 26 08/27/2013 0933  ? GLUCOSE 105 (H) 11/23/2020 1048  ? GLUCOSE 218 (H) 08/27/2013 0933  ? BUN 14 11/23/2020 1048  ? BUN 11 08/27/2013 0933  ? CREATININE 0.75 11/23/2020 1048  ? CREATININE 1.01 08/27/2013 0933  ? CALCIUM 9.0 11/23/2020 1048  ? CALCIUM 8.8 08/27/2013 0933  ? PROT 7.0 04/08/2018 1143  ? ALBUMIN 3.2 (L) 04/08/2018 1143  ? AST 17 04/08/2018 1143  ? ALT 20 04/08/2018 1143  ? ALKPHOS 54 04/08/2018 1143  ? BILITOT 0.7 04/08/2018 1143  ? GFRNONAA >60 11/23/2020 1048  ? GFRNONAA >60 08/27/2013 0933  ? GFRAA >60 04/08/2018 1143  ? GFRAA >60 08/27/2013 0933  ? ? ? ?No results found. ? ?   ?Assessment & Plan:  ? ?1. Hematoma of right lower extremity, subsequent encounter ?In reviewing the images as well as consulting with Dr. Delana Meyer, the presentation of the swelling in his lower extremity is unusual for hematoma.  Based upon the presentation and palpation I suspect it is more so a superficial thrombophlebitis.  The patient is advised to continue to utilize warm compresses however we will initiate her on Eliquis  2.5 mg twice daily have her  return in 4 weeks.  No studies ? ?2. Type 2 diabetes mellitus without complication, without long-term current use of insulin ([place]) ?Continue hypoglycemic medications as already ordered, these medications have been reviewed and there are no changes at this time. ? ?Hgb A1C to be monitored as already arranged by primary service  ? ? ?Current Outpatient Medications on File Prior to Visit  ?Medication Sig Dispense Refill  ? Cholecalciferol (VITAMIN D3 PO) Take 1 capsule by mouth daily.    ? gabapentin (NEURONTIN) 300 MG capsule Take 300-600 mg by mouth See admin instructions. Take 300 mg by mouth in the morning and afternoon and 600 mg at bedtime    ? metFORMIN (GLUCOPHAGE) 500 MG tablet Take 500 mg by mouth 2 (two) times daily.    ? methocarbamol (ROBAXIN) 500 MG tablet Take 1 tablet (500 mg total) by mouth every 6 (six) hours as needed for muscle spasms. 90 tablet 0  ? sertraline (ZOLOFT) 50 MG tablet Take 50 mg by mouth daily.    ? traMADol (ULTRAM) 50 MG tablet Take 1 tablet (50 mg total) by mouth every 6 (six) hours as needed. 20 tablet 0  ? ?No current facility-administered medications on file prior to visit.  ? ? ?There are no Patient Instructions on file for this visit. ?No follow-ups on file. ? ? ?Kris Hartmann, NP ? ? ?

## 2021-07-05 ENCOUNTER — Encounter: Payer: Medicare HMO | Admitting: Neurology

## 2021-07-12 ENCOUNTER — Telehealth (INDEPENDENT_AMBULATORY_CARE_PROVIDER_SITE_OTHER): Payer: Self-pay

## 2021-07-13 NOTE — Telephone Encounter (Signed)
Bring her in with a DVT study

## 2021-07-15 ENCOUNTER — Other Ambulatory Visit: Payer: Self-pay

## 2021-07-15 ENCOUNTER — Emergency Department
Admission: EM | Admit: 2021-07-15 | Discharge: 2021-07-15 | Disposition: A | Discharge status: Home / Self Care | Payer: Medicare HMO | Attending: Emergency Medicine | Admitting: Emergency Medicine

## 2021-07-15 ENCOUNTER — Emergency Department: Payer: Medicare HMO

## 2021-07-15 DIAGNOSIS — S8011XA Contusion of right lower leg, initial encounter: Secondary | ICD-10-CM | POA: Diagnosis not present

## 2021-07-15 DIAGNOSIS — X58XXXA Exposure to other specified factors, initial encounter: Secondary | ICD-10-CM | POA: Diagnosis not present

## 2021-07-15 DIAGNOSIS — Z7901 Long term (current) use of anticoagulants: Secondary | ICD-10-CM | POA: Insufficient documentation

## 2021-07-15 DIAGNOSIS — M7989 Other specified soft tissue disorders: Secondary | ICD-10-CM | POA: Diagnosis not present

## 2021-07-15 DIAGNOSIS — S8991XA Unspecified injury of right lower leg, initial encounter: Secondary | ICD-10-CM | POA: Diagnosis present

## 2021-07-15 DIAGNOSIS — Z7982 Long term (current) use of aspirin: Secondary | ICD-10-CM | POA: Insufficient documentation

## 2021-07-15 LAB — COMPREHENSIVE METABOLIC PANEL
ALT: 15 U/L (ref 0–44)
AST: 20 U/L (ref 15–41)
Albumin: 3.8 g/dL (ref 3.5–5.0)
Alkaline Phosphatase: 53 U/L (ref 38–126)
Anion gap: 12 (ref 5–15)
BUN: 9 mg/dL (ref 6–20)
CO2: 23 mmol/L (ref 22–32)
Calcium: 9.2 mg/dL (ref 8.9–10.3)
Chloride: 105 mmol/L (ref 98–111)
Creatinine, Ser: 0.64 mg/dL (ref 0.44–1.00)
GFR, Estimated: >60 mL/min (ref >60)
Glucose, Bld: 114 mg/dL — ABNORMAL HIGH (ref 70–99)
Potassium: 3.8 mmol/L (ref 3.5–5.1)
Sodium: 140 mmol/L (ref 135–145)
Total Bilirubin: 0.5 mg/dL (ref 0.3–1.2)
Total Protein: 7.2 g/dL (ref 6.5–8.1)

## 2021-07-15 LAB — CBC WITH DIFFERENTIAL/PLATELET
Abs Immature Granulocytes: 0.06 10*3/uL (ref 0.00–0.07)
Basophils Absolute: 0 10*3/uL (ref 0.0–0.1)
Basophils Relative: 0 %
Eosinophils Absolute: 0.3 10*3/uL (ref 0.0–0.5)
Eosinophils Relative: 3 %
HCT: 38.9 % (ref 36.0–46.0)
Hemoglobin: 12.4 g/dL (ref 12.0–15.0)
Immature Granulocytes: 1 %
Lymphocytes Relative: 37 %
Lymphs Abs: 3.4 10*3/uL (ref 0.7–4.0)
MCH: 27.6 pg (ref 26.0–34.0)
MCHC: 31.9 g/dL (ref 30.0–36.0)
MCV: 86.4 fL (ref 80.0–100.0)
Monocytes Absolute: 0.6 10*3/uL (ref 0.1–1.0)
Monocytes Relative: 7 %
Neutro Abs: 4.7 10*3/uL (ref 1.7–7.7)
Neutrophils Relative %: 52 %
Platelets: 263 10*3/uL (ref 150–400)
RBC: 4.5 MIL/uL (ref 3.87–5.11)
RDW: 13.4 % (ref 11.5–15.5)
WBC: 9 10*3/uL (ref 4.0–10.5)
nRBC: 0 % (ref 0.0–0.2)

## 2021-07-15 MED ORDER — PREDNISONE 50 MG PO TABS: 50.0000 mg | ORAL_TABLET | Freq: Every day | ORAL | 0 refills | Status: DC

## 2021-07-15 NOTE — ED Provider Notes (Signed)
[facility] Provider Note  Patient Contact: 5:18 PM (approximate)   History   Abscess   HPI  Pamela Torres is a 56 y.o. female who presents the emergency department for evaluation of a lesion to the right medial calf.  Patient states that she has had this area present for greater than a month.  Has seen her vascular surgeon who felt that this was likely thrombophlebitis.  She was started on aspirin and low-dose Eliquis.  She states that is been relatively stable until last couple of days in which case the lesion started to enlarge and become more painful.     Physical Exam   Triage Vital Signs: ED Triage Vitals [07/15/21 1654]  Enc Vitals Group     BP (!) 155/95     Pulse Rate 68     Resp 17     Temp 98.1 F (36.7 C)     Temp Source Oral     SpO2 96 %     Weight      Height      Head Circumference      Peak Flow      Pain Score 7     Pain Loc      Pain Edu?      Excl. in [place]?     Most recent vital signs: Vitals:   07/15/21 1654  BP: (!) 155/95  Pulse: 68  Resp: 17  Temp: 98.1 F (36.7 C)  SpO2: 96%     General: Alert and in no acute distress. Cardiovascular:  Good peripheral perfusion Respiratory: Normal respiratory effort without tachypnea or retractions. Lungs CTAB.  Musculoskeletal: Full range of motion to all extremities.  Visualization of the right calf reveals an edematous slightly ecchymotic region to the proximal calf region.  This is fluctuant and tender to palpation.  There is no warmth.  There is no palpable cords or other lesions.  No tenderness extending into the popliteal region.  Dorsalis pedis pulses sensation intact distally. Neurologic:  No gross focal neurologic deficits are appreciated.  Skin:   No rash noted Other:   ED Results / Procedures / Treatments   Labs (all labs ordered are listed, but only abnormal results are displayed) Labs Reviewed  COMPREHENSIVE METABOLIC PANEL - Abnormal; Notable for the  following components:      Result Value   Glucose, Bld 114 (*)    All other components within normal limits  CBC WITH DIFFERENTIAL/PLATELET     EKG     RADIOLOGY  I personally viewed, evaluated, and interpreted these images as part of my medical decision making, as well as reviewing the written report by the radiologist.  ED Provider Interpretation: There is a large complex fluid collection likely hematoma  K[place] RT LOWER EXTREM LTD SOFT TISSUE NON VASCULAR  Result Date: 07/15/2021 CLINICAL DATA:  Painful swelling of the right calf. EXAM: ULTRASOUND right LOWER EXTREMITY LIMITED TECHNIQUE: Ultrasound examination of the lower extremity soft tissues was performed in the area of clinical concern. COMPARISON:  CT scan 11/23/2020 FINDINGS: There is a large complex subcutaneous fluid collection involving the right inner mid to upper calf measuring approximately 7.0 x 2.2 x 4.8 cm. This is most likely a hematoma. Recommend clinical correlation. IMPRESSION: Large complex subcutaneous fluid collection most likely a hematoma. Recommend correlation with clinical findings. Electronically Signed   By: Marijo Sanes M.D.   On: 07/15/2021 18:52    PROCEDURES:  Critical Care performed: No  Procedures  MEDICATIONS ORDERED IN ED: Medications - No data to display   IMPRESSION / MDM / [place] / ED COURSE  I reviewed the triage vital signs and the nursing notes.                              Differential diagnosis includes, but is not limited to, hematoma, cellulitis, abscess, thrombophlebitis, DVT   Patient's diagnosis is consistent with hematoma.  Patient presents the emergency department for an enlarging lesion to the right calf.  She is seen her vascular surgeon prior for this complaint.  At that time it appeared that this was thrombophlebitis and the patient was started on aspirin and low-dose Eliquis.  Patient had been stable for couple weeks but started to enlarge become more  painful.  Exam was more concerning for area of hematoma.  Ultrasound confirms fluid-filled lesion likely hematoma to the calf region.  There is surrounding vasculature.  This patient is already been on anticoagulation and this is enlarging I would recommend following up with vascular surgery to determine how to best manage this enlarging hematoma.. Patient is given ED precautions to return to the ED for any worsening or new symptoms.        FINAL CLINICAL IMPRESSION(S) / ED DIAGNOSES   Final diagnoses:  Hematoma of right lower leg     Rx / DC Orders   ED Discharge Orders          Ordered    predniSONE (DELTASONE) 50 MG tablet  Daily with breakfast        07/15/21 2029             Note:  This document was prepared using Dragon voice recognition software and Felician[place] include unintentional dictation errors.   Brynda Peon 07/15/21 2052    Vanessa Smoot, MD 07/16/21 [phone]

## 2021-07-15 NOTE — ED Triage Notes (Addendum)
Pt presents to ED with c/o of abscess to R inner knee area that has been ongoing for the past 3-4 months. Pt states PCP cannot see her and UC also could not see her. Pt states she was put on eliquis "for hematoma". Pt states she had a Korea of R leg to rule out DVT. Pt states a vascular MD RX'ed blood thinner.   Pt does have a swollen area under the skin with what appears to also be a abscess as well.   Pt denies fevers or chills.

## 2021-07-15 NOTE — ED Notes (Signed)
Pt discharge information reviewed. Pt understands need for follow up care and when to return if symptoms worsen. All questions answered. Pt is alert and oriented with even and regular respirations. Pt is seen ambulating out of department with string steady gait.   

## 2021-07-19 ENCOUNTER — Ambulatory Visit (INDEPENDENT_AMBULATORY_CARE_PROVIDER_SITE_OTHER): Payer: Medicare HMO | Admitting: Nurse Practitioner

## 2021-07-19 DIAGNOSIS — S8011XD Contusion of right lower leg, subsequent encounter: Secondary | ICD-10-CM | POA: Diagnosis not present

## 2021-07-21 ENCOUNTER — Encounter (INDEPENDENT_AMBULATORY_CARE_PROVIDER_SITE_OTHER): Payer: Medicare HMO

## 2021-07-21 ENCOUNTER — Ambulatory Visit (INDEPENDENT_AMBULATORY_CARE_PROVIDER_SITE_OTHER): Payer: Medicare HMO | Admitting: Nurse Practitioner

## 2021-07-22 ENCOUNTER — Telehealth (INDEPENDENT_AMBULATORY_CARE_PROVIDER_SITE_OTHER): Payer: Self-pay

## 2021-07-22 NOTE — Telephone Encounter (Signed)
Spoke with the patient and she is scheduled with Dr. Lucky Cowboy on 07/29/21 for a incision and drainage at the [facility]. Pre-op phone call is on 07/27/21 between 1-5 pm. Pre-surgical instructions were discussed and will be mailed.

## 2021-07-23 ENCOUNTER — Other Ambulatory Visit: Payer: Medicare HMO

## 2021-07-26 ENCOUNTER — Encounter (INDEPENDENT_AMBULATORY_CARE_PROVIDER_SITE_OTHER): Payer: Self-pay | Admitting: Nurse Practitioner

## 2021-07-26 NOTE — H&P (View-Only) (Signed)
Subjective:    Patient ID: Pamela Torres, female    DOB: [birth year], 56 y.o.   MRN: [id number] No chief complaint on file.   The patient returns today for follow-up evaluation of a swelling on her right calf area.  The patient has had notable evaluations that believe this area to be a hematoma.  The patient has tried warm compresses as well as low-dose anticoagulation and nothing has helped the wound to subside.  The wound is very painful and very tender for the patient.  There are some areas where the skin has become slightly thin.  She notes that this pain is significant on a daily basis for her.  These areas are also noted to be vascularized.   Review of Systems  Cardiovascular:  Positive for leg swelling.  All other systems reviewed and are negative.     Objective:   Physical Exam Vitals reviewed.  HENT:     Head: Normocephalic.  Cardiovascular:     Rate and Rhythm: Normal rate.     Pulses: Normal pulses.  Pulmonary:     Effort: Pulmonary effort is normal.  Skin:    General: Skin is warm and dry.     Findings: Lesion present.  Neurological:     Mental Status: She is alert and oriented to person, place, and time.  Psychiatric:        Mood and Affect: Mood normal.        Behavior: Behavior normal.        Thought Content: Thought content normal.        Judgment: Judgment normal.    There were no vitals taken for this visit.  Past Medical History:  Diagnosis Date   Anemia during pregnancy    Anxiety    Chronic back pain    Cyst in hand    on left hand   DVT (deep vein thrombosis) in pregnancy    right leg   Gout    History of kidney stones    Hx of migraines    OSA on CPAP    PAC (premature atrial contraction) 2018   Sciatic pain    T2DM (type 2 diabetes mellitus) ([place])    Wears glasses     Social History   Socioeconomic History   Marital status: Married    Spouse name: Not on file   Number of children: Not on file   Years of education: Not on file    Highest education level: Not on file  Occupational History   Not on file  Tobacco Use   Smoking status: Never   Smokeless tobacco: Never  Vaping Use   Vaping Use: Never used  Substance and Sexual Activity   Alcohol use: No   Drug use: No   Sexual activity: Not on file  Other Topics Concern   Not on file  Social History Narrative   Not on file   Social Determinants of Health   Financial Resource Strain: Not on file  Food Insecurity: Not on file  Transportation Needs: Not on file  Physical Activity: Not on file  Stress: Not on file  Social Connections: Not on file  Intimate Partner Violence: Not on file    Past Surgical History:  Procedure Laterality Date   ABDOMINAL HYSTERECTOMY     BACK SURGERY  2014   microdiskectomy - lumbar   CARDIAC CATHETERIZATION     01/19/11 [facility]): normal cononary anatomy, EF 64%   CESAREAN SECTION  KNEE SURGERY Right    arthroscopy   LUMBAR LAMINECTOMY/DECOMPRESSION MICRODISCECTOMY Right 12/10/2013   Procedure: Right Lumbar Five to Sacral One Redo Microdiskectomy;  Surgeon: Erline Levine, MD;  Location: [place] NEURO ORS;  Service: Neurosurgery;  Laterality: Right;  Right L5-S1 Redo Microdiskectomy   MAXIMUM ACCESS (MAS)POSTERIOR LUMBAR INTERBODY FUSION (PLIF) 1 LEVEL N/A 06/26/2014   Procedure: Lumbar five sacral one Maximum Access Posterior Lumbar Interbody Fusion;  Surgeon: Erline Levine, MD;  Location: [place] NEURO ORS;  Service: Neurosurgery;  Laterality: N/A;   ROTATOR CUFF REPAIR Right    TRANSFORAMINAL LUMBAR INTERBODY FUSION (TLIF) WITH PEDICLE SCREW FIXATION 1 LEVEL N/A 07/28/2020   Procedure: Lumbar Four-Five Transforaminal lumbar interbody fusion with exploration of Lumbar Five- Sacral One Fusion;  Surgeon: Erline Levine, MD;  Location: [place];  Service: Neurosurgery;  Laterality: N/A;  Lumbar Four-Five Transforaminal lumbar interbody fusion with exploration of Lumbar Five- Sacral One Fusion    Family History  Problem Relation Age of Onset    Thyroid disease Mother    COPD Mother    Heart attack Father    Hypertension Brother    Diabetes Brother    Diabetes Brother    Hypertension Brother    Diabetes Brother    Breast cancer Maternal Aunt     Allergies  Allergen Reactions   Simvastatin Other (See Comments)    Leg cramps        Latest Ref Rng & Units 07/15/2021    4:57 PM 11/23/2020   10:48 AM 07/22/2020   10:50 AM  CBC  WBC 4.0 - 10.5 K/uL 9.0   8.8   9.1    Hemoglobin 12.0 - 15.0 g/dL 12.4   12.4   13.2    Hematocrit 36.0 - 46.0 % 38.9   37.7   41.2    Platelets 150 - 400 K/uL 263   274   284        CMP     Component Value Date/Time   NA 140 07/15/2021 1657   NA 138 08/27/2013 0933   K 3.8 07/15/2021 1657   K 4.1 08/27/2013 0933   CL 105 07/15/2021 1657   CL 107 08/27/2013 0933   CO2 23 07/15/2021 1657   CO2 26 08/27/2013 0933   GLUCOSE 114 (H) 07/15/2021 1657   GLUCOSE 218 (H) 08/27/2013 0933   BUN 9 07/15/2021 1657   BUN 11 08/27/2013 0933   CREATININE 0.64 07/15/2021 1657   CREATININE 1.01 08/27/2013 0933   CALCIUM 9.2 07/15/2021 1657   CALCIUM 8.8 08/27/2013 0933   PROT 7.2 07/15/2021 1657   ALBUMIN 3.8 07/15/2021 1657   AST 20 07/15/2021 1657   ALT 15 07/15/2021 1657   ALKPHOS 53 07/15/2021 1657   BILITOT 0.5 07/15/2021 1657   GFRNONAA >60 07/15/2021 1657   GFRNONAA >60 08/27/2013 0933   GFRAA >60 04/08/2018 1143   GFRAA >60 08/27/2013 0933     No results found.     Assessment & Plan:   1. Hematoma of right lower extremity, subsequent encounter Based upon the significant pain that the patient is having it would be in her best interest to excise this area.  I discussed with the patient that this will require a wound VAC as the cavity will likely refill with a seroma, therefore to prevent subsequent seroma formation as well as to help with wound healing wound VAC will be necessary.  This will likely take several weeks to recover from.  Following this discussion as well as  the risk,  benefits and alternatives the patient is agreeable to proceed.   Current Outpatient Medications on File Prior to Visit  Medication Sig Dispense Refill   Cholecalciferol (VITAMIN D3 PO) Take 1 capsule by mouth daily.     gabapentin (NEURONTIN) 300 MG capsule Take 300-600 mg by mouth See admin instructions. Take 300 mg by mouth in the morning and afternoon and 600 mg at bedtime     metFORMIN (GLUCOPHAGE) 500 MG tablet Take 500 mg by mouth 2 (two) times daily.     methocarbamol (ROBAXIN) 500 MG tablet Take 1 tablet (500 mg total) by mouth every 6 (six) hours as needed for muscle spasms. 90 tablet 0   predniSONE (DELTASONE) 50 MG tablet Take 1 tablet (50 mg total) by mouth daily with breakfast. 5 tablet 0   sertraline (ZOLOFT) 50 MG tablet Take 50 mg by mouth daily.     traMADol (ULTRAM) 50 MG tablet Take 1 tablet (50 mg total) by mouth every 6 (six) hours as needed. 20 tablet 0   No current facility-administered medications on file prior to visit.    There are no Patient Instructions on file for this visit. No follow-ups on file.   Kris Hartmann, NP

## 2021-07-26 NOTE — Progress Notes (Signed)
Subjective:    Patient ID: Laurian Brim Bovee, female    DOB: 10/29/1965, 56 y.o.   MRN: [id number] No chief complaint on file.   The patient returns today for follow-up evaluation of a swelling on her right calf area.  The patient has had notable evaluations that believe this area to be a hematoma.  The patient has tried warm compresses as well as low-dose anticoagulation and nothing has helped the wound to subside.  The wound is very painful and very tender for the patient.  There are some areas where the skin has become slightly thin.  She notes that this pain is significant on a daily basis for her.  These areas are also noted to be vascularized.   Review of Systems  Cardiovascular:  Positive for leg swelling.  All other systems reviewed and are negative.     Objective:   Physical Exam Vitals reviewed.  HENT:     Head: Normocephalic.  Cardiovascular:     Rate and Rhythm: Normal rate.     Pulses: Normal pulses.  Pulmonary:     Effort: Pulmonary effort is normal.  Skin:    General: Skin is warm and dry.     Findings: Lesion present.  Neurological:     Mental Status: She is alert and oriented to person, place, and time.  Psychiatric:        Mood and Affect: Mood normal.        Behavior: Behavior normal.        Thought Content: Thought content normal.        Judgment: Judgment normal.    There were no vitals taken for this visit.  Past Medical History:  Diagnosis Date   Anemia during pregnancy    Anxiety    Chronic back pain    Cyst in hand    on left hand   DVT (deep vein thrombosis) in pregnancy    right leg   Gout    History of kidney stones    Hx of migraines    OSA on CPAP    PAC (premature atrial contraction) 2018   Sciatic pain    T2DM (type 2 diabetes mellitus) ([place])    Wears glasses     Social History   Socioeconomic History   Marital status: Married    Spouse name: Not on file   Number of children: Not on file   Years of education: Not on file    Highest education level: Not on file  Occupational History   Not on file  Tobacco Use   Smoking status: Never   Smokeless tobacco: Never  Vaping Use   Vaping Use: Never used  Substance and Sexual Activity   Alcohol use: No   Drug use: No   Sexual activity: Not on file  Other Topics Concern   Not on file  Social History Narrative   Not on file   Social Determinants of Health   Financial Resource Strain: Not on file  Food Insecurity: Not on file  Transportation Needs: Not on file  Physical Activity: Not on file  Stress: Not on file  Social Connections: Not on file  Intimate Partner Violence: Not on file    Past Surgical History:  Procedure Laterality Date   ABDOMINAL HYSTERECTOMY     BACK SURGERY  2014   microdiskectomy - lumbar   CARDIAC CATHETERIZATION     01/19/11 [facility]): normal cononary anatomy, EF 64%   CESAREAN SECTION  KNEE SURGERY Right    arthroscopy   LUMBAR LAMINECTOMY/DECOMPRESSION MICRODISCECTOMY Right 12/10/2013   Procedure: Right Lumbar Five to Sacral One Redo Microdiskectomy;  Surgeon: Erline Levine, MD;  Location: [place] NEURO ORS;  Service: Neurosurgery;  Laterality: Right;  Right L5-S1 Redo Microdiskectomy   MAXIMUM ACCESS (MAS)POSTERIOR LUMBAR INTERBODY FUSION (PLIF) 1 LEVEL N/A 06/26/2014   Procedure: Lumbar five sacral one Maximum Access Posterior Lumbar Interbody Fusion;  Surgeon: Erline Levine, MD;  Location: [place] NEURO ORS;  Service: Neurosurgery;  Laterality: N/A;   ROTATOR CUFF REPAIR Right    TRANSFORAMINAL LUMBAR INTERBODY FUSION (TLIF) WITH PEDICLE SCREW FIXATION 1 LEVEL N/A 07/28/2020   Procedure: Lumbar Four-Five Transforaminal lumbar interbody fusion with exploration of Lumbar Five- Sacral One Fusion;  Surgeon: Erline Levine, MD;  Location: [place];  Service: Neurosurgery;  Laterality: N/A;  Lumbar Four-Five Transforaminal lumbar interbody fusion with exploration of Lumbar Five- Sacral One Fusion    Family History  Problem Relation Age of Onset    Thyroid disease Mother    COPD Mother    Heart attack Father    Hypertension Brother    Diabetes Brother    Diabetes Brother    Hypertension Brother    Diabetes Brother    Breast cancer Maternal Aunt     Allergies  Allergen Reactions   Simvastatin Other (See Comments)    Leg cramps        Latest Ref Rng & Units 07/15/2021    4:57 PM 11/23/2020   10:48 AM 07/22/2020   10:50 AM  CBC  WBC 4.0 - 10.5 K/uL 9.0   8.8   9.1    Hemoglobin 12.0 - 15.0 g/dL 12.4   12.4   13.2    Hematocrit 36.0 - 46.0 % 38.9   37.7   41.2    Platelets 150 - 400 K/uL 263   274   284        CMP     Component Value Date/Time   NA 140 07/15/2021 1657   NA 138 08/27/2013 0933   K 3.8 07/15/2021 1657   K 4.1 08/27/2013 0933   CL 105 07/15/2021 1657   CL 107 08/27/2013 0933   CO2 23 07/15/2021 1657   CO2 26 08/27/2013 0933   GLUCOSE 114 (H) 07/15/2021 1657   GLUCOSE 218 (H) 08/27/2013 0933   BUN 9 07/15/2021 1657   BUN 11 08/27/2013 0933   CREATININE 0.64 07/15/2021 1657   CREATININE 1.01 08/27/2013 0933   CALCIUM 9.2 07/15/2021 1657   CALCIUM 8.8 08/27/2013 0933   PROT 7.2 07/15/2021 1657   ALBUMIN 3.8 07/15/2021 1657   AST 20 07/15/2021 1657   ALT 15 07/15/2021 1657   ALKPHOS 53 07/15/2021 1657   BILITOT 0.5 07/15/2021 1657   GFRNONAA >60 07/15/2021 1657   GFRNONAA >60 08/27/2013 0933   GFRAA >60 04/08/2018 1143   GFRAA >60 08/27/2013 0933     No results found.     Assessment & Plan:   1. Hematoma of right lower extremity, subsequent encounter Based upon the significant pain that the patient is having it would be in her best interest to excise this area.  I discussed with the patient that this will require a wound VAC as the cavity will likely refill with a seroma, therefore to prevent subsequent seroma formation as well as to help with wound healing wound VAC will be necessary.  This will likely take several weeks to recover from.  Following this discussion as well as  the risk,  benefits and alternatives the patient is agreeable to proceed.   Current Outpatient Medications on File Prior to Visit  Medication Sig Dispense Refill   Cholecalciferol (VITAMIN D3 PO) Take 1 capsule by mouth daily.     gabapentin (NEURONTIN) 300 MG capsule Take 300-600 mg by mouth See admin instructions. Take 300 mg by mouth in the morning and afternoon and 600 mg at bedtime     metFORMIN (GLUCOPHAGE) 500 MG tablet Take 500 mg by mouth 2 (two) times daily.     methocarbamol (ROBAXIN) 500 MG tablet Take 1 tablet (500 mg total) by mouth every 6 (six) hours as needed for muscle spasms. 90 tablet 0   predniSONE (DELTASONE) 50 MG tablet Take 1 tablet (50 mg total) by mouth daily with breakfast. 5 tablet 0   sertraline (ZOLOFT) 50 MG tablet Take 50 mg by mouth daily.     traMADol (ULTRAM) 50 MG tablet Take 1 tablet (50 mg total) by mouth every 6 (six) hours as needed. 20 tablet 0   No current facility-administered medications on file prior to visit.    There are no Patient Instructions on file for this visit. No follow-ups on file.   Kris Hartmann, NP

## 2021-07-27 ENCOUNTER — Encounter
Admission: RE | Admit: 2021-07-27 | Discharge: 2021-07-27 | Disposition: A | Discharge status: Home / Self Care | Payer: Medicare HMO | Source: Ambulatory Visit | Attending: Vascular Surgery | Admitting: Vascular Surgery

## 2021-07-27 ENCOUNTER — Other Ambulatory Visit: Payer: Self-pay

## 2021-07-27 ENCOUNTER — Other Ambulatory Visit (INDEPENDENT_AMBULATORY_CARE_PROVIDER_SITE_OTHER): Payer: Self-pay | Admitting: Nurse Practitioner

## 2021-07-27 VITALS — Ht 63.0 in | Wt 220.0 lb

## 2021-07-27 DIAGNOSIS — E119 Type 2 diabetes mellitus without complications: Secondary | ICD-10-CM

## 2021-07-27 DIAGNOSIS — S8011XD Contusion of right lower leg, subsequent encounter: Secondary | ICD-10-CM

## 2021-07-27 HISTORY — DX: Personal history of other diseases of the nervous system and sense organs: Z86.69

## 2021-07-27 HISTORY — DX: Anemia complicating pregnancy, unspecified trimester: O99.019

## 2021-07-27 HISTORY — DX: Obstructive sleep apnea (adult) (pediatric): G47.33

## 2021-07-27 HISTORY — DX: Obstructive sleep apnea (adult) (pediatric): Z99.89

## 2021-07-27 HISTORY — DX: Type 2 diabetes mellitus without complications: E11.9

## 2021-07-27 NOTE — Patient Instructions (Addendum)
Your procedure is scheduled on: 07/29/2021  Report to the Registration Desk on the 1st floor of the [facility]. To find out your arrival time, please call [phone] between 1PM - 3PM on: 07/28/2021  If your arrival time is 6:00 am, do not arrive prior to that time as the [facility] entrance doors do not open until 6:00 am.  REMEMBER: Instructions that are not followed completely [place] result in serious medical risk, up to and including death; or upon the discretion of your surgeon and anesthesiologist your surgery [place] need to be rescheduled.  Do not eat food after midnight the night before surgery.  No gum chewing, lozengers or hard candies.  You [place] however, drink water up to 2 hours before you are scheduled to arrive for your surgery. Do not drink anything within 2 hours of your scheduled arrival time.   TAKE THESE MEDICATIONS THE MORNING OF SURGERY WITH A SIP OF WATER: Gabapentin Zoloft Tramadol    Follow recommendations from Cardiologist, Pulmonologist or PCP regarding stopping Aspirin.  Do not take metformin two days prior to surgery. Do not take it on 5/30, 5/31 and 6/1/ (actual surgery day)  One week prior to surgery: Stop Anti-inflammatories (NSAIDS) such as Advil, Aleve, Ibuprofen, Motrin, Naproxen, Naprosyn and Aspirin based products such as Excedrin, Goodys Powder, BC Powder. Stop ANY OVER THE COUNTER supplements until after surgery. You [place] however, continue to take Tylenol if needed for pain up until the day of surgery.  On the morning of surgery brush your teeth with toothpaste and water, you [place] rinse your mouth with mouthwash if you wish. Do not swallow any toothpaste or mouthwash.  Use CHG Soap  as directed on instruction sheet.  Do not wear jewelry, make-up, hairpins, clips or nail polish.  Do not wear lotions, powders, or perfumes.   Do not shave body from the neck down 48 hours prior to surgery just in case you cut yourself which could leave a site for  infection.  Also, freshly shaved skin Glad become irritated if using the CHG soap.  Contact lenses, hearing aids and dentures [place] not be worn into surgery.  Do not bring valuables to the hospital. [facility] is not responsible for any missing/lost belongings or valuables.    Notify your doctor if there is any change in your medical condition (cold, fever, infection).  Wear comfortable clothing (specific to your surgery type) to the hospital.  After surgery, you can help prevent lung complications by doing breathing exercises.  Take deep breaths and cough every 1-2 hours. Your doctor [place] order a device called an Incentive Spirometer to help you take deep breaths.   If you are being admitted to the hospital overnight, leave your suitcase in the car. After surgery it [place] be brought to your room.  If you are being discharged the day of surgery, you will not be allowed to drive home. You will need a responsible adult (18 years or older) to drive you home and stay with you that night.   If you are taking public transportation, you will need to have a responsible adult (18 years or older) with you. Please confirm with your physician that it is acceptable to use public transportation.   Please call the [facility] Dept. at [phone] if you have any questions about these instructions.  Surgery Visitation Policy:  Patients undergoing a surgery or procedure [place] have two family members or support persons with them as long as the person is  not COVID-19 positive or experiencing its symptoms.

## 2021-07-28 ENCOUNTER — Encounter
Admission: RE | Admit: 2021-07-28 | Discharge: 2021-07-28 | Disposition: A | Discharge status: Home / Self Care | Payer: Medicare HMO | Source: Ambulatory Visit | Attending: Vascular Surgery | Admitting: Vascular Surgery

## 2021-07-28 DIAGNOSIS — Z01818 Encounter for other preprocedural examination: Secondary | ICD-10-CM | POA: Diagnosis present

## 2021-07-28 DIAGNOSIS — X58XXXA Exposure to other specified factors, initial encounter: Secondary | ICD-10-CM | POA: Diagnosis not present

## 2021-07-28 DIAGNOSIS — S8011XA Contusion of right lower leg, initial encounter: Secondary | ICD-10-CM | POA: Insufficient documentation

## 2021-07-28 DIAGNOSIS — S8011XD Contusion of right lower leg, subsequent encounter: Secondary | ICD-10-CM

## 2021-07-28 LAB — TYPE AND SCREEN
ABO/RH(D): B POS
Antibody Screen: NEGATIVE

## 2021-07-29 ENCOUNTER — Ambulatory Visit: Payer: Medicare HMO | Admitting: Urgent Care

## 2021-07-29 ENCOUNTER — Other Ambulatory Visit: Payer: Self-pay

## 2021-07-29 ENCOUNTER — Encounter: Payer: Self-pay | Admitting: Vascular Surgery

## 2021-07-29 ENCOUNTER — Encounter
Admission: RE | Disposition: A | Discharge status: Home / Self Care | Payer: Self-pay | Source: Home / Self Care | Attending: Vascular Surgery

## 2021-07-29 ENCOUNTER — Ambulatory Visit
Admission: RE | Admit: 2021-07-29 | Discharge: 2021-07-29 | Disposition: A | Discharge status: Home / Self Care | Payer: Medicare HMO | Attending: Vascular Surgery | Admitting: Vascular Surgery

## 2021-07-29 DIAGNOSIS — E119 Type 2 diabetes mellitus without complications: Secondary | ICD-10-CM | POA: Insufficient documentation

## 2021-07-29 DIAGNOSIS — F419 Anxiety disorder, unspecified: Secondary | ICD-10-CM | POA: Diagnosis not present

## 2021-07-29 DIAGNOSIS — G8929 Other chronic pain: Secondary | ICD-10-CM | POA: Insufficient documentation

## 2021-07-29 DIAGNOSIS — Z6838 Body mass index (BMI) 38.0-38.9, adult: Secondary | ICD-10-CM | POA: Diagnosis not present

## 2021-07-29 DIAGNOSIS — Z79899 Other long term (current) drug therapy: Secondary | ICD-10-CM | POA: Insufficient documentation

## 2021-07-29 DIAGNOSIS — Z79890 Hormone replacement therapy (postmenopausal): Secondary | ICD-10-CM | POA: Diagnosis not present

## 2021-07-29 DIAGNOSIS — E669 Obesity, unspecified: Secondary | ICD-10-CM | POA: Diagnosis not present

## 2021-07-29 DIAGNOSIS — R2241 Localized swelling, mass and lump, right lower limb: Secondary | ICD-10-CM | POA: Diagnosis present

## 2021-07-29 DIAGNOSIS — G4733 Obstructive sleep apnea (adult) (pediatric): Secondary | ICD-10-CM | POA: Insufficient documentation

## 2021-07-29 DIAGNOSIS — M109 Gout, unspecified: Secondary | ICD-10-CM | POA: Diagnosis not present

## 2021-07-29 DIAGNOSIS — M549 Dorsalgia, unspecified: Secondary | ICD-10-CM | POA: Diagnosis not present

## 2021-07-29 DIAGNOSIS — Z86718 Personal history of other venous thrombosis and embolism: Secondary | ICD-10-CM | POA: Diagnosis not present

## 2021-07-29 DIAGNOSIS — Z7984 Long term (current) use of oral hypoglycemic drugs: Secondary | ICD-10-CM | POA: Diagnosis not present

## 2021-07-29 DIAGNOSIS — C4921 Malignant neoplasm of connective and soft tissue of right lower limb, including hip: Secondary | ICD-10-CM

## 2021-07-29 DIAGNOSIS — C44792 Other specified malignant neoplasm of skin of right lower limb, including hip: Secondary | ICD-10-CM | POA: Diagnosis not present

## 2021-07-29 HISTORY — PX: APPLICATION OF WOUND VAC: SHX5189

## 2021-07-29 HISTORY — PX: INCISION AND DRAINAGE ABSCESS: SHX5864

## 2021-07-29 LAB — GLUCOSE, CAPILLARY
Glucose-Capillary: 154 mg/dL — ABNORMAL HIGH (ref 70–99)
Glucose-Capillary: 98 mg/dL (ref 70–99)
Glucose-Capillary: 129 mg/dL — ABNORMAL HIGH (ref 70–99)

## 2021-07-29 SURGERY — INCISION AND DRAINAGE, ABSCESS
Anesthesia: General | Site: Leg Lower | Laterality: Right

## 2021-07-29 MED ORDER — OXYCODONE HCL 5 MG PO TABS
ORAL_TABLET | ORAL | Status: AC
  Filled 2021-07-29: qty 1

## 2021-07-29 MED ORDER — SODIUM CHLORIDE 0.9 % IV SOLN: INTRAVENOUS | Status: DC

## 2021-07-29 MED ORDER — OXYCODONE HCL 5 MG PO TABS
5.0000 mg | ORAL_TABLET | Freq: Once | ORAL | Status: AC
  Administered 2021-07-29: 5 mg via ORAL

## 2021-07-29 MED ORDER — 0.9 % SODIUM CHLORIDE (POUR BTL) OPTIME
TOPICAL | Status: DC | PRN
  Administered 2021-07-29: 1000 mL

## 2021-07-29 MED ORDER — OXYCODONE HCL 5 MG PO TABS: 5.0000 mg | ORAL_TABLET | Freq: Once | ORAL | Status: AC | PRN

## 2021-07-29 MED ORDER — PROPOFOL 10 MG/ML IV BOLUS
INTRAVENOUS | Status: DC | PRN
  Administered 2021-07-29: 180 mg via INTRAVENOUS

## 2021-07-29 MED ORDER — ORAL CARE MOUTH RINSE: 15.0000 mL | Freq: Once | OROMUCOSAL | Status: AC

## 2021-07-29 MED ORDER — FENTANYL CITRATE (PF) 100 MCG/2ML IJ SOLN
25.0000 ug | INTRAMUSCULAR | Status: DC | PRN
  Administered 2021-07-29: 50 ug via INTRAVENOUS
  Administered 2021-07-29: 25 ug via INTRAVENOUS

## 2021-07-29 MED ORDER — FAMOTIDINE 20 MG PO TABS
ORAL_TABLET | ORAL | Status: AC
  Administered 2021-07-29: 20 mg via ORAL
  Filled 2021-07-29: qty 1

## 2021-07-29 MED ORDER — CEFAZOLIN SODIUM-DEXTROSE 2-4 GM/100ML-% IV SOLN
INTRAVENOUS | Status: AC
  Filled 2021-07-29: qty 100

## 2021-07-29 MED ORDER — DEXAMETHASONE SODIUM PHOSPHATE 10 MG/ML IJ SOLN
INTRAMUSCULAR | Status: DC | PRN
  Administered 2021-07-29: 8 mg via INTRAVENOUS

## 2021-07-29 MED ORDER — MIDAZOLAM HCL 2 MG/2ML IJ SOLN
INTRAMUSCULAR | Status: AC
  Filled 2021-07-29: qty 2

## 2021-07-29 MED ORDER — CEFAZOLIN SODIUM-DEXTROSE 2-4 GM/100ML-% IV SOLN
2.0000 g | INTRAVENOUS | Status: AC
  Administered 2021-07-29: 2 g via INTRAVENOUS

## 2021-07-29 MED ORDER — DEXMEDETOMIDINE (PRECEDEX) IN NS 20 MCG/5ML (4 MCG/ML) IV SYRINGE
PREFILLED_SYRINGE | INTRAVENOUS | Status: DC | PRN
  Administered 2021-07-29: 8 ug via INTRAVENOUS
  Administered 2021-07-29: 12 ug via INTRAVENOUS

## 2021-07-29 MED ORDER — HYDROMORPHONE HCL 1 MG/ML IJ SOLN: 1.0000 mg | Freq: Once | INTRAMUSCULAR | Status: DC | PRN

## 2021-07-29 MED ORDER — ONDANSETRON HCL 4 MG/2ML IJ SOLN: 4.0000 mg | Freq: Once | INTRAMUSCULAR | Status: DC | PRN

## 2021-07-29 MED ORDER — FENTANYL CITRATE (PF) 100 MCG/2ML IJ SOLN
INTRAMUSCULAR | Status: AC
  Filled 2021-07-29: qty 2

## 2021-07-29 MED ORDER — ACETAMINOPHEN 10 MG/ML IV SOLN
1000.0000 mg | Freq: Once | INTRAVENOUS | Status: DC | PRN
  Administered 2021-07-29: 1000 mg via INTRAVENOUS

## 2021-07-29 MED ORDER — FAMOTIDINE 20 MG PO TABS: 20.0000 mg | ORAL_TABLET | Freq: Once | ORAL | Status: AC

## 2021-07-29 MED ORDER — FENTANYL CITRATE (PF) 100 MCG/2ML IJ SOLN
INTRAMUSCULAR | Status: DC | PRN
Start: 2021-07-29 — End: 2021-07-29
  Administered 2021-07-29 (×2): 50 ug via INTRAVENOUS

## 2021-07-29 MED ORDER — GLYCOPYRROLATE 0.2 MG/ML IJ SOLN
INTRAMUSCULAR | Status: DC | PRN
  Administered 2021-07-29: .2 mg via INTRAVENOUS

## 2021-07-29 MED ORDER — TRAMADOL HCL 50 MG PO TABS: 50.0000 mg | ORAL_TABLET | Freq: Once | ORAL | Status: AC

## 2021-07-29 MED ORDER — OXYCODONE HCL 5 MG PO TABS
ORAL_TABLET | ORAL | Status: AC
  Administered 2021-07-29: 5 mg via ORAL
  Filled 2021-07-29: qty 1

## 2021-07-29 MED ORDER — CHLORHEXIDINE GLUCONATE CLOTH 2 % EX PADS
6.0000 | MEDICATED_PAD | Freq: Once | CUTANEOUS | Status: AC
  Administered 2021-07-29: 6 via TOPICAL

## 2021-07-29 MED ORDER — LIDOCAINE HCL (CARDIAC) PF 100 MG/5ML IV SOSY
PREFILLED_SYRINGE | INTRAVENOUS | Status: DC | PRN
  Administered 2021-07-29: 100 mg via INTRAVENOUS

## 2021-07-29 MED ORDER — ACETAMINOPHEN 10 MG/ML IV SOLN
INTRAVENOUS | Status: AC
  Filled 2021-07-29: qty 100

## 2021-07-29 MED ORDER — CHLORHEXIDINE GLUCONATE 0.12 % MT SOLN: 15.0000 mL | Freq: Once | OROMUCOSAL | Status: AC

## 2021-07-29 MED ORDER — CHLORHEXIDINE GLUCONATE 0.12 % MT SOLN
OROMUCOSAL | Status: AC
  Administered 2021-07-29: 15 mL via OROMUCOSAL
  Filled 2021-07-29: qty 15

## 2021-07-29 MED ORDER — TRAMADOL HCL 50 MG PO TABS
ORAL_TABLET | ORAL | Status: AC
  Administered 2021-07-29: 50 mg via ORAL
  Filled 2021-07-29: qty 1

## 2021-07-29 MED ORDER — ONDANSETRON HCL 4 MG/2ML IJ SOLN
INTRAMUSCULAR | Status: DC | PRN
  Administered 2021-07-29: 4 mg via INTRAVENOUS

## 2021-07-29 MED ORDER — OXYCODONE HCL 5 MG/5ML PO SOLN: 5.0000 mg | Freq: Once | ORAL | Status: AC | PRN

## 2021-07-29 MED ORDER — TRAMADOL HCL 50 MG PO TABS: 50.0000 mg | ORAL_TABLET | Freq: Four times a day (QID) | ORAL | 0 refills | Status: DC | PRN

## 2021-07-29 MED ORDER — LACTATED RINGERS IV SOLN
INTRAVENOUS | Status: DC
Start: 2021-07-29 — End: 2021-07-29

## 2021-07-29 MED ORDER — FENTANYL CITRATE (PF) 100 MCG/2ML IJ SOLN
INTRAMUSCULAR | Status: AC
  Administered 2021-07-29: 50 ug via INTRAVENOUS
  Filled 2021-07-29: qty 2

## 2021-07-29 MED ORDER — ONDANSETRON HCL 4 MG/2ML IJ SOLN: 4.0000 mg | Freq: Four times a day (QID) | INTRAMUSCULAR | Status: DC | PRN

## 2021-07-29 MED ORDER — EPHEDRINE SULFATE (PRESSORS) 50 MG/ML IJ SOLN
INTRAMUSCULAR | Status: DC | PRN
  Administered 2021-07-29 (×2): 5 mg via INTRAVENOUS

## 2021-07-29 SURGICAL SUPPLY — 35 items
APL PRP STRL LF DISP 70% ISPRP (MISCELLANEOUS) ×2
CANISTER WOUND CARE 500ML ATS (WOUND CARE) ×3 IMPLANT
CHLORAPREP W/TINT 26 (MISCELLANEOUS) ×3 IMPLANT
DRAPE INCISE IOBAN 66X45 STRL (DRAPES) ×3 IMPLANT
DRAPE LAPAROTOMY 100X77 ABD (DRAPES) ×3 IMPLANT
DRSG VAC ATS LRG SENSATRAC (GAUZE/BANDAGES/DRESSINGS) ×3 IMPLANT
DRSG VAC ATS MED SENSATRAC (GAUZE/BANDAGES/DRESSINGS) ×3 IMPLANT
ELECT CAUTERY BLADE 6.4 (BLADE) ×3 IMPLANT
ELECT REM PT RETURN 9FT ADLT (ELECTROSURGICAL) ×3
ELECTRODE REM PT RTRN 9FT ADLT (ELECTROSURGICAL) ×2 IMPLANT
GAUZE 4X4 16PLY ~~LOC~~+RFID DBL (SPONGE) ×3 IMPLANT
GLOVE BIO SURGEON STRL SZ7 (GLOVE) ×3 IMPLANT
GOWN STRL REUS W/ TWL LRG LVL3 (GOWN DISPOSABLE) ×4 IMPLANT
GOWN STRL REUS W/ TWL XL LVL3 (GOWN DISPOSABLE) ×2 IMPLANT
GOWN STRL REUS W/TWL LRG LVL3 (GOWN DISPOSABLE) ×6
GOWN STRL REUS W/TWL XL LVL3 (GOWN DISPOSABLE) ×3
KIT TURNOVER KIT A (KITS) ×3 IMPLANT
LABEL OR SOLS (LABEL) ×3 IMPLANT
MANIFOLD NEPTUNE II (INSTRUMENTS) ×3 IMPLANT
NS IRRIG 1000ML POUR BTL (IV SOLUTION) ×3 IMPLANT
PACK BASIN MAJOR ARMC (MISCELLANEOUS) ×3 IMPLANT
PACK BASIN MINOR ARMC (MISCELLANEOUS) ×3 IMPLANT
PAD ABD DERMACEA PRESS 5X9 (GAUZE/BANDAGES/DRESSINGS) ×3 IMPLANT
SOL PREP PVP 2OZ (MISCELLANEOUS) ×3
SOLUTION PREP PVP 2OZ (MISCELLANEOUS) ×2 IMPLANT
SPONGE T-LAP 18X18 ~~LOC~~+RFID (SPONGE) ×6 IMPLANT
SUT ETHILON 3-0 (SUTURE) ×1 IMPLANT
SUT PDS PLUS 0 (SUTURE)
SUT PDS PLUS AB 0 CT-2 (SUTURE) IMPLANT
SUT SILK 3 0 (SUTURE) ×3
SUT SILK 3 0 TIES 10X30 (SUTURE) ×1 IMPLANT
SUT SILK 3-0 18XBRD TIE 12 (SUTURE) IMPLANT
SUT VIC AB 3-0 SH 27 (SUTURE) ×3
SUT VIC AB 3-0 SH 27X BRD (SUTURE) IMPLANT
WATER STERILE IRR 500ML POUR (IV SOLUTION) ×3 IMPLANT

## 2021-07-29 NOTE — Op Note (Signed)
[facility]   OPERATIVE NOTE  DATE: 07/29/2021  PRE-OPERATIVE DIAGNOSIS: Painful mass right calf  POST-OPERATIVE DIAGNOSIS: same as above  PROCEDURE: 1.   Excision of mass right calf, VAC dressing placement  SURGEON: Leotis Pain  ASSISTANT(S): None  ANESTHESIA: general  ESTIMATED BLOOD LOSS: 15 cc  FINDING(S): 1.  Firm lobulated mass in the right calf that was resected and sent to pathology   INDICATIONS:   Pamela Torres is a 56 y.o. female who presents with a painful mass in her right calf.  She is starting to have skin breakdown and this needs to be resected.  Risks and benefits were discussed and informed consent was obtained.  DESCRIPTION: After obtaining full informed written consent, the patient was brought back to the operating room and placed supine upon the operating table.  The patient was prepped and draped in the standard fashion.  An elliptical incision was made around the right calf mass.  This included the area of skin impending breakdown with in the ellipsed area.  I dissected down and there was a firm indurated lobulated mass that we were able to easily dissected out.  It was not adherent to nearby structures or particularly concerning for a malignancy or other process, but it was not clear what the cause was.  This was resected and marked for orientation with a long suture at the inferior aspect of the wound and the short suture at the superior aspect of the wound.  The area was dried up and several venous branches were ligated and divided with silk ties.  The wound was then closed with a series of interrupted 3-0 Vicryl sutures and then interrupted 3-0 nylon sutures for the skin leaving slight gaps to allow drainage and evacuation of fluid and will develop in the cavity.  A negative pressure dressing was then placed over the wound with a VAC sponge cut to fit the wound and strips to obtain an occlusive seal over the sponge and then it was connected  to suction. At this point, we elected to complete the procedure.  The patient was taken to the recovery room in stable condition.   COMPLICATIONS: None  CONDITION: Stable  Leotis Pain  07/29/2021, 1:19 PM    This note was created with Dragon Medical transcription system. Any errors in dictation are purely unintentional.

## 2021-07-29 NOTE — Discharge Instructions (Signed)
AMBULATORY SURGERY  ?DISCHARGE INSTRUCTIONS ? ? ?The drugs that you were given will stay in your system until tomorrow so for the next 24 hours you should not: ? ?Drive an automobile ?Make any legal decisions ?Drink any alcoholic beverage ? ? ?You Scheper resume regular meals tomorrow.  Today it is better to start with liquids and gradually work up to solid foods. ? ?You All eat anything you prefer, but it is better to start with liquids, then soup and crackers, and gradually work up to solid foods. ? ? ?Please notify your doctor immediately if you have any unusual bleeding, trouble breathing, redness and pain at the surgery site, drainage, fever, or pain not relieved by medication. ? ? ? ?Additional Instructions: ? ? ? ?Please contact your physician with any problems or Same Day Surgery at [phone], Monday through Friday 6 am to 4 pm, or [facility] Main number at [phone].  ?

## 2021-07-29 NOTE — Anesthesia Preprocedure Evaluation (Addendum)
Anesthesia Evaluation  Patient identified by MRN, date of birth, ID band Patient awake    Reviewed: Allergy & Precautions, NPO status , Patient's Chart, lab work & pertinent test results  History of Anesthesia Complications Negative for: history of anesthetic complications  Airway Mallampati: I   Neck ROM: Full    Dental no notable dental hx.    Pulmonary sleep apnea ,    Pulmonary exam normal breath sounds clear to auscultation       Cardiovascular Normal cardiovascular exam Rhythm:Regular Rate:Normal  Hx DVT while pregnant, not on anticoagulation  ECG 07/28/21:  Normal sinus rhythm Rightward axis ST & T wave abnormality, consider anterior ischemia Prolonged QT Abnormal ECG When compared with ECG of 25-Paxton-2022 10:42, No significant change was found   Neuro/Psych PSYCHIATRIC DISORDERS Anxiety Chronic back pain    GI/Hepatic negative GI ROS,   Endo/Other  diabetes, Type 2Obesity   Renal/GU Renal disease (nephrolithiasis)     Musculoskeletal Gout    Abdominal   Peds  Hematology negative hematology ROS (+)   Anesthesia Other Findings   Reproductive/Obstetrics                            Anesthesia Physical Anesthesia Plan  ASA: 2  Anesthesia Plan: General   Post-op Pain Management:    Induction: Intravenous  PONV Risk Score and Plan: 3 and Ondansetron, Dexamethasone and Treatment Ramus vary due to age or medical condition  Airway Management Planned: LMA  Additional Equipment:   Intra-op Plan:   Post-operative Plan: Extubation in OR  Informed Consent: I have reviewed the patients History and Physical, chart, labs and discussed the procedure including the risks, benefits and alternatives for the proposed anesthesia with the patient or authorized representative who has indicated his/her understanding and acceptance.     Dental advisory given  Plan Discussed with:  CRNA  Anesthesia Plan Comments: (Patient consented for risks of anesthesia including but not limited to:  - adverse reactions to medications - damage to eyes, teeth, lips or other oral mucosa - nerve damage due to positioning  - sore throat or hoarseness - damage to heart, brain, nerves, lungs, other parts of body or loss of life  Informed patient about role of CRNA in peri- and intra-operative care.  Patient voiced understanding.)        Anesthesia Quick Evaluation

## 2021-07-29 NOTE — Anesthesia Postprocedure Evaluation (Signed)
Anesthesia Post Note  Patient: Pamela Torres  Procedure(s) Performed: INCISION AND DRAINAGE ABSCESS (Right: Leg Lower) APPLICATION OF WOUND VAC (Right)  Patient location during evaluation: PACU Anesthesia Type: General Level of consciousness: awake and alert, oriented and patient cooperative Pain management: pain level controlled Vital Signs Assessment: post-procedure vital signs reviewed and stable Respiratory status: spontaneous breathing, nonlabored ventilation and respiratory function stable Cardiovascular status: blood pressure returned to baseline and stable Postop Assessment: adequate PO intake Anesthetic complications: no   No notable events documented.   Last Vitals:  Vitals:   07/29/21 1330 07/29/21 1345  BP: (!) 142/69 116/76  Pulse: 84 79  Resp: (!) 9 11  Temp:  36.8 C  SpO2: 93% 94%    Last Pain:  Vitals:   07/29/21 1330  TempSrc:   PainSc: [place]

## 2021-07-29 NOTE — Interval H&P Note (Signed)
History and Physical Interval Note:  07/29/2021 11:27 AM  Pamela Torres  has presented today for surgery, with the diagnosis of HEMATOMA OF RIGHT LEG.  The various methods of treatment have been discussed with the patient and family. After consideration of risks, benefits and other options for treatment, the patient has consented to  Procedure(s): INCISION AND DRAINAGE ABSCESS (Right) APPLICATION OF WOUND VAC (Right) as a surgical intervention.  The patient's history has been reviewed, patient examined, no change in status, stable for surgery.  I have reviewed the patient's chart and labs.  Questions were answered to the patient's satisfaction.     Leotis Pain

## 2021-07-29 NOTE — Anesthesia Procedure Notes (Signed)
Procedure Name: Intubation Date/Time: 07/29/2021 12:27 PM Performed by: Lerry Liner, CRNA Pre-anesthesia Checklist: Patient identified, Emergency Drugs available, Suction available and Patient being monitored Patient Re-evaluated:Patient Re-evaluated prior to induction Oxygen Delivery Method: Circle system utilized Preoxygenation: Pre-oxygenation with 100% oxygen Induction Type: IV induction Ventilation: Mask ventilation without difficulty LMA: LMA inserted LMA Size: 4.0 Number of attempts: 1 Airway Equipment and Method: Stylet and Oral airway Placement Confirmation: positive ETCO2 and breath sounds checked- equal and bilateral Tube secured with: Tape Dental Injury: Teeth and Oropharynx as per pre-operative assessment

## 2021-07-29 NOTE — Transfer of Care (Signed)
Immediate Anesthesia Transfer of Care Note  Patient: Pamela Torres  Procedure(s) Performed: INCISION AND DRAINAGE ABSCESS (Right: Leg Lower) APPLICATION OF WOUND VAC (Right)  Patient Location: PACU  Anesthesia Type:General  Level of Consciousness: drowsy  Airway & Oxygen Therapy: Patient Spontanous Breathing and Patient connected to face mask oxygen  Post-op Assessment: Report given to RN  Post vital signs: stable  Last Vitals:  Vitals Value Taken Time  BP 158/76 07/29/21 1311  Temp    Pulse 94 07/29/21 1313  Resp 14 07/29/21 1313  SpO2 99 % 07/29/21 1313  Vitals shown include unvalidated device data.  Last Pain:  Vitals:   07/29/21 1134  TempSrc: Temporal  PainSc: 6          Complications: No notable events documented.

## 2021-07-30 ENCOUNTER — Encounter: Payer: Self-pay | Admitting: Vascular Surgery

## 2021-08-05 LAB — SURGICAL PATHOLOGY

## 2021-08-06 ENCOUNTER — Ambulatory Visit (INDEPENDENT_AMBULATORY_CARE_PROVIDER_SITE_OTHER): Payer: Medicare HMO | Admitting: Nurse Practitioner

## 2021-08-06 ENCOUNTER — Encounter (INDEPENDENT_AMBULATORY_CARE_PROVIDER_SITE_OTHER): Payer: Self-pay | Admitting: Nurse Practitioner

## 2021-08-06 VITALS — BP 154/80 | HR 83 | Resp 16 | Wt 204.0 lb

## 2021-08-06 DIAGNOSIS — C4499 Other specified malignant neoplasm of skin, unspecified: Secondary | ICD-10-CM

## 2021-08-06 NOTE — Progress Notes (Signed)
Subjective:    Patient ID: Pamela Torres, female    DOB: [birth year], 56 y.o.   MRN: [id number] Chief Complaint  Patient presents with   Follow-up    [facility] 1 week wound check    Pamela Torres is a 56 year old female that presents today for follow-up evaluation of recent resection of hematoma.  The patient had an area which was very tender and painful prior to resection.  The patient had multiple noninvasive studies which indicated it was a vascularized hematoma.  However, it was noted that during the time of resection, it was a mass and not a hematoma.  This mass was sent for pathology reports.  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  The patient denies any drainage from the area.  The wound itself is well approximated with no area of redness.    Review of Systems  Skin:  Positive for wound.  All other systems reviewed and are negative.      Objective:   Physical Exam Vitals reviewed.  HENT:     Head: Normocephalic.  Cardiovascular:     Rate and Rhythm: Normal rate.     Pulses: Normal pulses.  Pulmonary:     Effort: Pulmonary effort is normal.  Skin:    General: Skin is warm and dry.  Neurological:     Mental Status: She is alert and oriented to person, place, and time.  Psychiatric:        Mood and Affect: Mood normal.        Behavior: Behavior normal.        Thought Content: Thought content normal.        Judgment: Judgment normal.     BP (!) 154/80 (BP Location: Right Arm)   Pulse 83   Resp 16   Wt 204 lb (92.5 kg)   BMI 36.14 kg/m   Past Medical History:  Diagnosis Date   Anemia during pregnancy    Anxiety    Chronic back pain    Cyst in hand    on left hand   DVT (deep vein thrombosis) in pregnancy    right leg   Gout    History of kidney stones    Hx of migraines    OSA on CPAP    PAC (premature atrial contraction) 2018   Sciatic pain    T2DM (type 2 diabetes mellitus) ([place])    Wears glasses      Social History   Socioeconomic History   Marital status: Married    Spouse name: Not on file   Number of children: Not on file   Years of education: Not on file   Highest education level: Not on file  Occupational History   Not on file  Tobacco Use   Smoking status: Never   Smokeless tobacco: Never  Vaping Use   Vaping Use: Never used  Substance and Sexual Activity   Alcohol use: No   Drug use: No   Sexual activity: Not on file  Other Topics Concern   Not on file  Social History Narrative   Lives at home with husband.    Social Determinants of Health   Financial Resource Strain: Not on file  Food Insecurity: Not on file  Transportation Needs: Not on file  Physical Activity: Not on file  Stress: Not on file  Social Connections: Not on file  Intimate Partner Violence: Not on file    Past Surgical History:  Procedure Laterality  Date   ABDOMINAL HYSTERECTOMY     APPLICATION OF WOUND VAC Right 07/29/2021   Procedure: APPLICATION OF WOUND VAC;  Surgeon: Algernon Huxley, MD;  Location: [facility] ORS;  Service: Vascular;  Laterality: Right;   BACK SURGERY  2014   microdiskectomy - lumbar   CARDIAC CATHETERIZATION     01/19/11 [facility]): normal cononary anatomy, EF 64%   CESAREAN SECTION     INCISION AND DRAINAGE ABSCESS Right 07/29/2021   Procedure: INCISION AND DRAINAGE ABSCESS;  Surgeon: Algernon Huxley, MD;  Location: [facility] ORS;  Service: Vascular;  Laterality: Right;   KNEE SURGERY Right    arthroscopy   LUMBAR LAMINECTOMY/DECOMPRESSION MICRODISCECTOMY Right 12/10/2013   Procedure: Right Lumbar Five to Sacral One Redo Microdiskectomy;  Surgeon: Erline Levine, MD;  Location: [place] NEURO ORS;  Service: Neurosurgery;  Laterality: Right;  Right L5-S1 Redo Microdiskectomy   MAXIMUM ACCESS (MAS)POSTERIOR LUMBAR INTERBODY FUSION (PLIF) 1 LEVEL N/A 06/26/2014   Procedure: Lumbar five sacral one Maximum Access Posterior Lumbar Interbody Fusion;  Surgeon: Erline Levine, MD;  Location: [place] NEURO  ORS;  Service: Neurosurgery;  Laterality: N/A;   ROTATOR CUFF REPAIR Right    TRANSFORAMINAL LUMBAR INTERBODY FUSION (TLIF) WITH PEDICLE SCREW FIXATION 1 LEVEL N/A 07/28/2020   Procedure: Lumbar Four-Five Transforaminal lumbar interbody fusion with exploration of Lumbar Five- Sacral One Fusion;  Surgeon: Erline Levine, MD;  Location: [place];  Service: Neurosurgery;  Laterality: N/A;  Lumbar Four-Five Transforaminal lumbar interbody fusion with exploration of Lumbar Five- Sacral One Fusion    Family History  Problem Relation Age of Onset   Thyroid disease Mother    COPD Mother    Heart attack Father    Hypertension Brother    Diabetes Brother    Diabetes Brother    Hypertension Brother    Diabetes Brother    Breast cancer Maternal Aunt     Allergies  Allergen Reactions   Simvastatin Other (See Comments)    Leg cramps        Latest Ref Rng & Units 07/15/2021    4:57 PM 11/23/2020   10:48 AM 07/22/2020   10:50 AM  CBC  WBC 4.0 - 10.5 K/uL 9.0  8.8  9.1   Hemoglobin 12.0 - 15.0 g/dL 12.4  12.4  13.2   Hematocrit 36.0 - 46.0 % 38.9  37.7  41.2   Platelets 150 - 400 K/uL 263  274  284       CMP     Component Value Date/Time   NA 140 07/15/2021 1657   NA 138 08/27/2013 0933   K 3.8 07/15/2021 1657   K 4.1 08/27/2013 0933   CL 105 07/15/2021 1657   CL 107 08/27/2013 0933   CO2 23 07/15/2021 1657   CO2 26 08/27/2013 0933   GLUCOSE 114 (H) 07/15/2021 1657   GLUCOSE 218 (H) 08/27/2013 0933   BUN 9 07/15/2021 1657   BUN 11 08/27/2013 0933   CREATININE 0.64 07/15/2021 1657   CREATININE 1.01 08/27/2013 0933   CALCIUM 9.2 07/15/2021 1657   CALCIUM 8.8 08/27/2013 0933   PROT 7.2 07/15/2021 1657   ALBUMIN 3.8 07/15/2021 1657   AST 20 07/15/2021 1657   ALT 15 07/15/2021 1657   ALKPHOS 53 07/15/2021 1657   BILITOT 0.5 07/15/2021 1657   GFRNONAA >60 07/15/2021 1657   GFRNONAA >60 08/27/2013 0933   GFRAA >60 04/08/2018 1143   GFRAA >60 08/27/2013 0933     No results  found.  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  We will refer the patient to hematology oncology for further evaluation.  The patient continues to have some tenderness at the area but she is tolerating the pain well with the tramadol.  Sutures removed today and tolerated well.  Steri-Strips applied.  Patient will return in 4 weeks to reevaluate wound. - Ambulatory referral to Hematology / Oncology   Current Outpatient Medications on File Prior to Visit  Medication Sig Dispense Refill   Cholecalciferol (VITAMIN D3 PO) Take 1 capsule by mouth daily.     gabapentin (NEURONTIN) 300 MG capsule Take 300-600 mg by mouth See admin instructions. Take 300 mg by mouth in the morning and afternoon and 600 mg at bedtime     metFORMIN (GLUCOPHAGE) 500 MG tablet Take 500 mg by mouth 2 (two) times daily.     sertraline (ZOLOFT) 50 MG tablet Take 50 mg by mouth daily.     traMADol (ULTRAM) 50 MG tablet Take 1 tablet (50 mg total) by mouth every 6 (six) hours as needed. 20 tablet 0   methocarbamol (ROBAXIN) 500 MG tablet Take 1 tablet (500 mg total) by mouth every 6 (six) hours as needed for muscle spasms. (Patient not taking: Reported on 07/27/2021) 90 tablet 0   predniSONE (DELTASONE) 50 MG tablet Take 1 tablet (50 mg total) by mouth daily with breakfast. (Patient not taking: Reported on 07/27/2021) 5 tablet 0   No current facility-administered medications on file prior to visit.    There are no Patient Instructions on file for this visit. No follow-ups on file.   Kris Hartmann, NP

## 2021-08-10 ENCOUNTER — Encounter: Payer: Self-pay | Admitting: Oncology

## 2021-08-10 ENCOUNTER — Inpatient Hospital Stay: Payer: Medicare HMO | Attending: Oncology | Admitting: Oncology

## 2021-08-10 ENCOUNTER — Other Ambulatory Visit: Payer: Self-pay

## 2021-08-10 ENCOUNTER — Inpatient Hospital Stay: Payer: Medicare HMO

## 2021-08-10 ENCOUNTER — Telehealth: Payer: Self-pay | Admitting: Oncology

## 2021-08-10 VITALS — BP 134/92 | Temp 96.9°F | Resp 18 | Ht 63.0 in | Wt 207.3 lb

## 2021-08-10 DIAGNOSIS — Z7189 Other specified counseling: Secondary | ICD-10-CM | POA: Insufficient documentation

## 2021-08-10 DIAGNOSIS — Z90710 Acquired absence of both cervix and uterus: Secondary | ICD-10-CM | POA: Insufficient documentation

## 2021-08-10 DIAGNOSIS — Z806 Family history of leukemia: Secondary | ICD-10-CM | POA: Diagnosis not present

## 2021-08-10 DIAGNOSIS — C499 Malignant neoplasm of connective and soft tissue, unspecified: Secondary | ICD-10-CM | POA: Insufficient documentation

## 2021-08-10 DIAGNOSIS — E119 Type 2 diabetes mellitus without complications: Secondary | ICD-10-CM | POA: Diagnosis not present

## 2021-08-10 DIAGNOSIS — Z803 Family history of malignant neoplasm of breast: Secondary | ICD-10-CM | POA: Insufficient documentation

## 2021-08-10 DIAGNOSIS — Z809 Family history of malignant neoplasm, unspecified: Secondary | ICD-10-CM | POA: Insufficient documentation

## 2021-08-10 LAB — CBC WITH DIFFERENTIAL/PLATELET
Abs Immature Granulocytes: 0.09 10*3/uL — ABNORMAL HIGH (ref 0.00–0.07)
Basophils Absolute: 0 10*3/uL (ref 0.0–0.1)
Basophils Relative: 0 %
Eosinophils Absolute: 0.2 10*3/uL (ref 0.0–0.5)
Eosinophils Relative: 2 %
HCT: 36.7 % (ref 36.0–46.0)
Hemoglobin: 12.1 g/dL (ref 12.0–15.0)
Immature Granulocytes: 1 %
Lymphocytes Relative: 36 %
Lymphs Abs: 3 10*3/uL (ref 0.7–4.0)
MCH: 28 pg (ref 26.0–34.0)
MCHC: 33 g/dL (ref 30.0–36.0)
MCV: 85 fL (ref 80.0–100.0)
Monocytes Absolute: 0.6 10*3/uL (ref 0.1–1.0)
Monocytes Relative: 7 %
Neutro Abs: 4.5 10*3/uL (ref 1.7–7.7)
Neutrophils Relative %: 54 %
Platelets: 210 10*3/uL (ref 150–400)
RBC: 4.32 MIL/uL (ref 3.87–5.11)
RDW: 13.7 % (ref 11.5–15.5)
WBC: 8.4 10*3/uL (ref 4.0–10.5)
nRBC: 0 % (ref 0.0–0.2)

## 2021-08-10 LAB — COMPREHENSIVE METABOLIC PANEL
ALT: 14 U/L (ref 0–44)
AST: 19 U/L (ref 15–41)
Albumin: 3.8 g/dL (ref 3.5–5.0)
Alkaline Phosphatase: 56 U/L (ref 38–126)
Anion gap: 11 (ref 5–15)
BUN: 11 mg/dL (ref 6–20)
CO2: 22 mmol/L (ref 22–32)
Calcium: 8.7 mg/dL — ABNORMAL LOW (ref 8.9–10.3)
Chloride: 103 mmol/L (ref 98–111)
Creatinine, Ser: 0.72 mg/dL (ref 0.44–1.00)
GFR, Estimated: >60 mL/min (ref >60)
Glucose, Bld: 145 mg/dL — ABNORMAL HIGH (ref 70–99)
Potassium: 3.7 mmol/L (ref 3.5–5.1)
Sodium: 136 mmol/L (ref 135–145)
Total Bilirubin: 0.5 mg/dL (ref 0.3–1.2)
Total Protein: 7.2 g/dL (ref 6.5–8.1)

## 2021-08-10 LAB — LACTATE DEHYDROGENASE: LDH: 140 U/L (ref 98–192)

## 2021-08-10 NOTE — Assessment & Plan Note (Signed)
Pathology was reviewed and discussed with patient and her daughter. Recommend further staging with CT chest abdomen pelvis with contrast as well as MRI tibia-fibula.  She Fort need additional images/biopsies of left forearm and left thigh subcutaneous masses. If localized disease, I recommend patient to establish care with a sarcoma surgeon for evaluation of need of additional excision followed by radiation.  She Pamela Torres need to have a second opinion with [facility] sarcoma expert.

## 2021-08-10 NOTE — Assessment & Plan Note (Signed)
Discussed with patient

## 2021-08-10 NOTE — Progress Notes (Signed)
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 Recommend further staging with CT chest abdomen pelvis with contrast as well as MRI tibia-fibula.  She Scardino need additional images/biopsies of left forearm and left thigh subcutaneous masses. If localized disease, I recommend patient to establish care with a sarcoma surgeon for evaluation of need of additional excision followed by radiation.  She Litz need to have a second opinion with [facility] sarcoma expert.  Goals of care, counseling/discussion Discussed with patient  Family history of cancer Refer patient to establish care with genetic counselor for genetic testing.   Orders Placed This Encounter  Procedures   MR TIBIA FIBULA RIGHT W WO CONTRAST    Patient had spot removed from right calf    Standing Status:   Future    Standing Expiration Date:   08/11/2022    Order Specific Question:   If indicated for the ordered procedure, I authorize the administration of contrast media per Radiology protocol    Answer:   Yes    Order Specific Question:   What is the patient's sedation requirement?    Answer:   No Sedation    Order Specific Question:   Does the patient have a pacemaker or implanted devices?    Answer:   No    Order Specific Question:   Call Results- Best Contact Number?    Answer:   [phone] / do not hold patient    Order Specific Question:   Preferred imaging location?    Answer:   [facility] (table limit - 550lbs)   CT CHEST ABDOMEN PELVIS W CONTRAST    Standing Status:   Future    Standing Expiration Date:   08/11/2022    Order Specific Question:   Preferred imaging location?    Answer:   [facility]    Order Specific Question:   Is Oral Contrast requested for this exam?    Answer:   Yes, Per  Radiology protocol    Order Specific Question:   Call Results- Best Contact Number?    Answer:   [phone] / do not hold patient    Order Specific Question:   Is patient pregnant?    Answer:   No   CBC with Differential/Platelet    Standing Status:   Future    Number of Occurrences:   1    Standing Expiration Date:   08/11/2022   Comprehensive metabolic panel    Standing Status:   Future    Number of Occurrences:   1    Standing Expiration Date:   08/11/2022   Lactate dehydrogenase    Standing Status:   Future    Number of Occurrences:   1    Standing Expiration Date:   08/11/2022   Follow-up to be determined.   All questions were answered. The patient knows to call the clinic with any problems, questions or concerns. No barriers to learning was detected.  Earlie Server, MD 08/10/2021   CHIEF COMPLAINTS/PURPOSE OF CONSULTATION:  Soft tissue myxofibrosarcoma  HISTORY OF PRESENTING ILLNESS:  Pamela Torres 56 y.o. 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 I have reviewed her chart and materials related to her cancer extensively and collaborated history with the patient. 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  Previously seen by vascular surgeon who felt this was probably thrombophlebitis and was treated with low-dose Eliquis which she did not tolerate.  She has noticed progressively enlarging of the size of the lesion and increased tenderness.  Ultrasound showed a subcutaneous complex fluid collection, felt to be hematoma. Patient was seen by  vascular surgeon Dr. Bunnie Domino team and was recommended excision.    07/29/2021 Surgery   Patient underwent right calf mass resection by Dr. Lucky Cowboy Pathology showed malignant spindle cell neoplasm.  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  So, extends to multiple inked margin.   08/10/2021 Cancer Staging   Staging form: Soft Tissue Sarcoma, AJCC 7th Edition - Clinical stage from 08/10/2021: Stage Unknown (T2, NX, M0, G3) - Signed by Earlie Server, MD on 08/10/2021 Stage prefix: Initial diagnosis Residual tumor (R): R1 - Microscopic   Today patient was referred to establish care with oncology.   She has had her stitches removed. + Still have tenderness at the site of resection. + Cough for about a week, no fever or chills.  Denies any hemoptysis, shortness of breath.  Never smoker. + Chronic diarrhea Appetite is not as good as previously.  She noticed losing weight within the past 6 to 12 months. She has also noticed a small subcutaneous lesion on her left forearm and a subcutaneous lesion of left thigh. She cannot recall how long ago she noticed these 2 lesions.  + Family history positive for father with leukemia, paternal aunt breast cancer, maternal aunt brain cancer   MEDICAL HISTORY:  Past Medical History:  Diagnosis Date   Anemia during pregnancy    Anxiety    Chronic back pain    Cyst in hand    on left hand   DVT (deep vein thrombosis) in pregnancy    right leg   Gout    History of kidney stones    Hx of migraines    OSA on CPAP    PAC (premature atrial contraction) 2018   Sciatic pain    T2DM (type 2 diabetes mellitus) ([place])    Wears glasses     SURGICAL HISTORY: Past Surgical History:  Procedure Laterality Date   ABDOMINAL HYSTERECTOMY     APPLICATION OF WOUND VAC Right 07/29/2021   Procedure: APPLICATION OF WOUND VAC;  Surgeon: Algernon Huxley, MD;  Location: [facility] ORS;   Service: Vascular;  Laterality: Right;   BACK SURGERY  2014   microdiskectomy - lumbar   CARDIAC CATHETERIZATION     [facility] normal cononary anatomy, EF 64%   CESAREAN SECTION     INCISION AND DRAINAGE ABSCESS Right 07/29/2021   Procedure: INCISION AND DRAINAGE ABSCESS;  Surgeon: Algernon Huxley, MD;  Location: [facility] ORS;  Service: Vascular;  Laterality: Right;   KNEE SURGERY Right    arthroscopy   LUMBAR LAMINECTOMY/DECOMPRESSION MICRODISCECTOMY Right 12/10/2013   Procedure: Right Lumbar Five to Sacral One Redo Microdiskectomy;  Surgeon: Erline Levine, MD;  Location: [place] [facility];  Service: Neurosurgery;  Laterality: Right;  Right  L5-S1 Redo Microdiskectomy   MAXIMUM ACCESS (MAS)POSTERIOR LUMBAR INTERBODY FUSION (PLIF) 1 LEVEL N/A 06/26/2014   Procedure: Lumbar five sacral one Maximum Access Posterior Lumbar Interbody Fusion;  Surgeon: Erline Levine, MD;  Location: [place] [facility];  Service: Neurosurgery;  Laterality: N/A;   ROTATOR CUFF REPAIR Right    TRANSFORAMINAL LUMBAR INTERBODY FUSION (TLIF) WITH PEDICLE SCREW FIXATION 1 LEVEL N/A 07/28/2020   Procedure: Lumbar Four-Five Transforaminal lumbar interbody fusion with exploration of Lumbar Five- Sacral One Fusion;  Surgeon: Erline Levine, MD;  Location: [place];  Service: Neurosurgery;  Laterality: N/A;  Lumbar Four-Five Transforaminal lumbar interbody fusion with exploration of Lumbar Five- Sacral One Fusion    SOCIAL HISTORY: Social History   Socioeconomic History   Marital status: Married    Spouse name: Not on file   Number of children: Not on file   Years of education: Not on file   Highest education level: Not on file  Occupational History   Not on file  Tobacco Use   Smoking status: Never   Smokeless tobacco: Never  Vaping Use   Vaping Use: Never used  Substance and Sexual Activity   Alcohol use: No   Drug use: No   Sexual activity: Not on file  Other Topics Concern   Not on file  Social History Narrative   Lives at  home with husband.    Social Determinants of Health   Financial Resource Strain: Not on file  Food Insecurity: Not on file  Transportation Needs: Not on file  Physical Activity: Not on file  Stress: Not on file  Social Connections: Not on file  Intimate Partner Violence: Not on file    FAMILY HISTORY: Family History  Problem Relation Age of Onset   Thyroid disease Mother    COPD Mother    Emphysema Mother    Diabetes Father    Heart attack Father    Leukemia Father    Hypertension Brother    Diabetes Brother    Diabetes Brother    Hypertension Brother    Diabetes Brother    Breast cancer Maternal Aunt     ALLERGIES:  is allergic to simvastatin.  MEDICATIONS:  Current Outpatient Medications  Medication Sig Dispense Refill   Cholecalciferol (VITAMIN D3 PO) Take 1 capsule by mouth daily.     gabapentin (NEURONTIN) 300 MG capsule Take 300-600 mg by mouth See admin instructions. Take 300 mg by mouth in the morning and afternoon and 600 mg at bedtime     metFORMIN (GLUCOPHAGE) 500 MG tablet Take 500 mg by mouth 2 (two) times daily.     sertraline (ZOLOFT) 50 MG tablet Take 50 mg by mouth daily.     traMADol (ULTRAM) 50 MG tablet Take 1 tablet (50 mg total) by mouth every 6 (six) hours as needed. 20 tablet 0   No current facility-administered medications for this visit.    Review of Systems  Constitutional:  Positive for appetite change, fatigue and unexpected weight change. Negative for chills and fever.  HENT:   Negative for hearing loss and voice change.   Eyes:  Negative for eye problems.  Respiratory:  Positive for cough. Negative for chest tightness.   Cardiovascular:  Negative for chest pain.  Gastrointestinal:  Positive for diarrhea. Negative for abdominal distention, abdominal pain and blood in stool.  Endocrine: Negative for hot flashes.  Genitourinary:  Negative for difficulty urinating and frequency.   Musculoskeletal:  Negative for arthralgias.       Right  calf mass resection, tenderness at the site of resection.  Skin:  Negative for itching and rash.  Neurological:  Negative for extremity weakness.  Hematological:  Negative for adenopathy.  Psychiatric/Behavioral:  Negative for confusion.      PHYSICAL EXAMINATION: ECOG PERFORMANCE STATUS: 1 - Symptomatic but completely ambulatory  Vitals:   08/10/21 1451  BP: (!) 134/92  Resp: 18  Temp: (!) 96.9 F (36.1 C)   Filed Weights   08/10/21 1451  Weight: 207 lb 4.8 oz (94 kg)    Physical Exam Constitutional:      General: She is not in acute distress.    Appearance: She is obese. She is not diaphoretic.  HENT:     Head: Normocephalic and atraumatic.     Nose: Nose normal.     Mouth/Throat:     Pharynx: No oropharyngeal exudate.  Eyes:     General: No scleral icterus.    Pupils: Pupils are equal, round, and reactive to light.  Cardiovascular:     Rate and Rhythm: Normal rate and regular rhythm.     Heart sounds: No murmur heard. Pulmonary:     Effort: Pulmonary effort is normal. No respiratory distress.     Breath sounds: No rales.  Chest:     Chest wall: No tenderness.  Abdominal:     General: There is no distension.     Palpations: Abdomen is soft.     Tenderness: There is no abdominal tenderness.  Musculoskeletal:        General: Normal range of motion.     Cervical back: Normal range of motion and neck supple.     Comments: Right calf s/p mass resection, focal tenderness, covered with steri-strips Left forearm 2cm mobile subcutaneous lesion.  Left thigh, inferior 2-3cm mobile subcutaneous lesion.   Skin:    General: Skin is warm and dry.     Findings: No erythema.  Neurological:     Mental Status: She is alert and oriented to person, place, and time.     Cranial Nerves: No cranial nerve deficit.     Motor: No abnormal muscle tone.     Coordination: Coordination normal.  Psychiatric:        Mood and Affect: Affect normal.      LABORATORY DATA:  I have  reviewed the data as listed Lab Results  Component Value Date   WBC 8.4 08/10/2021   HGB 12.1 08/10/2021   HCT 36.7 08/10/2021   MCV 85.0 08/10/2021   PLT 210 08/10/2021   Recent Labs    11/23/20 1048 07/15/21 1657 08/10/21 1540  NA 138 140 136  K 4.0 3.8 3.7  CL 105 105 103  CO2 '27 23 22  '$ GLUCOSE 105* 114* 145*  BUN '14 9 11  '$ CREATININE 0.75 0.64 0.72  CALCIUM 9.0 9.2 8.7*  GFRNONAA >60 >60 >60  PROT  --  7.2 7.2  ALBUMIN  --  3.8 3.8  AST  --  20 19  ALT  --  15 14  ALKPHOS  --  53 56  BILITOT  --  0.5 0.5    RADIOGRAPHIC STUDIES: I have personally reviewed the radiological images as listed and agreed with the findings in the report. [place] RT LOWER EXTREM LTD SOFT TISSUE NON VASCULAR  Result Date: 07/15/2021 CLINICAL DATA:  Painful swelling of the right calf. EXAM: ULTRASOUND right LOWER EXTREMITY LIMITED TECHNIQUE: Ultrasound examination of the lower extremity soft tissues was performed in the area of clinical concern. COMPARISON:  CT scan 11/23/2020 FINDINGS: There is a large complex subcutaneous fluid collection involving the right inner mid to upper calf measuring approximately 7.0 x 2.2 x 4.8 cm. This is most likely a hematoma. Recommend clinical correlation. IMPRESSION: Large complex subcutaneous fluid collection most likely a hematoma. Recommend correlation with clinical findings. Electronically Signed   By: Marijo Sanes M.D.   On: 07/15/2021 18:52

## 2021-08-10 NOTE — Telephone Encounter (Signed)
spoke with pt daughter, she is [facility] to pick up prep kit now, mom will be here tomorrow 06/14 for CT and MRI appt

## 2021-08-10 NOTE — Assessment & Plan Note (Signed)
Refer patient to establish care with genetic counselor for genetic testing.

## 2021-08-11 ENCOUNTER — Ambulatory Visit
Admission: RE | Admit: 2021-08-11 | Discharge: 2021-08-11 | Disposition: A | Discharge status: Home / Self Care | Payer: Medicare HMO | Source: Ambulatory Visit | Attending: Oncology | Admitting: Oncology

## 2021-08-11 DIAGNOSIS — I700 Atherosclerosis of aorta: Secondary | ICD-10-CM | POA: Insufficient documentation

## 2021-08-11 DIAGNOSIS — N200 Calculus of kidney: Secondary | ICD-10-CM | POA: Diagnosis not present

## 2021-08-11 DIAGNOSIS — R160 Hepatomegaly, not elsewhere classified: Secondary | ICD-10-CM | POA: Diagnosis not present

## 2021-08-11 DIAGNOSIS — R918 Other nonspecific abnormal finding of lung field: Secondary | ICD-10-CM | POA: Insufficient documentation

## 2021-08-11 DIAGNOSIS — C499 Malignant neoplasm of connective and soft tissue, unspecified: Secondary | ICD-10-CM

## 2021-08-11 MED ORDER — IOHEXOL 300 MG/ML  SOLN
100.0000 mL | Freq: Once | INTRAMUSCULAR | Status: AC | PRN
  Administered 2021-08-11: 100 mL via INTRAVENOUS

## 2021-08-11 MED ORDER — GADOBUTROL 1 MMOL/ML IV SOLN
10.0000 mL | Freq: Once | INTRAVENOUS | Status: AC | PRN
  Administered 2021-08-11: 10 mL via INTRAVENOUS

## 2021-08-13 ENCOUNTER — Telehealth: Payer: Self-pay | Admitting: *Deleted

## 2021-08-13 NOTE — Telephone Encounter (Signed)
Patient called asking if Dr Tasia Catchings has gotten results of CT and MRI yet  CT  IMPRESSION: 1. No evidence of mass, lymphadenopathy, or definite evidence of metastatic disease in the chest, abdomen, or pelvis. 2. Small pulmonary nodules of the lower lobes measuring up to 0.5 cm, nonspecific although statistically most likely incidental, benign, and infectious or inflammatory. Attention on follow-up. 3. Background of very fine centrilobular pulmonary nodules most concentrated in the lung apices, most commonly seen in smoking-related respiratory bronchiolitis. 4. Hepatomegaly. Somewhat coarse contour of the liver, suggestive of cirrhosis. Correlate with biochemical findings. 5. Bilateral nonobstructive nephrolithiasis.   Aortic Atherosclerosis (ICD10-I70.0).     Electronically Signed   By: Delanna Ahmadi M.D.   On: 08/13/2021 07:09   MRI IMPRESSION: 1. A 3.7 x 0.8 x 3.1 cm fluid collection in the subcutaneous fat along the medial aspect of the proximal right lower leg likely reflecting a postoperative hematoma or seroma. No enhancing soft tissue mass to suggest residual or recurrent neoplasm. 2. Tricompartmental cartilage abnormalities of the right knee.     Electronically Signed   By: Kathreen Devoid M.D.   On: 08/13/2021 07:32

## 2021-08-16 ENCOUNTER — Other Ambulatory Visit: Payer: Self-pay

## 2021-08-16 DIAGNOSIS — C499 Malignant neoplasm of connective and soft tissue, unspecified: Secondary | ICD-10-CM

## 2021-08-16 NOTE — Telephone Encounter (Signed)
Call returned to patient and informed of doctor response. She will await call from doctor with further instructions once she speaks with [facility] specialists

## 2021-08-16 NOTE — Telephone Encounter (Signed)
I have placed referral and faxed over last encounter note, insurance info, and demographics to [facility].

## 2021-08-23 ENCOUNTER — Telehealth: Payer: Self-pay | Admitting: *Deleted

## 2021-08-23 NOTE — Telephone Encounter (Signed)
Daughter called asking if the referral got sent as they have not heard form [facility] yet. Please return her call

## 2021-08-23 NOTE — Telephone Encounter (Signed)
She can call the Sarcoma department at [phone]

## 2021-09-01 ENCOUNTER — Ambulatory Visit (INDEPENDENT_AMBULATORY_CARE_PROVIDER_SITE_OTHER): Payer: Medicare HMO | Admitting: Nurse Practitioner

## 2021-10-20 ENCOUNTER — Other Ambulatory Visit: Payer: Self-pay

## 2021-12-01 ENCOUNTER — Telehealth (INDEPENDENT_AMBULATORY_CARE_PROVIDER_SITE_OTHER): Payer: Self-pay | Admitting: Vascular Surgery

## 2021-12-01 NOTE — Telephone Encounter (Signed)
Davy Pique called stating date needed to be changed on order for clarification on face to face.  Her number s [phone].

## 2021-12-03 NOTE — Telephone Encounter (Signed)
Davy Pique brought orders over to the office and the orders will be fax once completed.

## 2021-12-27 ENCOUNTER — Encounter (INDEPENDENT_AMBULATORY_CARE_PROVIDER_SITE_OTHER): Payer: Self-pay

## 2022-10-01 ENCOUNTER — Other Ambulatory Visit: Payer: Self-pay

## 2022-10-01 ENCOUNTER — Emergency Department
Admission: EM | Admit: 2022-10-01 | Discharge: 2022-10-01 | Disposition: A | Discharge status: Home / Self Care | Payer: Medicare HMO | Attending: Emergency Medicine | Admitting: Emergency Medicine

## 2022-10-01 DIAGNOSIS — C499 Malignant neoplasm of connective and soft tissue, unspecified: Secondary | ICD-10-CM | POA: Insufficient documentation

## 2022-10-01 DIAGNOSIS — E1165 Type 2 diabetes mellitus with hyperglycemia: Secondary | ICD-10-CM | POA: Diagnosis not present

## 2022-10-01 DIAGNOSIS — R739 Hyperglycemia, unspecified: Secondary | ICD-10-CM | POA: Diagnosis present

## 2022-10-01 HISTORY — DX: Malignant neoplasm of connective and soft tissue, unspecified: C49.9

## 2022-10-01 LAB — CBG MONITORING, ED
Glucose-Capillary: 374 mg/dL — ABNORMAL HIGH (ref 70–99)
Glucose-Capillary: 258 mg/dL — ABNORMAL HIGH (ref 70–99)

## 2022-10-01 LAB — URINALYSIS, ROUTINE W REFLEX MICROSCOPIC
Bilirubin Urine: NEGATIVE
Glucose, UA: >=500 mg/dL — AB
Ketones, ur: 5 mg/dL — AB
Nitrite: NEGATIVE
Protein, ur: NEGATIVE mg/dL
Specific Gravity, Urine: 1.023 (ref 1.005–1.030)
pH: 5 (ref 5.0–8.0)

## 2022-10-01 LAB — CBC
HCT: 26.4 % — ABNORMAL LOW (ref 36.0–46.0)
Hemoglobin: 9.5 g/dL — ABNORMAL LOW (ref 12.0–15.0)
MCH: 31.1 pg (ref 26.0–34.0)
MCHC: 36 g/dL (ref 30.0–36.0)
MCV: 86.6 fL (ref 80.0–100.0)
Platelets: 112 10*3/uL — ABNORMAL LOW (ref 150–400)
RBC: 3.05 MIL/uL — ABNORMAL LOW (ref 3.87–5.11)
RDW: 17.1 % — ABNORMAL HIGH (ref 11.5–15.5)
WBC: 8.3 10*3/uL (ref 4.0–10.5)
nRBC: 0 % (ref 0.0–0.2)

## 2022-10-01 LAB — BASIC METABOLIC PANEL
Anion gap: 11 (ref 5–15)
BUN: 29 mg/dL — ABNORMAL HIGH (ref 6–20)
CO2: 21 mmol/L — ABNORMAL LOW (ref 22–32)
Calcium: 9 mg/dL (ref 8.9–10.3)
Chloride: 100 mmol/L (ref 98–111)
Creatinine, Ser: 1.05 mg/dL — ABNORMAL HIGH (ref 0.44–1.00)
GFR, Estimated: >60 mL/min (ref >60)
Glucose, Bld: 452 mg/dL — ABNORMAL HIGH (ref 70–99)
Potassium: 4.2 mmol/L (ref 3.5–5.1)
Sodium: 132 mmol/L — ABNORMAL LOW (ref 135–145)

## 2022-10-01 MED ORDER — ACETAMINOPHEN 325 MG PO TABS
650.0000 mg | ORAL_TABLET | Freq: Once | ORAL | Status: AC
  Administered 2022-10-01: 650 mg via ORAL
  Filled 2022-10-01: qty 2

## 2022-10-01 MED ORDER — INSULIN ASPART 100 UNIT/ML IJ SOLN
10.0000 [IU] | Freq: Once | INTRAMUSCULAR | Status: AC
  Administered 2022-10-01: 10 [IU] via INTRAVENOUS
  Filled 2022-10-01: qty 1

## 2022-10-01 MED ORDER — GLIPIZIDE 5 MG PO TABS: 5.0000 mg | ORAL_TABLET | Freq: Every day | ORAL | 0 refills | Status: AC

## 2022-10-01 MED ORDER — SODIUM CHLORIDE 0.9 % IV BOLUS
1000.0000 mL | Freq: Once | INTRAVENOUS | Status: AC
  Administered 2022-10-01: 1000 mL via INTRAVENOUS

## 2022-10-01 NOTE — ED Provider Notes (Signed)
[facility] Provider Note   Event Date/Time   First MD Initiated Contact with Patient 10/01/22 1927     (approximate) History  Hyperglycemia  HPI Pamela Torres is a 57 y.o. female with past medical history of myxofibrosarcoma on chemotherapy who presents complaining of of hyperglycemia with associated polydipsia and polyuria.  Patient states that she had to be given insulin before her most recent cycle of chemotherapy due to hyperglycemia with sugars in the 400s as well.  Patient states that she only takes 1000 mg of metformin a day.  Patient also complains of associated headache ROS: Patient currently denies any vision changes, tinnitus, difficulty speaking, facial droop, sore throat, chest pain, shortness of breath, abdominal pain, nausea/vomiting/diarrhea, dysuria, or weakness/numbness/paresthesias in any extremity   Physical Exam  Triage Vital Signs: ED Triage Vitals  Encounter Vitals Group     BP 10/01/22 1748 136/76     Systolic BP Percentile --      Diastolic BP Percentile --      Pulse Rate 10/01/22 1748 91     Resp 10/01/22 1748 20     Temp 10/01/22 1748 97.8 F (36.6 C)     Temp src --      SpO2 10/01/22 1748 98 %     Weight 10/01/22 1748 200 lb (90.7 kg)     Height 10/01/22 1747 5' (1.524 m)     Head Circumference --      Peak Flow --      Pain Score 10/01/22 1747 7     Pain Loc --      Pain Education --      Exclude from Growth Chart --    Most recent vital signs: Vitals:   10/01/22 2100 10/01/22 2130  BP: 124/78 126/67  Pulse: 75 76  Resp: 19   Temp:    SpO2: 98% 97%   General: Awake, oriented x4. CV:  Good peripheral perfusion.  Resp:  Normal effort.  Abd:  No distention.  Other:  Middle-aged obese Caucasian female laying in bed in no acute distress.  Partial scalp alopecia present ED Results / Procedures / Treatments  Labs (all labs ordered are listed, but only abnormal results are displayed) Labs Reviewed  BASIC METABOLIC  PANEL - Abnormal; Notable for the following components:      Result Value   Sodium 132 (*)    CO2 21 (*)    Glucose, Bld 452 (*)    BUN 29 (*)    Creatinine, Ser 1.05 (*)    All other components within normal limits  CBC - Abnormal; Notable for the following components:   RBC 3.05 (*)    Hemoglobin 9.5 (*)    HCT 26.4 (*)    RDW 17.1 (*)    Platelets 112 (*)    All other components within normal limits  URINALYSIS, ROUTINE W REFLEX MICROSCOPIC - Abnormal; Notable for the following components:   Color, Urine YELLOW (*)    APPearance CLEAR (*)    Glucose, UA >=500 (*)    Hgb urine dipstick SMALL (*)    Ketones, ur 5 (*)    Leukocytes,Ua SMALL (*)    Bacteria, UA RARE (*)    All other components within normal limits  CBG MONITORING, ED - Abnormal; Notable for the following components:   Glucose-Capillary 374 (*)    All other components within normal limits  CBG MONITORING, ED - Abnormal; Notable for the following components:   Glucose-Capillary 258 (*)  All other components within normal limits  PROCEDURES: Critical Care performed: No .1-3 Lead EKG Interpretation  Performed by: Merwyn Katos, MD Authorized by: Merwyn Katos, MD     Interpretation: normal     ECG rate:  71   ECG rate assessment: normal     Rhythm: sinus rhythm     Ectopy: none     Conduction: normal    MEDICATIONS ORDERED IN ED: Medications  acetaminophen (TYLENOL) tablet 650 mg (650 mg Oral Given 10/01/22 2031)  sodium chloride 0.9 % bolus 1,000 mL (0 mLs Intravenous Stopped 10/01/22 2105)  insulin aspart (novoLOG) injection 10 Units (10 Units Intravenous Given 10/01/22 2035)   IMPRESSION / MDM / ASSESSMENT AND PLAN / ED COURSE  I reviewed the triage vital signs and the nursing notes.                             The patient is on the cardiac monitor to evaluate for evidence of arrhythmia and/or significant heart rate changes. Patient's presentation is most consistent with acute presentation with  potential threat to life or bodily function. Patients presentation most consistent with hyperglycemic state WITHOUT evidence of DKA. Given Exam, History, and Workup I have low suspicion for an emergent precipitating factor of this hyperglycemic state such as atypical MI, acute abdomen, or other serious bacterial illness. Patient is Type 2 Diabetic with changes in medication regimen/adherence.  Findings: Patient without AGAP or significant ketones in urine to suggest DKA Interventions: IVF bolus  Re-evaluation: Patients serum glucose downtrended significantly with stable electrolytes and no anion gap at this time.  Disposition: Discharge home with appropriate insulin regimen and prompt PCP follow up instructions.   FINAL CLINICAL IMPRESSION(S) / ED DIAGNOSES   Final diagnoses:  Hyperglycemia   Rx / DC Orders   ED Discharge Orders          Ordered    glipiZIDE (GLUCOTROL) 5 MG tablet  Daily        10/01/22 2056           Note:  This document was prepared using Dragon voice recognition software and [place] include unintentional dictation errors.   Merwyn Katos, MD 10/01/22 [phone]

## 2022-10-01 NOTE — ED Triage Notes (Signed)
Patient states elevated blood sugar; reports headache and blurred vision.

## 2023-07-18 ENCOUNTER — Encounter (HOSPITAL_COMMUNITY): Payer: Self-pay | Admitting: *Deleted

## 2023-07-18 ENCOUNTER — Other Ambulatory Visit: Payer: Self-pay

## 2023-07-18 ENCOUNTER — Emergency Department (HOSPITAL_COMMUNITY)
Admission: EM | Admit: 2023-07-18 | Discharge: 2023-07-18 | Disposition: A | Discharge status: Home / Self Care | Attending: Emergency Medicine | Admitting: Emergency Medicine

## 2023-07-18 ENCOUNTER — Emergency Department (HOSPITAL_COMMUNITY)

## 2023-07-18 DIAGNOSIS — Z7984 Long term (current) use of oral hypoglycemic drugs: Secondary | ICD-10-CM | POA: Insufficient documentation

## 2023-07-18 DIAGNOSIS — C4921 Malignant neoplasm of connective and soft tissue of right lower limb, including hip: Secondary | ICD-10-CM | POA: Diagnosis not present

## 2023-07-18 DIAGNOSIS — E119 Type 2 diabetes mellitus without complications: Secondary | ICD-10-CM | POA: Diagnosis not present

## 2023-07-18 DIAGNOSIS — R112 Nausea with vomiting, unspecified: Secondary | ICD-10-CM | POA: Diagnosis present

## 2023-07-18 DIAGNOSIS — C499 Malignant neoplasm of connective and soft tissue, unspecified: Secondary | ICD-10-CM

## 2023-07-18 DIAGNOSIS — R110 Nausea: Secondary | ICD-10-CM

## 2023-07-18 LAB — COMPREHENSIVE METABOLIC PANEL WITH GFR
ALT: 51 U/L — ABNORMAL HIGH (ref 0–44)
AST: 61 U/L — ABNORMAL HIGH (ref 15–41)
Albumin: 3.2 g/dL — ABNORMAL LOW (ref 3.5–5.0)
Alkaline Phosphatase: 54 U/L (ref 38–126)
Anion gap: 11 (ref 5–15)
BUN: 10 mg/dL (ref 6–20)
CO2: 20 mmol/L — ABNORMAL LOW (ref 22–32)
Calcium: 9 mg/dL (ref 8.9–10.3)
Chloride: 104 mmol/L (ref 98–111)
Creatinine, Ser: 0.77 mg/dL (ref 0.44–1.00)
GFR, Estimated: >60 mL/min (ref >60)
Glucose, Bld: 101 mg/dL — ABNORMAL HIGH (ref 70–99)
Potassium: 3.6 mmol/L (ref 3.5–5.1)
Sodium: 135 mmol/L (ref 135–145)
Total Bilirubin: 1.4 mg/dL — ABNORMAL HIGH (ref 0.0–1.2)
Total Protein: 6.3 g/dL — ABNORMAL LOW (ref 6.5–8.1)

## 2023-07-18 LAB — CBC WITH DIFFERENTIAL/PLATELET
Abs Immature Granulocytes: 0.02 10*3/uL (ref 0.00–0.07)
Basophils Absolute: 0 10*3/uL (ref 0.0–0.1)
Basophils Relative: 0 %
Eosinophils Absolute: 0 10*3/uL (ref 0.0–0.5)
Eosinophils Relative: 0 %
HCT: 37.6 % (ref 36.0–46.0)
Hemoglobin: 13.2 g/dL (ref 12.0–15.0)
Immature Granulocytes: 0 %
Lymphocytes Relative: 34 %
Lymphs Abs: 1.6 10*3/uL (ref 0.7–4.0)
MCH: 32.3 pg (ref 26.0–34.0)
MCHC: 35.1 g/dL (ref 30.0–36.0)
MCV: 91.9 fL (ref 80.0–100.0)
Monocytes Absolute: 0.3 10*3/uL (ref 0.1–1.0)
Monocytes Relative: 5 %
Neutro Abs: 2.9 10*3/uL (ref 1.7–7.7)
Neutrophils Relative %: 61 %
Platelets: 183 10*3/uL (ref 150–400)
RBC: 4.09 MIL/uL (ref 3.87–5.11)
RDW: 17.9 % — ABNORMAL HIGH (ref 11.5–15.5)
WBC: 4.9 10*3/uL (ref 4.0–10.5)
nRBC: 0 % (ref 0.0–0.2)

## 2023-07-18 LAB — LIPASE, BLOOD: Lipase: 28 U/L (ref 11–51)

## 2023-07-18 MED ORDER — DOXYCYCLINE HYCLATE 100 MG PO CAPS: 100.0000 mg | ORAL_CAPSULE | Freq: Two times a day (BID) | ORAL | 0 refills | Status: DC

## 2023-07-18 MED ORDER — HYDROMORPHONE HCL 1 MG/ML IJ SOLN
1.0000 mg | Freq: Once | INTRAMUSCULAR | Status: AC
  Administered 2023-07-18: 1 mg via INTRAVENOUS
  Filled 2023-07-18: qty 1

## 2023-07-18 MED ORDER — ONDANSETRON 4 MG PO TBDP
4.0000 mg | ORAL_TABLET | Freq: Once | ORAL | Status: AC
Start: 2023-07-18 — End: 2023-07-18
  Administered 2023-07-18: 4 mg via ORAL
  Filled 2023-07-18: qty 1

## 2023-07-18 MED ORDER — ONDANSETRON 4 MG PO TBDP: 4.0000 mg | ORAL_TABLET | Freq: Three times a day (TID) | ORAL | 0 refills | Status: AC | PRN

## 2023-07-18 MED ORDER — ONDANSETRON HCL 4 MG/2ML IJ SOLN: 4.0000 mg | Freq: Once | INTRAMUSCULAR | Status: DC

## 2023-07-18 MED ORDER — OXYCODONE HCL 5 MG PO TABS
5.0000 mg | ORAL_TABLET | ORAL | Status: AC
  Administered 2023-07-18: 5 mg via ORAL
  Filled 2023-07-18: qty 1

## 2023-07-18 NOTE — ED Triage Notes (Signed)
 Pt BIB [facility] for nausea, pt states she gets dizzy with standing up, starting today. EMS reports CBG 135, 178/84, HR 84.  EMS reports IV started and 4mg  Zofran  given en route.  Pt is taking chemo

## 2023-07-18 NOTE — Discharge Instructions (Signed)
 You were seen for your nausea and vomiting in the emergency department.   At home, please stop the Keflex .  Start taking Zofran  for nausea and vomiting.  Take the doxycycline as well to prevent infection.    Check your MyChart online for the results of any tests that had not resulted by the time you left the emergency department.   Follow-up with your primary doctor in 2-3 days regarding your visit.    Return immediately to the emergency department if you experience any of the following: Fevers, worsening pain, or any other concerning symptoms.    Thank you for visiting our Emergency Department. It was a pleasure taking care of you today.

## 2023-07-18 NOTE — ED Provider Notes (Signed)
 [place] EMERGENCY DEPARTMENT AT [facility] Provider Note   CSN: [id number] Arrival date & time: 07/18/23  1458     History  Chief Complaint  Patient presents with   Nausea    Pamela Torres is a 58 y.o. female.  58 year old female with history of diabetes and myxofibrosarcoma of the right lower extremity who presents emergency department with right leg pain and nausea.  Patient reports that over the past few weeks developed a lesion on her medial right leg that has started draining and become more painful.  Was started on Keflex  5 days ago but reports still feeling generally ill and is having some nausea and dry heaving now.  No significant abdominal pain except for some cramping after she dry heaves.  Also still having some pain and drainage from her leg.  No fevers or chills.  Has had 3 surgeries on her leg and is currently on oral chemotherapy for her sarcoma.       Home Medications Prior to Admission medications   Medication Sig Start Date End Date Taking? Authorizing Provider  doxycycline (VIBRAMYCIN) 100 MG capsule Take 1 capsule (100 mg total) by mouth 2 (two) times daily. 07/18/23  Yes Ninetta Basket, MD  ondansetron  (ZOFRAN -ODT) 4 MG disintegrating tablet Take 1 tablet (4 mg total) by mouth every 8 (eight) hours as needed for nausea or vomiting. 07/18/23  Yes Ninetta Basket, MD  Cholecalciferol  (VITAMIN D3 PO) Take 1 capsule by mouth daily.    [provider]  gabapentin  (NEURONTIN ) 300 MG capsule Take 300-600 mg by mouth See admin instructions. Take 300 mg by mouth in the morning and afternoon and 600 mg at bedtime 07/03/20   [provider]  glipiZIDE  (GLUCOTROL ) 5 MG tablet Take 1 tablet (5 mg total) by mouth daily for 7 days. 10/01/22 10/08/22  Bradler, Evan K, MD  metFORMIN  (GLUCOPHAGE ) 500 MG tablet Take 500 mg by mouth 2 (two) times daily.    [provider]  sertraline  (ZOLOFT ) 50 MG tablet Take 50 mg by mouth daily. 03/24/20    [provider]  traMADol  (ULTRAM ) 50 MG tablet Take 1 tablet (50 mg total) by mouth every 6 (six) hours as needed. 07/29/21   [employer], MD      Allergies    Simvastatin    Review of Systems   Review of Systems  Physical Exam Updated Vital Signs BP (!) 142/85   Pulse 75   Temp 97.9 F (36.6 C) (Oral)   Resp 16   Ht 5' (1.524 m)   Wt 77.6 kg   SpO2 99%   BMI 33.40 kg/m  Physical Exam Vitals and nursing note reviewed.  Constitutional:      General: She is not in acute distress.    Appearance: She is well-developed.  HENT:     Head: Normocephalic and atraumatic.     Right Ear: External ear normal.     Left Ear: External ear normal.     Nose: Nose normal.  Eyes:     Extraocular Movements: Extraocular movements intact.     Conjunctiva/sclera: Conjunctivae normal.     Pupils: Pupils are equal, round, and reactive to light.  Pulmonary:     Effort: Pulmonary effort is normal.  Abdominal:     General: Abdomen is flat. There is no distension.     Palpations: Abdomen is soft. There is no mass.     Tenderness: There is no abdominal tenderness. There is no  guarding.  Musculoskeletal:     Cervical back: Normal range of motion and neck supple.     Right lower leg: No edema.     Left lower leg: No edema.  Skin:    General: Skin is warm and dry.  Neurological:     Mental Status: She is alert and oriented to person, place, and time. Mental status is at baseline.  Psychiatric:        Mood and Affect: Mood normal.     ED Results / Procedures / Treatments   Labs (all labs ordered are listed, but only abnormal results are displayed) Labs Reviewed  CBC WITH DIFFERENTIAL/PLATELET - Abnormal; Notable for the following components:      Result Value   RDW 17.9 (*)    All other components within normal limits  COMPREHENSIVE METABOLIC PANEL WITH GFR - Abnormal; Notable for the following components:   CO2 20 (*)    Glucose, Bld 101 (*)    Total Protein 6.3 (*)     Albumin  3.2 (*)    AST 61 (*)    ALT 51 (*)    Total Bilirubin 1.4 (*)    All other components within normal limits  LIPASE, BLOOD    EKG None  Radiology DG Tibia/Fibula Right Result Date: 07/18/2023 CLINICAL DATA:  Dizziness. EXAM: RIGHT TIBIA AND FIBULA - 2 VIEW COMPARISON:  November 23, 2020. FINDINGS: There is no evidence of fracture or other focal bone lesions. Soft tissues are unremarkable. IMPRESSION: Negative. Electronically Signed   By: Rosalene Colon M.D.   On: 07/18/2023 17:31    Procedures Procedures    Medications Ordered in ED Medications  ondansetron  (ZOFRAN -ODT) disintegrating tablet 4 mg (4 mg Oral Given 07/18/23 1650)  HYDROmorphone  (DILAUDID ) injection 1 mg (1 mg Intravenous Given 07/18/23 1820)  oxyCODONE  (Oxy IR/ROXICODONE ) immediate release tablet 5 mg (5 mg Oral Given 07/18/23 2322)    ED Course/ Medical Decision Making/ A&P Clinical Course as of 07/19/23 1105  Tue Suarez 20, 2025  2201 Radiology called and states that the patient appears to have recurrence of her sarcoma but no osteomyelitis. Official read will come out tomorrow.  [RP]    Clinical Course User Index [RP] Ninetta Basket, MD                                 Medical Decision Making Amount and/or Complexity of Data Reviewed Labs: ordered. Radiology: ordered.  Risk Prescription drug management.   Pamela Torres is a 58 y.o. female with comorbidities that complicate the patient evaluation including diabetes and myxofibrosarcoma of the right lower extremity who presents emergency department with right leg pain and nausea.    Initial Ddx:  Medication side effect, sepsis, osteomyelitis, cellulitis, abscess, medication withdrawal  MDM/Course:  Patient presents emergency department with nausea and vomiting.  This started after being initiated on Keflex  for a leg infection.  Does have sarcoma underneath the infection as well which Showman be playing a role in her symptoms.  On exam is overall  well-appearing with a wound that does not appear to be significantly draining.  She is not septic currently.  Does appear to be fairly deep so obtain MRI which upon pleural luminary read does not show any evidence of osteomyelitis or abscess.  The patient was changed to doxycycline from her Keflex  to see if she tolerates it better.  Also given some Zofran  to take at  home.  Upon re-evaluation pain was able to be controlled as well as her nausea.  Family did also say that she is on Dilaudid  at home and had not had it in a while and was feeling poorly because of that.  Suspect it could be contributing to her symptoms as well.  This patient presents to the ED for concern of complaints listed in HPI, this involves an extensive number of treatment options, and is a complaint that carries with it a high risk of complications and morbidity. Disposition including potential need for admission considered.   Dispo: DC Home. Return precautions discussed including, but not limited to, those listed in the AVS. Allowed pt time to ask questions which were answered fully prior to dc.  Additional history obtained from daughter Records reviewed Outpatient Clinic Notes The following labs were independently interpreted: CBC and show no acute abnormality I independently reviewed the following imaging with scope of interpretation limited to determining acute life threatening conditions related to emergency care: Extremity x-ray(s) and agree with the radiologist interpretation with the following exceptions: none I personally reviewed and interpreted cardiac monitoring: normal sinus rhythm  I personally reviewed and interpreted the pt's EKG: see above for interpretation  I have reviewed the patients home medications and made adjustments as needed Social Determinants of health:  Geriatric  Portions of this note were generated with Scientist, clinical (histocompatibility and immunogenetics). Dictation errors [place] occur despite best attempts at proofreading.      Final Clinical Impression(s) / ED Diagnoses Final diagnoses:  Nausea  Sarcoma (HCC)    Rx / DC Orders ED Discharge Orders          Ordered    ondansetron  (ZOFRAN -ODT) 4 MG disintegrating tablet  Every 8 hours PRN        07/18/23 2203    doxycycline (VIBRAMYCIN) 100 MG capsule  2 times daily        07/18/23 2204              Ninetta Basket, MD 07/19/23 1105

## 2023-07-18 NOTE — ED Provider Triage Note (Signed)
 Emergency Medicine Provider Triage Evaluation Note  Pamela Torres , a 58 y.o. female  was evaluated in triage.  Pt complains of nausea. Started on keflex  for suspected infection of her leg sarcoma. Since then has had nausea and vomiting. Mild abdominal pain with vomiting.   Review of Systems  Positive: See above Negative: Fevers, chills  Physical Exam  BP (!) 149/86   Pulse 74   Temp 97.9 F (36.6 C) (Oral)   Resp 17   Ht 5' (1.524 m)   Wt 77.6 kg   SpO2 99%   BMI 33.40 kg/m  Gen:   Awake, no distress   Resp:  Normal effort  MSK:   Moves extremities without difficulty  Other:  No significant abdominal ttp, ulceration to medial aspect of RLE but no significant erythema around it  Medical Decision Making  Medically screening exam initiated at 4:29 PM.  Appropriate orders placed.  Pamela Torres was informed that the remainder of the evaluation will be completed by another provider, this initial triage assessment does not replace that evaluation, and the importance of remaining in the ED until their evaluation is complete.   Ninetta Basket, MD 07/18/23 1630

## 2023-09-14 ENCOUNTER — Other Ambulatory Visit: Payer: Self-pay | Admitting: Family Medicine

## 2023-09-14 DIAGNOSIS — Z1231 Encounter for screening mammogram for malignant neoplasm of breast: Secondary | ICD-10-CM

## 2023-12-12 LAB — COLOGUARD: COLOGUARD: NEGATIVE

## 2024-04-03 ENCOUNTER — Other Ambulatory Visit: Payer: Self-pay

## 2024-04-03 ENCOUNTER — Observation Stay

## 2024-04-03 ENCOUNTER — Observation Stay
Admission: EM | Admit: 2024-04-03 | Discharge: 2024-04-05 | Disposition: A | Source: Home / Self Care | Attending: Emergency Medicine | Admitting: Emergency Medicine

## 2024-04-03 ENCOUNTER — Emergency Department

## 2024-04-03 ENCOUNTER — Encounter: Payer: Self-pay | Admitting: *Deleted

## 2024-04-03 DIAGNOSIS — F418 Other specified anxiety disorders: Secondary | ICD-10-CM | POA: Diagnosis not present

## 2024-04-03 DIAGNOSIS — D72829 Elevated white blood cell count, unspecified: Secondary | ICD-10-CM | POA: Diagnosis not present

## 2024-04-03 DIAGNOSIS — E8721 Acute metabolic acidosis: Secondary | ICD-10-CM

## 2024-04-03 DIAGNOSIS — G4733 Obstructive sleep apnea (adult) (pediatric): Secondary | ICD-10-CM

## 2024-04-03 DIAGNOSIS — R9431 Abnormal electrocardiogram [ECG] [EKG]: Secondary | ICD-10-CM | POA: Diagnosis not present

## 2024-04-03 DIAGNOSIS — C499 Malignant neoplasm of connective and soft tissue, unspecified: Secondary | ICD-10-CM | POA: Diagnosis not present

## 2024-04-03 DIAGNOSIS — E872 Acidosis, unspecified: Secondary | ICD-10-CM | POA: Diagnosis present

## 2024-04-03 DIAGNOSIS — D649 Anemia, unspecified: Secondary | ICD-10-CM | POA: Diagnosis present

## 2024-04-03 DIAGNOSIS — D696 Thrombocytopenia, unspecified: Secondary | ICD-10-CM | POA: Diagnosis present

## 2024-04-03 DIAGNOSIS — I5A Non-ischemic myocardial injury (non-traumatic): Secondary | ICD-10-CM | POA: Diagnosis not present

## 2024-04-03 DIAGNOSIS — E119 Type 2 diabetes mellitus without complications: Secondary | ICD-10-CM | POA: Diagnosis not present

## 2024-04-03 DIAGNOSIS — E669 Obesity, unspecified: Secondary | ICD-10-CM | POA: Diagnosis present

## 2024-04-03 DIAGNOSIS — R55 Syncope and collapse: Secondary | ICD-10-CM | POA: Diagnosis present

## 2024-04-03 DIAGNOSIS — I4891 Unspecified atrial fibrillation: Principal | ICD-10-CM

## 2024-04-03 DIAGNOSIS — N390 Urinary tract infection, site not specified: Secondary | ICD-10-CM | POA: Diagnosis present

## 2024-04-03 DIAGNOSIS — L089 Local infection of the skin and subcutaneous tissue, unspecified: Secondary | ICD-10-CM | POA: Diagnosis present

## 2024-04-03 DIAGNOSIS — I10 Essential (primary) hypertension: Secondary | ICD-10-CM | POA: Diagnosis present

## 2024-04-03 LAB — CBC WITH DIFFERENTIAL/PLATELET
Abs Immature Granulocytes: 0.13 10*3/uL — ABNORMAL HIGH (ref 0.00–0.07)
Basophils Absolute: 0.1 10*3/uL (ref 0.0–0.1)
Basophils Relative: 1 %
Eosinophils Absolute: 0 10*3/uL (ref 0.0–0.5)
Eosinophils Relative: 0 %
HCT: 31.5 % — ABNORMAL LOW (ref 36.0–46.0)
Hemoglobin: 10.7 g/dL — ABNORMAL LOW (ref 12.0–15.0)
Immature Granulocytes: 1 %
Lymphocytes Relative: 28 %
Lymphs Abs: 3.2 10*3/uL (ref 0.7–4.0)
MCH: 32.3 pg (ref 26.0–34.0)
MCHC: 34 g/dL (ref 30.0–36.0)
MCV: 95.2 fL (ref 80.0–100.0)
Monocytes Absolute: 0.4 10*3/uL (ref 0.1–1.0)
Monocytes Relative: 4 %
Neutro Abs: 7.8 10*3/uL — ABNORMAL HIGH (ref 1.7–7.7)
Neutrophils Relative %: 66 %
Platelets: 125 10*3/uL — ABNORMAL LOW (ref 150–400)
RBC: 3.31 MIL/uL — ABNORMAL LOW (ref 3.87–5.11)
RDW: 18.7 % — ABNORMAL HIGH (ref 11.5–15.5)
WBC: 11.6 10*3/uL — ABNORMAL HIGH (ref 4.0–10.5)
nRBC: 0 % (ref 0.0–0.2)

## 2024-04-03 LAB — URINALYSIS, W/ REFLEX TO CULTURE (INFECTION SUSPECTED)
Bilirubin Urine: NEGATIVE
Glucose, UA: 150 mg/dL — AB
Hgb urine dipstick: NEGATIVE
Ketones, ur: NEGATIVE mg/dL
Nitrite: NEGATIVE
Protein, ur: NEGATIVE mg/dL
Specific Gravity, Urine: 1.023 (ref 1.005–1.030)
pH: 5 (ref 5.0–8.0)

## 2024-04-03 LAB — COMPREHENSIVE METABOLIC PANEL WITH GFR
ALT: 57 U/L — ABNORMAL HIGH (ref 0–44)
AST: 27 U/L (ref 15–41)
Albumin: 4 g/dL (ref 3.5–5.0)
Alkaline Phosphatase: 117 U/L (ref 38–126)
Anion gap: 23 — ABNORMAL HIGH (ref 5–15)
BUN: 26 mg/dL — ABNORMAL HIGH (ref 6–20)
CO2: 16 mmol/L — ABNORMAL LOW (ref 22–32)
Calcium: 9.3 mg/dL (ref 8.9–10.3)
Chloride: 94 mmol/L — ABNORMAL LOW (ref 98–111)
Creatinine, Ser: 1.01 mg/dL — ABNORMAL HIGH (ref 0.44–1.00)
GFR, Estimated: >60 mL/min
Glucose, Bld: 295 mg/dL — ABNORMAL HIGH (ref 70–99)
Potassium: 4.2 mmol/L (ref 3.5–5.1)
Sodium: 133 mmol/L — ABNORMAL LOW (ref 135–145)
Total Bilirubin: 0.3 mg/dL (ref 0.0–1.2)
Total Protein: 6.6 g/dL (ref 6.5–8.1)

## 2024-04-03 LAB — CBG MONITORING, ED
Glucose-Capillary: 350 mg/dL — ABNORMAL HIGH (ref 70–99)
Glucose-Capillary: 369 mg/dL — ABNORMAL HIGH (ref 70–99)

## 2024-04-03 LAB — LACTIC ACID, PLASMA
Lactic Acid, Venous: 7.5 mmol/L (ref 0.5–1.9)
Lactic Acid, Venous: 5.9 mmol/L (ref 0.5–1.9)
Lactic Acid, Venous: 7.1 mmol/L (ref 0.5–1.9)

## 2024-04-03 LAB — PROTIME-INR
INR: 1 (ref 0.8–1.2)
Prothrombin Time: 13.3 s (ref 11.4–15.2)

## 2024-04-03 LAB — RESP PANEL BY RT-PCR (RSV, FLU A&B, COVID)  RVPGX2
Influenza A by PCR: NEGATIVE
Influenza B by PCR: NEGATIVE
Resp Syncytial Virus by PCR: NEGATIVE
SARS Coronavirus 2 by RT PCR: NEGATIVE

## 2024-04-03 LAB — TROPONIN T, HIGH SENSITIVITY
Troponin T High Sensitivity: 55 ng/L — ABNORMAL HIGH (ref 0–19)
Troponin T High Sensitivity: 52 ng/L — ABNORMAL HIGH (ref 0–19)

## 2024-04-03 LAB — PHOSPHORUS: Phosphorus: 3.1 mg/dL (ref 2.5–4.6)

## 2024-04-03 LAB — MAGNESIUM: Magnesium: 1.8 mg/dL (ref 1.7–2.4)

## 2024-04-03 LAB — PROCALCITONIN: Procalcitonin: 0.27 ng/mL

## 2024-04-03 LAB — TSH: TSH: 3.64 u[IU]/mL (ref 0.350–4.500)

## 2024-04-03 LAB — APTT: aPTT: <22 s — ABNORMAL LOW (ref 24–36)

## 2024-04-03 LAB — T4, FREE: Free T4: 1.17 ng/dL (ref 0.80–2.00)

## 2024-04-03 MED ORDER — ONDANSETRON HCL 4 MG/2ML IJ SOLN
4.0000 mg | Freq: Three times a day (TID) | INTRAMUSCULAR | Status: DC | PRN
  Administered 2024-04-05: 4 mg via INTRAVENOUS
  Filled 2024-04-03: qty 2

## 2024-04-03 MED ORDER — DULOXETINE HCL 60 MG PO CPEP
60.0000 mg | ORAL_CAPSULE | Freq: Every day | ORAL | Status: DC
  Administered 2024-04-04 – 2024-04-05 (×2): 60 mg via ORAL
  Filled 2024-04-03: qty 1
  Filled 2024-04-03: qty 2

## 2024-04-03 MED ORDER — MORPHINE SULFATE (PF) 2 MG/ML IV SOLN
2.0000 mg | Freq: Once | INTRAVENOUS | Status: AC
  Administered 2024-04-03: 2 mg via INTRAVENOUS
  Filled 2024-04-03: qty 1

## 2024-04-03 MED ORDER — HEPARIN SODIUM (PORCINE) 5000 UNIT/ML IJ SOLN
5000.0000 [IU] | Freq: Three times a day (TID) | INTRAMUSCULAR | Status: DC
  Administered 2024-04-03 – 2024-04-04 (×2): 5000 [IU] via SUBCUTANEOUS
  Filled 2024-04-03 (×2): qty 1

## 2024-04-03 MED ORDER — HYDROXYZINE HCL 25 MG PO TABS: 25.0000 mg | ORAL_TABLET | Freq: Three times a day (TID) | ORAL | Status: DC | PRN

## 2024-04-03 MED ORDER — SODIUM CHLORIDE 0.9 % IV BOLUS (SEPSIS)
500.0000 mL | Freq: Once | INTRAVENOUS | Status: AC
  Administered 2024-04-03: 500 mL via INTRAVENOUS

## 2024-04-03 MED ORDER — METRONIDAZOLE 500 MG/100ML IV SOLN
500.0000 mg | Freq: Once | INTRAVENOUS | Status: AC
  Administered 2024-04-03: 500 mg via INTRAVENOUS
  Filled 2024-04-03: qty 100

## 2024-04-03 MED ORDER — ICOSAPENT ETHYL 1 G PO CAPS
2.0000 g | ORAL_CAPSULE | Freq: Two times a day (BID) | ORAL | Status: DC
  Administered 2024-04-04 – 2024-04-05 (×3): 2 g via ORAL
  Filled 2024-04-03 (×5): qty 2

## 2024-04-03 MED ORDER — SODIUM CHLORIDE 0.9 % IV SOLN
2.0000 g | INTRAVENOUS | Status: DC
  Administered 2024-04-03 – 2024-04-04 (×2): 2 g via INTRAVENOUS
  Filled 2024-04-03 (×3): qty 20

## 2024-04-03 MED ORDER — GABAPENTIN 300 MG PO CAPS
600.0000 mg | ORAL_CAPSULE | Freq: Every day | ORAL | Status: DC
  Administered 2024-04-03 – 2024-04-04 (×2): 600 mg via ORAL
  Filled 2024-04-03 (×2): qty 2

## 2024-04-03 MED ORDER — ALLOPURINOL 100 MG PO TABS
100.0000 mg | ORAL_TABLET | Freq: Every day | ORAL | Status: DC
  Administered 2024-04-04 – 2024-04-05 (×2): 100 mg via ORAL
  Filled 2024-04-03 (×3): qty 1

## 2024-04-03 MED ORDER — SODIUM CHLORIDE 0.9 % IV BOLUS (SEPSIS)
1000.0000 mL | Freq: Once | INTRAVENOUS | Status: AC
  Administered 2024-04-03: 1000 mL via INTRAVENOUS

## 2024-04-03 MED ORDER — INSULIN ASPART 100 UNIT/ML IJ SOLN
0.0000 [IU] | Freq: Every day | INTRAMUSCULAR | Status: DC
  Administered 2024-04-03: 5 [IU] via SUBCUTANEOUS
  Administered 2024-04-04: 2 [IU] via SUBCUTANEOUS
  Filled 2024-04-03: qty 5
  Filled 2024-04-03: qty 2

## 2024-04-03 MED ORDER — GABAPENTIN 300 MG PO CAPS: 300.0000 mg | ORAL_CAPSULE | ORAL | Status: DC

## 2024-04-03 MED ORDER — ACETAMINOPHEN 325 MG PO TABS
650.0000 mg | ORAL_TABLET | Freq: Four times a day (QID) | ORAL | Status: DC | PRN
  Administered 2024-04-03: 650 mg via ORAL
  Filled 2024-04-03: qty 2

## 2024-04-03 MED ORDER — SODIUM CHLORIDE 0.9 % IV SOLN: INTRAVENOUS | Status: DC

## 2024-04-03 MED ORDER — GABAPENTIN 300 MG PO CAPS
300.0000 mg | ORAL_CAPSULE | Freq: Every day | ORAL | Status: DC
  Administered 2024-04-04 – 2024-04-05 (×2): 300 mg via ORAL
  Filled 2024-04-03 (×2): qty 1

## 2024-04-03 MED ORDER — ACETAMINOPHEN 500 MG PO TABS
1000.0000 mg | ORAL_TABLET | Freq: Once | ORAL | Status: AC
  Administered 2024-04-03: 1000 mg via ORAL
  Filled 2024-04-03: qty 2

## 2024-04-03 MED ORDER — VANCOMYCIN HCL IN DEXTROSE 1-5 GM/200ML-% IV SOLN
1000.0000 mg | Freq: Once | INTRAVENOUS | Status: AC
  Administered 2024-04-03: 1000 mg via INTRAVENOUS
  Filled 2024-04-03: qty 200

## 2024-04-03 MED ORDER — SODIUM CHLORIDE 0.9 % IV SOLN
2.0000 g | Freq: Once | INTRAVENOUS | Status: AC
  Administered 2024-04-03: 2 g via INTRAVENOUS
  Filled 2024-04-03: qty 12.5

## 2024-04-03 MED ORDER — HYDROMORPHONE HCL 2 MG PO TABS: 4.0000 mg | ORAL_TABLET | ORAL | Status: DC | PRN

## 2024-04-03 MED ORDER — OLANZAPINE 5 MG PO TABS
2.5000 mg | ORAL_TABLET | Freq: Every day | ORAL | Status: DC
  Administered 2024-04-03 – 2024-04-04 (×2): 2.5 mg via ORAL
  Filled 2024-04-03 (×3): qty 1

## 2024-04-03 MED ORDER — INSULIN ASPART 100 UNIT/ML IJ SOLN
0.0000 [IU] | Freq: Three times a day (TID) | INTRAMUSCULAR | Status: DC
  Administered 2024-04-04: 2 [IU] via SUBCUTANEOUS
  Administered 2024-04-04: 3 [IU] via SUBCUTANEOUS
  Administered 2024-04-04: 7 [IU] via SUBCUTANEOUS
  Administered 2024-04-05: 5 [IU] via SUBCUTANEOUS
  Administered 2024-04-05: 3 [IU] via SUBCUTANEOUS
  Filled 2024-04-03 (×2): qty 5
  Filled 2024-04-03: qty 7
  Filled 2024-04-03: qty 3

## 2024-04-03 MED ORDER — LACTATED RINGERS IV BOLUS
1000.0000 mL | Freq: Once | INTRAVENOUS | Status: AC
  Administered 2024-04-03: 1000 mL via INTRAVENOUS

## 2024-04-03 NOTE — ED Triage Notes (Signed)
 Pt to triage via wheelchair.  Pt reports 3 syncopal episodes since yesterday.  Pt denies chest pain, sob, dizziness.  Pt is on chemo.  Last treatment last week. Pt treated at [facility].  Pt alert.

## 2024-04-03 NOTE — ED Notes (Signed)
 Pt to room 10 from triage.

## 2024-04-03 NOTE — Progress Notes (Signed)
 Patient ordered on CPAP at night. Patient has OSA but hasn't worn a CPAP in many years.

## 2024-04-03 NOTE — ED Notes (Signed)
 Pt ambulated to and from the toilet w/o any assistance at this time

## 2024-04-03 NOTE — ED Provider Notes (Signed)
 "  [facility] Provider Note    Event Date/Time   First MD Initiated Contact with Patient 04/03/24 1527     (approximate)   History   Chief Complaint: Near Syncope   HPI  Pamela Torres is a 59 y.o. female with a history of diabetes, myxofibrosarcoma of the right leg with recent chemotherapy who comes ED complaining of generalized weakness starting 2 days ago, passing out 3 times from standing yesterday.  Denies chest pain or shortness of breath but just feels weak        Past Medical History:  Diagnosis Date   Anemia during pregnancy    Anxiety    Chronic back pain    Cyst in hand    on left hand   DVT (deep vein thrombosis) in pregnancy    right leg   Gout    History of kidney stones    Hx of migraines    OSA on CPAP    PAC (premature atrial contraction) 2018   Sarcoma (HCC)    Sciatic pain    T2DM (type 2 diabetes mellitus) [facility])    Wears glasses     Current Outpatient Rx   Order #: [id number] Class: Historical Med   Order #: [id number] Class: Normal   Order #: [id number] Class: Historical Med   Order #: [id number] Class: Normal   Order #: [id number] Class: Historical Med   Order #: [id number] Class: Normal   Order #: [id number] Class: Historical Med   Order #: [id number] Class: Normal    Past Surgical History:  Procedure Laterality Date   ABDOMINAL HYSTERECTOMY     APPLICATION OF WOUND VAC Right 07/29/2021   Procedure: APPLICATION OF WOUND VAC;  Surgeon: Marea Selinda RAMAN, MD;  Location: [facility] ORS;  Service: Vascular;  Laterality: Right;   BACK SURGERY  2014   microdiskectomy - lumbar   CARDIAC CATHETERIZATION     01/19/11 [facility]): normal cononary anatomy, EF 64%   CESAREAN SECTION     INCISION AND DRAINAGE ABSCESS Right 07/29/2021   Procedure: INCISION AND DRAINAGE ABSCESS;  Surgeon: Marea Selinda RAMAN, MD;  Location: [facility] ORS;  Service: Vascular;  Laterality: Right;   KNEE SURGERY Right    arthroscopy   LUMBAR LAMINECTOMY/DECOMPRESSION MICRODISCECTOMY  Right 12/10/2013   Procedure: Right Lumbar Five to Sacral One Redo Microdiskectomy;  Surgeon: Fairy Levels, MD;  Location: [facility];  Service: Neurosurgery;  Laterality: Right;  Right L5-S1 Redo Microdiskectomy   MAXIMUM ACCESS (MAS)POSTERIOR LUMBAR INTERBODY FUSION (PLIF) 1 LEVEL N/A 06/26/2014   Procedure: Lumbar five sacral one Maximum Access Posterior Lumbar Interbody Fusion;  Surgeon: Fairy Levels, MD;  Location: [facility];  Service: Neurosurgery;  Laterality: N/A;   ROTATOR CUFF REPAIR Right    TRANSFORAMINAL LUMBAR INTERBODY FUSION (TLIF) WITH PEDICLE SCREW FIXATION 1 LEVEL N/A 07/28/2020   Procedure: Lumbar Four-Five Transforaminal lumbar interbody fusion with exploration of Lumbar Five- Sacral One Fusion;  Surgeon: Levels Fairy, MD;  Location: [facility] OR;  Service: Neurosurgery;  Laterality: N/A;  Lumbar Four-Five Transforaminal lumbar interbody fusion with exploration of Lumbar Five- Sacral One Fusion    Physical Exam   Triage Vital Signs: ED Triage Vitals  Encounter Vitals Group     BP 04/03/24 1520 125/84     Girls Systolic BP Percentile --      Girls Diastolic BP Percentile --      Boys Systolic BP Percentile --      Boys Diastolic BP Percentile --      Pulse Rate 04/03/24 1520  73     Resp 04/03/24 1520 20     Temp 04/03/24 1520 98.1 F (36.7 C)     Temp Source 04/03/24 1520 Oral     SpO2 04/03/24 1520 97 %     Weight 04/03/24 1521 189 lb (85.7 kg)     Height 04/03/24 1521 5' 1 (1.549 m)     Head Circumference --      Peak Flow --      Pain Score 04/03/24 1521 5     Pain Loc --      Pain Education --      Exclude from Growth Chart --     Most recent vital signs: Vitals:   04/03/24 1745 04/03/24 1830  BP: 107/68 (!) 109/56  Pulse: (!) 102 (!) 105  Resp: 13 11  Temp:    SpO2: 100% 97%    General: Awake, no distress.  CV:  Good peripheral perfusion.  Irregular rhythm, heart rate 150.  Symmetric distal pulses Resp:  Normal effort.  Clear lungs Abd:  No  distention.  Soft nontender Other:  No lower extremity edema   ED Results / Procedures / Treatments   Labs (all labs ordered are listed, but only abnormal results are displayed) Labs Reviewed  LACTIC ACID, PLASMA - Abnormal; Notable for the following components:      Result Value   Lactic Acid, Venous 7.5 (*)    All other components within normal limits  LACTIC ACID, PLASMA - Abnormal; Notable for the following components:   Lactic Acid, Venous 5.9 (*)    All other components within normal limits  COMPREHENSIVE METABOLIC PANEL WITH GFR - Abnormal; Notable for the following components:   Sodium 133 (*)    Chloride 94 (*)    CO2 16 (*)    Glucose, Bld 295 (*)    BUN 26 (*)    Creatinine, Ser 1.01 (*)    ALT 57 (*)    Anion gap 23 (*)    All other components within normal limits  CBC WITH DIFFERENTIAL/PLATELET - Abnormal; Notable for the following components:   WBC 11.6 (*)    RBC 3.31 (*)    Hemoglobin 10.7 (*)    HCT 31.5 (*)    RDW 18.7 (*)    Platelets 125 (*)    Neutro Abs 7.8 (*)    Abs Immature Granulocytes 0.13 (*)    All other components within normal limits  TROPONIN T, HIGH SENSITIVITY - Abnormal; Notable for the following components:   Troponin T High Sensitivity 55 (*)    All other components within normal limits  RESP PANEL BY RT-PCR (RSV, FLU A&B, COVID)  RVPGX2  CULTURE, BLOOD (ROUTINE X 2)  CULTURE, BLOOD (ROUTINE X 2)  PROTIME-INR  MAGNESIUM  PHOSPHORUS  URINALYSIS, W/ REFLEX TO CULTURE (INFECTION SUSPECTED)  TROPONIN T, HIGH SENSITIVITY     EKG Interpreted by me Atrial fibrillation, tachycardia heart rate 153.  Rightward axis, left bundle branch block, no acute ischemic changes.   RADIOLOGY Chest x-ray interpreted by me, unremarkable.  Radiology report reviewed   PROCEDURES:  .Critical Care  Performed by: Viviann Pastor, MD Authorized by: Viviann Pastor, MD   Critical care provider statement:    Critical care time (minutes):   35   Critical care time was exclusive of:  Separately billable procedures and treating other patients   Critical care was necessary to treat or prevent imminent or life-threatening deterioration of the following conditions:  Sepsis, cardiac failure, circulatory  failure and metabolic crisis   Critical care was time spent personally by me on the following activities:  Development of treatment plan with patient or surrogate, discussions with consultants, evaluation of patient's response to treatment, examination of patient, obtaining history from patient or surrogate, ordering and performing treatments and interventions, ordering and review of laboratory studies, ordering and review of radiographic studies, pulse oximetry, re-evaluation of patient's condition and review of old charts   Care discussed with: admitting provider      MEDICATIONS ORDERED IN ED: Medications  sodium chloride  0.9 % bolus 1,000 mL (0 mLs Intravenous Stopped 04/03/24 1619)  acetaminophen  (TYLENOL ) tablet 1,000 mg (1,000 mg Oral Given 04/03/24 1647)  morphine  (PF) 2 MG/ML injection 2 mg (2 mg Intravenous Given 04/03/24 1643)  sodium chloride  0.9 % bolus 500 mL (0 mLs Intravenous Stopped 04/03/24 1742)  ceFEPIme  (MAXIPIME ) 2 g in sodium chloride  0.9 % 100 mL IVPB (0 g Intravenous Stopped 04/03/24 1713)  metroNIDAZOLE  (FLAGYL ) IVPB 500 mg (0 mg Intravenous Stopped 04/03/24 1842)  vancomycin  (VANCOCIN ) IVPB 1000 mg/200 mL premix (1,000 mg Intravenous New Bag/Given 04/03/24 1843)  lactated ringers  bolus 1,000 mL (1,000 mLs Intravenous New Bag/Given 04/03/24 1854)     IMPRESSION / MDM / ASSESSMENT AND PLAN / ED COURSE  I reviewed the triage vital signs and the nursing notes.  DDx: Idiopathic atrial fibrillation, non-STEMI, electrolyte derangement, AKI, sepsis, dehydration  Patient's presentation is most consistent with acute presentation with potential threat to life or bodily function.  Patient presents with generalized weakness, new  onset atrial fibrillation with tachycardia.  Will give fluid bolus for hydration, check labs, start infectious workup due to possible sepsis though patient is not clearly septic on arrival.   Clinical Course as of 04/03/24 2002  Wed Apr 03, 2024  1633 Lactic Acid, Venous(!!): 7.5 Markedly elevated lactate, will continue IV fluids by ideal body weight, start empiric antibiotics pending infectious workup [PS]  1742 Repeat EKG demonstrates conversion to sinus tachycardia without antiarrhythmic medications [PS]    Clinical Course User Index [PS] Viviann Pastor, MD    ----------------------------------------- 8:02 PM on 04/03/2024 ----------------------------------------- Repeat lactate improved to 5.9.  Case discussed with hospitalist for further management.   FINAL CLINICAL IMPRESSION(S) / ED DIAGNOSES   Final diagnoses:  New onset atrial fibrillation (HCC)  Acute lactic acidosis     Rx / DC Orders   ED Discharge Orders     None        Note:  This document was prepared using Dragon voice recognition software and [place] include unintentional dictation errors.   Viviann Pastor, MD 04/03/24 2002  "

## 2024-04-03 NOTE — ED Notes (Signed)
 CRITICAL VALUE STICKER  CRITICAL VALUE:Lactic acid 7.5  RECEIVER (on-site recipient of call):I. Renato Spellman  DATE & TIME NOTIFIED: 04/03/24 1618  MESSENGER (representative from lab):Lab  MD NOTIFIED: Viviann  TIME OF NOTIFICATION:1630  RESPONSE:

## 2024-04-03 NOTE — H&P (Incomplete)
 " History and Physical    Pamela Torres FMW:[id number] DOB: [birth year] DOA: 04/03/2024  Referring MD/NP/PA:   PCP: Halbert Mariano SQUIBB, DO   Patient coming from:  The patient is coming from home.     Chief Complaint: syncope  HPI: Pamela Torres is a 59 y.o. 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  Pt states that she has palpitation and feeling heart racing for almost a week.  No chest pain, cough, SOB. Per her daughter at the bedside, patient passed out 3 times since yesterday when she was walking at home.  Denies unilateral numbness or tingling in the extremities.  Patient has chronic right leg weakness due to sarcoma which has not changed.  No facial droop or slurred speech.  Denies dizziness.  Patient's nausea, no vomiting, diarrhea or abdominal pain.  Denies symptoms UTI.  Pt has a small wound with pus drainage in the medial side of right leg, close to knee where the sarcoma tumor is located.   Pt was found to have tachycardia with heart rate up to 150s. EKG seems to have atrial flutter with variable conduction vs. A fib. It is converted to sinus rhythm with IV fluid in ED.  Data reviewed independently and ED Course: pt was found to have WBC 11.6, lactic acid 7.5 --> 5.9 --> 7.1, bicarbonate 16, negative PCR for COVID, flu and RSV, GFR> 60, troponin 55, positive UA (hazy appearance, large amount of leukocyte, rare bacteria, WBC 21-50), normal TSH 3.6-40 and normal T4 of 1.17.  Temperature normal, blood pressure 109/56, RR 21 --> 11, oxygen saturation 97% on room air.  Chest x-ray negative.  CT of head negative for acute intracranial abnormalities. Pt is placed in PCU for obs.  Epic message sent to [facility] pool for [facility] card consult.  CT of head: Pending   EKG: I have personally  reviewed.  The first EKG seems to have atrial flutter with variable conduction vs. A fib, heart rate 153, low voltage, QTc 477.  The repeated EKG showed sinus rhythm with heart rate of 105 after giving IVF in ED.   Review of Systems:   General: no fevers, chills, no body weight gain, has fatigue HEENT: no blurry vision, hearing changes or sore throat Respiratory: no dyspnea, coughing, wheezing CV: no chest pain, has palpitations GI: no nausea, vomiting, abdominal pain, diarrhea, constipation GU: no dysuria, burning on urination, increased urinary frequency, hematuria  Ext: no leg edema Neuro: no numbness, or tingling, no vision change or hearing loss. Has chronic right leg weakness due to sarcoma.  Has syncope. Skin: Has sarcoma tumor in the right leg, has a small wound in the right leg MSK: No muscle spasm, no deformity, no limitation of range of movement in spin Heme: No easy bruising.  Travel history: No recent long distant travel.   Allergy: Allergies[1]  Past Medical History:  Diagnosis Date   Anemia during pregnancy    Anxiety    Chronic back pain    Cyst in hand    on left hand   DVT (deep vein thrombosis) in pregnancy    right leg   Gout    History of kidney stones    Hx of migraines    OSA on CPAP    PAC (premature atrial contraction) 2018  Sarcoma (HCC)    Sciatic pain    T2DM (type 2 diabetes mellitus) (HCC)    Wears glasses     Past Surgical History:  Procedure Laterality Date   ABDOMINAL HYSTERECTOMY     APPLICATION OF WOUND VAC Right 07/29/2021   Procedure: APPLICATION OF WOUND VAC;  Surgeon: Marea Selinda RAMAN, MD;  Location: [facility] ORS;  Service: Vascular;  Laterality: Right;   BACK SURGERY  2014   microdiskectomy - lumbar   CARDIAC CATHETERIZATION     01/19/11 [facility]): normal cononary anatomy, EF 64%   CESAREAN SECTION     INCISION AND DRAINAGE ABSCESS Right 07/29/2021   Procedure: INCISION AND DRAINAGE ABSCESS;  Surgeon: Marea Selinda RAMAN, MD;  Location: [facility] ORS;   Service: Vascular;  Laterality: Right;   KNEE SURGERY Right    arthroscopy   LUMBAR LAMINECTOMY/DECOMPRESSION MICRODISCECTOMY Right 12/10/2013   Procedure: Right Lumbar Five to Sacral One Redo Microdiskectomy;  Surgeon: Fairy Levels, MD;  Location: [facility] OR[facility];  Service: Neurosurgery;  Laterality: Right;  Right L5-S1 Redo Microdiskectomy   MAXIMUM ACCESS (MAS)POSTERIOR LUMBAR INTERBODY FUSION (PLIF) 1 LEVEL N/A 06/26/2014   Procedure: Lumbar five sacral one Maximum Access Posterior Lumbar Interbody Fusion;  Surgeon: Fairy Levels, MD;  Location: [facility]UR[facility] OR[facility];  Service: Neurosurgery;  Laterality: N/A;   ROTATOR CUFF REPAIR Right    TRANSFORAMINAL LUMBAR INTERBODY FUSION (TLIF) WITH PEDICLE SCREW FIXATION 1 LEVEL N/A 07/28/2020   Procedure: Lumbar Four-Five Transforaminal lumbar interbody fusion with exploration of Lumbar Five- Sacral One Fusion;  Surgeon: Levels Fairy, MD;  Location: [facility] OR;  Service: Neurosurgery;  Laterality: N/A;  Lumbar Four-Five Transforaminal lumbar interbody fusion with exploration of Lumbar Five- Sacral One Fusion    Social History:  reports that she has never smoked. She has never used smokeless tobacco. She reports that she does not drink alcohol and does not use drugs.  Family History:  Family History  Problem Relation Age of Onset   Thyroid disease Mother    COPD Mother    Emphysema Mother    Diabetes Father    Heart attack Father    Leukemia Father    Hypertension Brother    Diabetes Brother    Diabetes Brother    Hypertension Brother    Diabetes Brother    Breast cancer Maternal Aunt      Prior to Admission medications  Medication Sig Start Date End Date Taking? Authorizing Provider  Cholecalciferol  (VITAMIN D3 PO) Take 1 capsule by mouth daily.    [provider]  doxycycline  (VIBRAMYCIN ) 100 MG capsule Take 1 capsule (100 mg total) by mouth 2 (two) times daily. 07/18/23   Yolande Lamar BROCKS, MD  gabapentin  (NEURONTIN ) 300 MG capsule Take  300-600 mg by mouth See admin instructions. Take 300 mg by mouth in the morning and afternoon and 600 mg at bedtime 07/03/20   [provider]  glipiZIDE  (GLUCOTROL ) 5 MG tablet Take 1 tablet (5 mg total) by mouth daily for 7 days. 10/01/22 10/08/22  Bradler, Evan K, MD  metFORMIN  (GLUCOPHAGE ) 500 MG tablet Take 500 mg by mouth 2 (two) times daily.    [provider]  ondansetron  (ZOFRAN -ODT) 4 MG disintegrating tablet Take 1 tablet (4 mg total) by mouth every 8 (eight) hours as needed for nausea or vomiting. 07/18/23   Yolande Lamar BROCKS, MD  sertraline  (ZOLOFT ) 50 MG tablet Take 50 mg by mouth daily. 03/24/20   [provider]  traMADol  (ULTRAM ) 50 MG tablet Take 1 tablet (  50 mg total) by mouth every 6 (six) hours as needed. 07/29/21   Marea Selinda RAMAN, MD    Physical Exam: Vitals:   04/03/24 2130 04/03/24 2230 04/03/24 2300 04/04/24 0000  BP: (!) 118/48 (!) 147/53 (!) 140/57 (!) 124/48  Pulse: 78 (!) 109 (!) 108 (!) 108  Resp: 16 18 12 15   Temp:      TempSrc:      SpO2: 94% 96% 97% 97%  Weight:      Height:       General: Not in acute distress HEENT:       Eyes: PERRL, EOMI, no jaundice       ENT: No discharge from the ears and nose, no pharynx injection, no tonsillar enlargement.        Neck: No JVD, no bruit, no mass felt. Heme: No neck lymph node enlargement. Cardiac: S1/S2, irregular rhythm, no gallops or rubs. Respiratory: No rales, wheezing, rhonchi or rubs. GI: Soft, nondistended, nontender, no rebound pain, no organomegaly, BS present. GU: No hematuria Ext: No pitting leg edema bilaterally. 1+DP/PT pulse bilaterally. Musculoskeletal: No joint deformities, No joint redness or warmth, no limitation of ROM in spin. Skin:  Pt has a small wound with pus drainage in the medial side of right leg, close to knee where the sarcoma tumor is located.      Neuro: Alert, oriented X3, cranial nerves II-XII grossly intact, moves all extremities  Psych: Patient is not  psychotic, no suicidal or hemocidal ideation.  Labs on Admission: I have personally reviewed following labs and imaging studies  CBC: Recent Labs  Lab 04/03/24 1535  WBC 11.6*  NEUTROABS 7.8*  HGB 10.7*  HCT 31.5*  MCV 95.2  PLT 125*   Basic Metabolic Panel: Recent Labs  Lab 04/03/24 1535  NA 133*  K 4.2  CL 94*  CO2 16*  GLUCOSE 295*  BUN 26*  CREATININE 1.01*  CALCIUM 9.3  MG 1.8  PHOS 3.1   GFR: Estimated Creatinine Clearance: 58.9 mL/min (A) (by C-G formula based on SCr of 1.01 mg/dL (H)). Liver Function Tests: Recent Labs  Lab 04/03/24 1535  AST 27  ALT 57*  ALKPHOS 117  BILITOT 0.3  PROT 6.6  ALBUMIN  4.0   No results for input(s): LIPASE, AMYLASE in the last 168 hours. No results for input(s): AMMONIA in the last 168 hours. Coagulation Profile: Recent Labs  Lab 04/03/24 1535  INR 1.0   Cardiac Enzymes: No results for input(s): CKTOTAL, CKMB, CKMBINDEX, TROPONINI in the last 168 hours. BNP (last 3 results) No results for input(s): PROBNP in the last 8760 hours. HbA1C: No results for input(s): HGBA1C in the last 72 hours. CBG: Recent Labs  Lab 04/03/24 2209 04/03/24 2229  GLUCAP 350* 369*   Lipid Profile: No results for input(s): CHOL, HDL, LDLCALC, TRIG, CHOLHDL, LDLDIRECT in the last 72 hours. Thyroid Function Tests: Recent Labs    04/03/24 2119  TSH 3.640  FREET4 1.17   Anemia Panel: No results for input(s): VITAMINB12, FOLATE, FERRITIN, TIBC, IRON, RETICCTPCT in the last 72 hours. Urine analysis:    Component Value Date/Time   COLORURINE YELLOW (A) 04/03/2024 2119   APPEARANCEUR HAZY (A) 04/03/2024 2119   LABSPEC 1.023 04/03/2024 2119   PHURINE 5.0 04/03/2024 2119   GLUCOSEU 150 (A) 04/03/2024 2119   HGBUR NEGATIVE 04/03/2024 2119   BILIRUBINUR NEGATIVE 04/03/2024 2119   KETONESUR NEGATIVE 04/03/2024 2119   PROTEINUR NEGATIVE 04/03/2024 2119   NITRITE NEGATIVE 04/03/2024 2119  LEUKOCYTESUR LARGE (A) 04/03/2024 2119   Sepsis Labs: @LABRCNTIP (procalcitonin:4,lacticidven:4) ) Recent Results (from the past 240 hours)  Resp panel by RT-PCR (RSV, Flu A&B, Covid) Anterior Nasal Swab     Status: None   Collection Time: 04/03/24  4:35 PM   Specimen: Anterior Nasal Swab  Result Value Ref Range Status   SARS Coronavirus 2 by RT PCR NEGATIVE NEGATIVE Final    Comment: (NOTE) SARS-CoV-2 target nucleic acids are NOT DETECTED.  The SARS-CoV-2 RNA is generally detectable in upper respiratory specimens during the acute phase of infection. The lowest concentration of SARS-CoV-2 viral copies this assay can detect is 138 copies/mL. A negative result does not preclude SARS-Cov-2 infection and should not be used as the sole basis for treatment or other patient management decisions. A negative result Arentz occur with  improper specimen collection/handling, submission of specimen other than nasopharyngeal swab, presence of viral mutation(s) within the areas targeted by this assay, and inadequate number of viral copies(<138 copies/mL). A negative result must be combined with clinical observations, patient history, and epidemiological information. The expected result is Negative.  Fact Sheet for Patients:  bloggercourse.com  Fact Sheet for Healthcare Providers:  seriousbroker.it  This test is no t yet approved or cleared by the [place]  FDA and  has been authorized for detection and/or diagnosis of SARS-CoV-2 by FDA under an Emergency Use Authorization (EUA). This EUA will remain  in effect (meaning this test can be used) for the duration of the COVID-19 declaration under Section 564(b)(1) of the Act, 21 U.S.C.section 360bbb-3(b)(1), unless the authorization is terminated  or revoked sooner.       Influenza A by PCR NEGATIVE NEGATIVE Final   Influenza B by PCR NEGATIVE NEGATIVE Final    Comment: (NOTE) The Xpert  Xpress SARS-CoV-2/FLU/RSV plus assay is intended as an aid in the diagnosis of influenza from Nasopharyngeal swab specimens and should not be used as a sole basis for treatment. Nasal washings and aspirates are unacceptable for Xpert Xpress SARS-CoV-2/FLU/RSV testing.  Fact Sheet for Patients: bloggercourse.com  Fact Sheet for Healthcare Providers: seriousbroker.it  This test is not yet approved or cleared by the [place]  FDA and has been authorized for detection and/or diagnosis of SARS-CoV-2 by FDA under an Emergency Use Authorization (EUA). This EUA will remain in effect (meaning this test can be used) for the duration of the COVID-19 declaration under Section 564(b)(1) of the Act, 21 U.S.C. section 360bbb-3(b)(1), unless the authorization is terminated or revoked.     Resp Syncytial Virus by PCR NEGATIVE NEGATIVE Final    Comment: (NOTE) Fact Sheet for Patients: bloggercourse.com  Fact Sheet for Healthcare Providers: seriousbroker.it  This test is not yet approved or cleared by the [place]  FDA and has been authorized for detection and/or diagnosis of SARS-CoV-2 by FDA under an Emergency Use Authorization (EUA). This EUA will remain in effect (meaning this test can be used) for the duration of the COVID-19 declaration under Section 564(b)(1) of the Act, 21 U.S.C. section 360bbb-3(b)(1), unless the authorization is terminated or revoked.  Performed at [facility], [address] [place], [place] [address]      Radiological Exams on Admission:   Assessment/Plan Principal Problem:   Syncope Active Problems:   Abnormal EKG   Lactic acidosis   UTI (urinary tract infection)   Wound infection_small wound in right leg   Primary myxofibrosarcoma (HCC)   Diabetes mellitus without complication (HCC)   Myocardial injury   Normocytic anemia  Thrombocytopenia   Depression with anxiety   Obesity (BMI 30-39.9)   HTN (hypertension)   OSA on CPAP   Assessment and Plan:  Syncope: Etiology is not clear.  Potential differential diagnosis due to cardiac arrhythmia.  Patient has heart rate up to 150s.  Patient has chronic right leg weakness due to sarcoma, which has not changed.  No new focal deficit on physical examination.  CT of head negative for acute intracranial abnormalities.  Epic message sent to CV [facility] pool for [facility] card consult.   - Place in PCU for obs - Orthostatic vital signs  - Frequent neuro checks  - IVF as below - PT/OT eval and treat  Abnormal EKG: Patient has heart rate up to 150s.  EKG seems to have atrial flutter with variable conduction vs. A fib.  Already converted to sinus rhythm after giving IV fluid.  TSH and free T4 are normal.  Potassium 4.2.  Magnesium 1.8. - Epic message sent to CV DIV [facility] pool for [facility] card consult.  -continue metoprolol   Lactic acidosis: Lactic acid 7.5 --> 5.9 --> 7.1.  Patient has mild leukocytosis with WBC 11.6, but no fever.  Clinically does not seem to be due to sepsis.  Likely due to dehydration. - IV fluid: 1 L LR, 2.5 L of normal saline, Naima 75 cc/h - Trend lactic acid level  UTI (urinary tract infection): Patient does not have symptoms of UTI, but has positive UA.  Since patient is on chemotherapy, and is immunosuppressed.  Will treat with antibiotics. -On IV Rocephin  - Follow-up urine culture  Wound infection_small wound in right leg: -on Rocephin  (patient received 1 dose of vancomycin , cefepime  and Flagyl  in ED) - Follow-up blood culture -wound care per RN  Primary myxofibrosarcoma [facility]): Patient is receiving chemotherapy in [facility], last dose was a week ago.  Patient is scheduled for radiation therapy tomorrow. -Follow-up with oncology in [place]  Diabetes mellitus without complication [facility]): Recent A1c 5.8.  Patient still taking glipizide , metformin  and Rybelsus   at home.   -SSI  Myocardial injury: Troponin 55.  No chest pain.  Likely demand ischemia - Trend troponin  Normocytic anemia and Thrombocytopenia: Platelet 125.  Hemoglobin stable at 10.7 (9.0 on 03/26/2024). - Follow-up with CBC  Depression with anxiety -Continue home medications  Obesity (BMI 30-39.9): Patient has Obesity Class II, with body weight 85.7  Kg and BMI 35.71 kg/m2.  - Encourage losing weight - Exercise and healthy diet  HTN: Blood pressure soft -Hold lisinopril - Continue metoprolol   OSA -on CPAP     DVT ppx: SQ Heparin     Code Status: Full code   Family Communication:  Yes, patient's daughter at bed side.   Disposition Plan:  Anticipate discharge back to previous environment  Consults called: Epic message sent to CV [facility] pool for [facility] card consult.   Admission status and Level of care: Progressive:    for obs      Dispo: The patient is from: Home              Anticipated d/c is to: Home              Anticipated d/c date is: 1 day              Patient currently is not medically stable to d/c.    Severity of Illness:  The appropriate patient status for this patient is OBSERVATION. Observation status is judged to be reasonable and necessary in order to  provide the required intensity of service to ensure the patient's safety. The patient's presenting symptoms, physical exam findings, and initial radiographic and laboratory data in the context of their medical condition is felt to place them at decreased risk for further clinical deterioration. Furthermore, it is anticipated that the patient will be medically stable for discharge from the hospital within 2 midnights of admission.        Date of Service 04/04/2024    Caleb Exon [facility]   If 7PM-7AM, please contact night-coverage [email or web address] 04/04/2024, 12:59 AM     [1]  Allergies Allergen Reactions   Simvastatin Other (See Comments)    Leg cramps    "

## 2024-04-04 ENCOUNTER — Other Ambulatory Visit (HOSPITAL_COMMUNITY): Payer: Self-pay

## 2024-04-04 ENCOUNTER — Telehealth (HOSPITAL_COMMUNITY): Payer: Self-pay | Admitting: Pharmacy Technician

## 2024-04-04 ENCOUNTER — Inpatient Hospital Stay: Admit: 2024-04-04 | Discharge: 2024-04-04 | Disposition: A | Attending: Student

## 2024-04-04 DIAGNOSIS — N390 Urinary tract infection, site not specified: Secondary | ICD-10-CM | POA: Diagnosis present

## 2024-04-04 DIAGNOSIS — G4733 Obstructive sleep apnea (adult) (pediatric): Secondary | ICD-10-CM

## 2024-04-04 DIAGNOSIS — I10 Essential (primary) hypertension: Secondary | ICD-10-CM | POA: Diagnosis present

## 2024-04-04 LAB — ECHOCARDIOGRAM COMPLETE
Height: 61 in
S' Lateral: 1.69 cm
Weight: 3024 [oz_av]

## 2024-04-04 LAB — CBC
HCT: 24.4 % — ABNORMAL LOW (ref 36.0–46.0)
Hemoglobin: 8.3 g/dL — ABNORMAL LOW (ref 12.0–15.0)
MCH: 32.9 pg (ref 26.0–34.0)
MCHC: 34 g/dL (ref 30.0–36.0)
MCV: 96.8 fL (ref 80.0–100.0)
Platelets: 76 10*3/uL — ABNORMAL LOW (ref 150–400)
RBC: 2.52 MIL/uL — ABNORMAL LOW (ref 3.87–5.11)
RDW: 19 % — ABNORMAL HIGH (ref 11.5–15.5)
WBC: 8.8 10*3/uL (ref 4.0–10.5)
nRBC: 0 % (ref 0.0–0.2)

## 2024-04-04 LAB — TROPONIN T, HIGH SENSITIVITY
Troponin T High Sensitivity: 46 ng/L — ABNORMAL HIGH (ref 0–19)
Troponin T High Sensitivity: 45 ng/L — ABNORMAL HIGH (ref 0–19)

## 2024-04-04 LAB — BASIC METABOLIC PANEL WITH GFR
Anion gap: 14 (ref 5–15)
BUN: 21 mg/dL — ABNORMAL HIGH (ref 6–20)
CO2: 18 mmol/L — ABNORMAL LOW (ref 22–32)
Calcium: 7.2 mg/dL — ABNORMAL LOW (ref 8.9–10.3)
Chloride: 106 mmol/L (ref 98–111)
Creatinine, Ser: 0.84 mg/dL (ref 0.44–1.00)
GFR, Estimated: >60 mL/min
Glucose, Bld: 336 mg/dL — ABNORMAL HIGH (ref 70–99)
Potassium: 3.8 mmol/L (ref 3.5–5.1)
Sodium: 138 mmol/L (ref 135–145)

## 2024-04-04 LAB — HIV ANTIBODY (ROUTINE TESTING W REFLEX): HIV Screen 4th Generation wRfx: NONREACTIVE

## 2024-04-04 LAB — CBG MONITORING, ED
Glucose-Capillary: 305 mg/dL — ABNORMAL HIGH (ref 70–99)
Glucose-Capillary: 229 mg/dL — ABNORMAL HIGH (ref 70–99)
Glucose-Capillary: 166 mg/dL — ABNORMAL HIGH (ref 70–99)
Glucose-Capillary: 220 mg/dL — ABNORMAL HIGH (ref 70–99)

## 2024-04-04 LAB — LACTIC ACID, PLASMA: Lactic Acid, Venous: 7.3 mmol/L (ref 0.5–1.9)

## 2024-04-04 MED ORDER — METOPROLOL SUCCINATE ER 25 MG PO TB24: 25.0000 mg | ORAL_TABLET | Freq: Every day | ORAL | Status: DC

## 2024-04-04 MED ORDER — METOPROLOL SUCCINATE ER 50 MG PO TB24
50.0000 mg | ORAL_TABLET | Freq: Every day | ORAL | Status: DC
  Administered 2024-04-04 – 2024-04-05 (×2): 50 mg via ORAL
  Filled 2024-04-04 (×2): qty 1

## 2024-04-04 MED ORDER — OXYCODONE HCL 5 MG PO TABS
5.0000 mg | ORAL_TABLET | Freq: Four times a day (QID) | ORAL | Status: DC | PRN
  Administered 2024-04-04 – 2024-04-05 (×3): 5 mg via ORAL
  Filled 2024-04-04 (×3): qty 1

## 2024-04-04 MED ORDER — SODIUM CHLORIDE 0.9 % IV BOLUS
1000.0000 mL | Freq: Once | INTRAVENOUS | Status: AC
  Administered 2024-04-04: 1000 mL via INTRAVENOUS

## 2024-04-04 MED ORDER — APIXABAN 5 MG PO TABS
5.0000 mg | ORAL_TABLET | Freq: Two times a day (BID) | ORAL | Status: DC
  Administered 2024-04-04 – 2024-04-05 (×3): 5 mg via ORAL
  Filled 2024-04-04 (×3): qty 1

## 2024-04-04 NOTE — Consult Note (Cosign Needed)
 " [facility] CLINIC CARDIOLOGY CONSULT NOTE       Patient ID: Pamela Torres MRN: [id number] DOB/AGE: May 13, 1965 59 y.o.  Admit date: 04/03/2024 Referring Physician Dr. Caleb Exon Primary Physician Halbert Mariano SQUIBB, DO  Primary Cardiologist Dr. Leotis Patterson/Heather Cloria, NP ([facility]) Reason for Consultation Syncope  HPI: Javanna Patin Marlett is a 59 y.o. 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 Cardiology was consulted for further evaluation.   Patient reports palpitations and three episodes of syncope over the last week prompting her presentation. Workup in the ED notable for creatinine 1.01, potassium 4.2, hemoglobin 10.7, WBC 11.6. Troponins 55 > 52. Lactic acid elevated - thought to be 2/2 dehydration. EKG in the ED AF RVR rate 153 bpm. CXR and CT head without acute abnormality.   At the time of my evaluation this morning, she is resting comfortably in hospital bed. We discussed her symptoms in further detail. Reports episodes of not feeling right over the last week. Has had three episodes of syncope which she says is preceeded by feeling unwell but denies any dizziness or lightheadedness. Denies any CP or SOB. Reports some palpitations intermittently. Had some overnight but feeling better this AM. Back in NSR on tele with frequent PACs.    Review of systems complete and found to be negative unless listed above    Past Medical History:  Diagnosis Date   Anemia during pregnancy    Anxiety    Chronic back pain    Cyst in hand    on left hand   DVT (deep vein thrombosis) in pregnancy    right leg   Gout    History of kidney stones    Hx of migraines    OSA on CPAP    PAC (premature atrial contraction) 2018    Sarcoma (HCC)    Sciatic pain    T2DM (type 2 diabetes mellitus) (HCC)    Wears glasses     Past Surgical History:  Procedure Laterality Date   ABDOMINAL HYSTERECTOMY     APPLICATION OF WOUND VAC Right 07/29/2021   Procedure: APPLICATION OF WOUND VAC;  Surgeon: Marea Selinda RAMAN, MD;  Location: [facility] ORS;  Service: Vascular;  Laterality: Right;   BACK SURGERY  2014   microdiskectomy - lumbar   CARDIAC CATHETERIZATION     01/19/11 [facility]): normal cononary anatomy, EF 64%   CESAREAN SECTION     INCISION AND DRAINAGE ABSCESS Right 07/29/2021   Procedure: INCISION AND DRAINAGE ABSCESS;  Surgeon: Marea Selinda RAMAN, MD;  Location: [facility] ORS;  Service: Vascular;  Laterality: Right;   KNEE SURGERY Right    arthroscopy   LUMBAR LAMINECTOMY/DECOMPRESSION MICRODISCECTOMY Right 12/10/2013   Procedure: Right Lumbar Five to Sacral One Redo Microdiskectomy;  Surgeon: Fairy Levels, MD;  Location: [facility];  Service: Neurosurgery;  Laterality: Right;  Right L5-S1 Redo Microdiskectomy   MAXIMUM ACCESS (MAS)POSTERIOR LUMBAR INTERBODY FUSION (PLIF) 1 LEVEL N/A 06/26/2014   Procedure: Lumbar five sacral one Maximum Access Posterior Lumbar Interbody Fusion;  Surgeon: Fairy Levels, MD;  Location: [facility] OR[facility];  Service: Neurosurgery;  Laterality: N/A;   ROTATOR CUFF REPAIR Right    TRANSFORAMINAL LUMBAR INTERBODY FUSION (TLIF) WITH PEDICLE SCREW FIXATION 1 LEVEL N/A 07/28/2020   Procedure: Lumbar Four-Five Transforaminal lumbar interbody  fusion with exploration of Lumbar Five- Sacral One Fusion;  Surgeon: Unice Pac, MD;  Location: [facility] OR;  Service: Neurosurgery;  Laterality: N/A;  Lumbar Four-Five Transforaminal lumbar interbody fusion with exploration of Lumbar Five- Sacral One Fusion    (Not in a hospital admission)  Social History   Socioeconomic History   Marital status: Married    Spouse name: Not on file   Number of children: Not on file   Years of education: Not on file   Highest education level: Not on file   Occupational History   Not on file  Tobacco Use   Smoking status: Never   Smokeless tobacco: Never  Vaping Use   Vaping status: Never Used  Substance and Sexual Activity   Alcohol use: No   Drug use: No   Sexual activity: Not on file  Other Topics Concern   Not on file  Social History Narrative   Lives at home with husband.    Social Drivers of Health   Tobacco Use: Low Risk (04/03/2024)   Patient History    Smoking Tobacco Use: Never    Smokeless Tobacco Use: Never    Passive Exposure: Not on file  Financial Resource Strain: Low Risk  (10/04/2023)   Received from [facility]   Overall Financial Resource Strain (CARDIA)    Difficulty of Paying Living Expenses: Not very hard  Recent Concern: Financial Resource Strain - Medium Risk (08/06/2023)   Received from [facility]   Overall Financial Resource Strain (CARDIA)    Difficulty of Paying Living Expenses: Somewhat hard  Food Insecurity: No Food Insecurity (10/04/2023)   Received from [facility]   Epic    Within the past 12 months, you worried that your food would run out before you got the money to buy more.: Never true    Within the past 12 months, the food you bought just didn't last and you didn't have money to get more.: Never true  Recent Concern: Food Insecurity - Food Insecurity Present (08/06/2023)   Received from [facility]   Epic    Within the past 12 months, you worried that your food would run out before you got the money to buy more.: Sometimes true    Within the past 12 months, the food you bought just didn't last and you didn't have money to get more.: Often true  Transportation Needs: No Transportation Needs (10/04/2023)   Received from [facility] - Transportation    In the past 12 months, has lack of transportation kept you from medical appointments or from getting medications?: No    Lack of Transportation  (Non-Medical): No  Physical Activity: Not on file  Stress: Not on file  Social Connections: Not on file  Intimate Partner Violence: Not on file  Depression (EYV7-0): Not on file  Alcohol Screen: Not on file  Housing: Low Risk  (03/06/2024)   Received from [facility]   Epic    In the last 12 months, was there a time when you were not able to pay the mortgage or rent on time?: No    In the past 12 months, how many times have you moved where you were living?: 0    At any time in the past 12 months, were you homeless or living in a shelter (including now)?: No  Utilities: Not At Risk (09/28/2023)   Received from [facility]  Epic    In the past 12 months has the electric, gas, oil, or water company threatened to shut off services in your home?: No  Health Literacy: Not on file    Family History  Problem Relation Age of Onset   Thyroid disease Mother    COPD Mother    Emphysema Mother    Diabetes Father    Heart attack Father    Leukemia Father    Hypertension Brother    Diabetes Brother    Diabetes Brother    Hypertension Brother    Diabetes Brother    Breast cancer Maternal Aunt      Vitals:   04/04/24 0530 04/04/24 0600 04/04/24 0630 04/04/24 0907  BP: 125/65 131/60 132/67   Pulse: (!) 104 (!) 103 (!) 103   Resp: 11 10 16    Temp:      TempSrc:      SpO2: 96% 96% 97% 97%  Weight:      Height:        PHYSICAL EXAM General: Chronically ill appearing female, well nourished, in no acute distress. HEENT: Normocephalic and atraumatic. Neck: No JVD.  Lungs: Normal respiratory effort on room air. Clear bilaterally to auscultation. No wheezes, crackles, rhonchi.  Heart: HRRR. Normal S1 and S2 without gallops or murmurs.  Abdomen: Non-distended appearing.  Msk: Normal strength and tone for age. Extremities: Warm and well perfused. No clubbing, cyanosis. No edema.  Neuro: Alert and oriented X 3. Psych: Answers questions appropriately.    Labs: Basic Metabolic Panel: Recent Labs    04/03/24 1535 04/04/24 0323  NA 133* 138  K 4.2 3.8  CL 94* 106  CO2 16* 18*  GLUCOSE 295* 336*  BUN 26* 21*  CREATININE 1.01* 0.84  CALCIUM 9.3 7.2*  MG 1.8  --   PHOS 3.1  --    Liver Function Tests: Recent Labs    04/03/24 1535  AST 27  ALT 57*  ALKPHOS 117  BILITOT 0.3  PROT 6.6  ALBUMIN  4.0   No results for input(s): LIPASE, AMYLASE in the last 72 hours. CBC: Recent Labs    04/03/24 1535 04/04/24 0323  WBC 11.6* 8.8  NEUTROABS 7.8*  --   HGB 10.7* 8.3*  HCT 31.5* 24.4*  MCV 95.2 96.8  PLT 125* 76*   Cardiac Enzymes: No results for input(s): CKTOTAL, CKMB, CKMBINDEX, TROPONINIHS in the last 72 hours. BNP: No results for input(s): BNP in the last 72 hours. D-Dimer: No results for input(s): DDIMER in the last 72 hours. Hemoglobin A1C: No results for input(s): HGBA1C in the last 72 hours. Fasting Lipid Panel: No results for input(s): CHOL, HDL, LDLCALC, TRIG, CHOLHDL, LDLDIRECT in the last 72 hours. Thyroid Function Tests: Recent Labs    04/03/24 2119  TSH 3.640   Anemia Panel: No results for input(s): VITAMINB12, FOLATE, FERRITIN, TIBC, IRON, RETICCTPCT in the last 72 hours.   Radiology: CT HEAD WO CONTRAST ( ) Result Date: 04/03/2024 EXAM: CT HEAD WITHOUT CONTRAST 04/03/2024 11:42:24 PM TECHNIQUE: CT of the head was performed without the administration of intravenous contrast. Automated exposure control, iterative reconstruction, and/or weight based adjustment of the mA/kV was utilized to reduce the radiation dose to as low as reasonably achievable. COMPARISON: None available. CLINICAL HISTORY: Syncope/presyncope; cerebrovascular cause suspected. FINDINGS: BRAIN AND VENTRICLES: Normal brain volume for age. No acute hemorrhage. No evidence of acute infarct. No hydrocephalus. No extra-axial collection. No mass effect or midline shift. ORBITS: No acute abnormality.  SINUSES: No acute abnormality. SOFT  TISSUES AND SKULL: No acute soft tissue abnormality. No skull fracture. IMPRESSION: 1. No acute intracranial abnormality. Electronically signed by: Norman Gatlin MD 04/03/2024 11:50 PM EST RP Workstation: [id number]   DG Chest Port 1 View Result Date: 04/03/2024 EXAM: 1 VIEW XRAY OF THE CHEST 04/03/2024 03:55:00 PM COMPARISON: 05/27/2013 CLINICAL HISTORY: Query sepsis; evaluate for abnormality. FINDINGS: LINES, TUBES AND DEVICES: Right IJ port catheter in place tip at the cavoatrial junction. LUNGS AND PLEURA: No focal pulmonary opacity. No pleural effusion. No pneumothorax. HEART AND MEDIASTINUM: Mild cardiomegaly. No acute abnormality of the mediastinal silhouette. BONES AND SOFT TISSUES: Thoracic degenerative changes. No acute osseous abnormality. IMPRESSION: 1. No acute cardiopulmonary abnormality. Electronically signed by: Rogelia Myers MD 04/03/2024 04:29 PM EST RP Workstation: [id number]    ECHO ordered  TELEMETRY (personally reviewed): sinus tach PACs rate 100s  EKG (personally reviewed): AF RVR rate 153 bpm  Data reviewed by me 04/04/2024: last 24h vitals tele labs imaging I/O ED provider note, admission H&P  Principal Problem:   Syncope Active Problems:   Primary myxofibrosarcoma (HCC)   Diabetes mellitus without complication (HCC)   Obesity (BMI 30-39.9)   Depression with anxiety   Normocytic anemia   Thrombocytopenia   Lactic acidosis   Myocardial injury   Abnormal EKG   Wound infection_small wound in right leg   UTI (urinary tract infection)   HTN (hypertension)   OSA on CPAP    ASSESSMENT AND PLAN:  Piper G Selinger is a 59 y.o. 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 Cardiology was consulted for further evaluation.   # Syncope # Atrial fibrillation RVR, new onset # Paroxysmal atrial fibrillation # Myxofibrosarcoma # Hx stress-induced cardiomyopathy Patient presented with complaints of palpitations and 3 syncopal episodes over the last week. Initially found to be in AF RVR on EKG which resolved after IV fluids. Concern for dehydration. Currently undergoing chemotherapy at [facility].  -Echo ordered, further recommendations pending these results.  -Recommend starting anticoagulation for atrial fibrillation with close monitoring of Hgb. No other apparent contraindication at this time.  -Increase metoprolol  succinate to 50 mg daily.  -Agree with IV fluids.  -Minimal and flat troponin most consistent with demand/supply mismatch and not ACS.  -Would recommend cardiac monitor on discharge.    This patient's plan of care was discussed and created with Dr. Florencio and he is in agreement.  Signed: Danita Bloch, PA-C  04/04/2024, 10:43 AM [facility] Card[facility]      "

## 2024-04-04 NOTE — Inpatient Diabetes Management (Signed)
 Inpatient Diabetes Program Recommendations  AACE/ADA: New Consensus Statement on Inpatient Glycemic Control (2015)  Target Ranges:  Prepandial:   less than 140 mg/dL      Peak postprandial:   less than 180 mg/dL (1-2 hours)      Critically ill patients:  140 - 180 mg/dL    Latest Reference Range & Units 04/03/24 22:09 04/03/24 22:29 04/04/24 07:41  Glucose-Capillary 70 - 99 mg/dL 649 (H) 630 (H)  5 units Novolog   305 (H)  (H): Data is abnormally high   Admit with: Syncope/ Lactic Acidosis/ UTI/ Wound Infection RLE  History: DM2, Primary myxofibrosarcoma (receiving chemotherapy at [facility])  Home DM Meds: Glipizide  5 mg daily       Metformin  500 mg BID       Rybelsus  3 mg daily  Current Orders: Novolog  Sensitive Correction Scale/ SSI (0-9 units) TID AC + HS    Looks like pt has ENDO appt 04/23/2024  MD- Note CBG 305 this AM.  Got Novolog  last PM.    Please consider starting weight based basal insulin : Glargine 8 units daily (0.1 units/kg)  Please add Hemoglobin A1c to labs (last one on file was 5.3% July 2025--See note from PCP office 02/26/2024)   --Will follow patient during hospitalization--  Adina Rudolpho Arrow RN, MSN, CDCES Diabetes Coordinator Inpatient Glycemic Control Team Team Pager: [phone] (8a-5p)

## 2024-04-04 NOTE — Progress Notes (Signed)
 Physical Therapy Treatment Patient Details Name: Pamela Torres MRN: [id number] DOB: 1965/10/02 Today's Date: 04/04/2024   History of Present Illness 59 y/o female presented to ED on 04/03/24 for syncope x 3. PMH: NSTEMI, primary myxofibrosarcoma on R leg on chemotherapy at [facility], HTN, diabetes, depression/anxiety, obesity, OSA on CPAP    PT Comments  Pt seen this pm, daughter at bedside. Pt continues to experience dizziness primarily in standing, however vitals appeared stable in all positions. Pt able to ambulate ~41ft with RW for safety and CGA. No LOB, however pt did need to turn around due to head feeling foggy and vision blurring. Pt concerned Chemo is causing syncopal events and considering stopping treatment. Pt agreeable to staying in ED for continued monitoring. Pt has a RW, Rollator, and w/c at home   If plan is discharge home, recommend the following: A little help with walking and/or transfers;A little help with bathing/dressing/bathroom;Assistance with cooking/housework;Help with stairs or ramp for entrance;Assist for transportation   Can travel by private vehicle        Equipment Recommendations  Rolling walker (2 wheels)    Recommendations for Other Services       Precautions / Restrictions Precautions Precautions: Fall Recall of Precautions/Restrictions: Intact Precaution/Restrictions Comments:  (3 syncopal events at home) Restrictions Weight Bearing Restrictions Per Provider Order: No     Mobility  Bed Mobility Overal bed mobility: Needs Assistance Bed Mobility: Supine to Sit, Sit to Supine     Supine to sit: Supervision Sit to supine: Supervision   General bed mobility comments: reports feeling funny upon sitting EOB    Transfers Overall transfer level: Needs assistance Equipment used: Rolling walker (2 wheels) Transfers: Sit to/from Stand Sit to Stand: Contact guard assist           General transfer comment:  (Utilized RW for safety and  stability)    Ambulation/Gait Ambulation/Gait assistance: Contact guard assist Gait Distance (Feet): 50 Feet Assistive device: Rolling walker (2 wheels) Gait Pattern/deviations: Step-through pattern, Decreased step length - right, Decreased step length - left, Wide base of support Gait velocity: decr     General Gait Details:  (Pt symptomatic while ambulating, c/o head feeling foggy with increased blurred vision)   Stairs             Wheelchair Mobility     Tilt Bed    Modified Rankin (Stroke Patients Only)       Balance Overall balance assessment: Needs assistance Sitting-balance support: No upper extremity supported, Feet supported Sitting balance-Leahy Scale: Good     Standing balance support: Bilateral upper extremity supported, During functional activity, Reliant on assistive device for balance Standing balance-Leahy Scale: Fair Standing balance comment:  (Hx of falls)                            Communication Communication Communication: No apparent difficulties  Cognition Arousal: Alert Behavior During Therapy: WFL for tasks assessed/performed   PT - Cognitive impairments: No apparent impairments                                Cueing Cueing Techniques: Verbal cues  Exercises      General Comments General comments (skin integrity, edema, etc.):  (Discussed current level of function, hx of falls, and safety at home.)      Pertinent Vitals/Pain Pain Assessment Pain Assessment: No/denies pain    Home  Living                          Prior Function            PT Goals (current goals can now be found in the care plan section) Acute Rehab PT Goals Patient Stated Goal: to go home    Frequency    Min 2X/week      PT Plan      Co-evaluation              AM-PAC PT 6 Clicks Mobility   Outcome Measure  Help needed turning from your back to your side while in a flat bed without using  bedrails?: A Little Help needed moving from lying on your back to sitting on the side of a flat bed without using bedrails?: A Little Help needed moving to and from a bed to a chair (including a wheelchair)?: A Little Help needed standing up from a chair using your arms (e.g., wheelchair or bedside chair)?: A Little Help needed to walk in hospital room?: A Little Help needed climbing 3-5 steps with a railing? : A Little 6 Click Score: 18    End of Session Equipment Utilized During Treatment: Gait belt Activity Tolerance: Treatment limited secondary to medical complications (Comment) (dizziness) Patient left: in bed;with call bell/phone within reach;with family/visitor present Nurse Communication: Mobility status PT Visit Diagnosis: Muscle weakness (generalized) (M62.81);History of falling (Z91.81);Unsteadiness on feet (R26.81)     Time: 8398-8370 PT Time Calculation (min) (ACUTE ONLY): 28 min  Charges:    $Gait Training: 8-22 mins $Therapeutic Activity: 8-22 mins PT General Charges $$ ACUTE PT VISIT: 1 Visit                    [name], PTA  [name] 04/04/2024, 5:18 PM

## 2024-04-04 NOTE — ED Notes (Signed)
 Attempted to contact hospitalist at this time regarding pt's elevated glucose.

## 2024-04-04 NOTE — Progress Notes (Signed)
 " PROGRESS NOTE    [name]   FMW:[id number] DOB: [birth year]  DOA: 04/03/2024 Date of Service: 04/04/24 which is hospital day 0  PCP: Halbert Mariano SQUIBB, DO    Pamela Torres is a 59 y.o. 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  No chest pain, cough, SOB. Per her daughter at the bedside, patient passed out 3 times since yesterday when she was walking at home. Patient has chronic right leg weakness due to sarcoma which has not changed.  Pt has a small wound with pus drainage in the medial side of right leg, close to knee where the sarcoma tumor is located.  Pt is on chemo. Last treatment last week. Pt treated at [facility].    Hospital course / significant events: 02/04: to ED. Tachycardic, soft BP w/ low diastolic but no severe hypotension, on RA, RR intermittently into low 20s but mostly WNL, afebrile. Cr 1.01, troponin 50-40 flat, lactic acid 7.5, Procal 0.27, WBC 11, Hgb 10.7, Neut 7.8, neg COVID/flu/RSV, UA question UTI, pend blood/urine cultures, started on ceftriaxone . EKG w/ apparent atrial flutter with variable conduction, already converted to sinus rhythm after giving IV fluid. CT-head negative. No new focal neurodeficit. Admitted to hospitalist service w/ cardiology consult.  02/05: Cardiology consult - Afib - staring Eliquis , increased metoprolol . Echo --> hyperdynamic fxn, preserved EF, increased RV wall thickness. (+)PT/OT recs for SNF, significant gait instability.     03/04/2023 MRI RLE with slight disease progression. CT Chest stable tiny </=32mm nodules. 01/09/2024 discussed amputation vs. Trabectedin, elected trabectedin W. 01/23/2024 C1D1 Trabectedin; 02/05/2024 PET WB: preliminary  available at time of visit (called rads workroom; was told that the fellow had signed off on the scan & no additional changes would be made to the read when the attending would sign): interval increased size with heterogenous and decreased metabolic activity of the masses involving the subcutaneous tissues of the right medial thigh. Could represent areas of necrosis; attn on f/u. No evidence of distant mets; 02/06/2024 C2; 03/05/2024 MRI Femur Right: some interval increase, just below 20% per RECIST, overall slower velocity than prior to trabectedin; CT: not completed (MRI ran long); OKTT C3; 03/26/2024 C4 Trabectedin        Consultants:  Cardiology   Procedures/Surgeries: none      ASSESSMENT & PLAN:   Syncope Etiology is not clear.   Potential differential diagnosis due to cardiac arrhythmia. Patient has chronic right leg weakness due to sarcoma, which has not changed - no new focal deficit.  CT of head negative.  Echo 12/2023 at [facility] - EF 55, mild LVH, indeterminate diastolic fxn, no significant valvular disease Cardiology consult - Orthostatic VS  IV fluids  PT/OT to see  Pending cultures    Abnormal Echo Question medication effect cardiomyopathy?  Hyperdynamic systolic function, preserved EF, increased thickness RV wall  Of note, echo 12/2023 w/ onc team done as baseline w/ Rx Trabectedin, at that time echo no concerns  Will need close follow up but echo findings are not likely to be related to syncope   Abnormal EKG: Afib   HR up to 150s.  EKG seems to have atrial flutter with variable conduction.  Converted to sinus rhythm after giving IV fluid.   TSH and free T4 are normal.  Potassium 4.2.  Magnesium 1.8. Cardiology consult Increased metoprolol  Eliquis   Echo as above   Lactic acidosis: Lactic acid 7.5 --> 5.9 --> 7.1.  Patient has mild leukocytosis with WBC 11.6, but no fever.  Clinically does not seem to be due to sepsis, as tachycardia has alternative explanation.   Likely due to dehydration. IV fluids NS Trend lactic   UTI (urinary tract infection) Patient does not have symptoms of UTI, but has positive UA.   Since patient is on chemotherapy, and is immunosuppressed.  Will treat with antibiotics - IV Rocephin  Follow-up urine culture   Wound infection_small wound in right leg associated w/ sarcoma: on Rocephin  (patient received 1 dose of vancomycin , cefepime  and Flagyl  in ED) Follow-up blood culture wound care   Primary myxofibrosarcoma Patient is receiving chemotherapy in [facility], last dose was a week ago.  Patient is scheduled for radiation therapy tomorrow. Follow-up with oncology in [facility]   Diabetes mellitus without complication Recent A1c 5.8.  Patient still taking glipizide , metformin  and Rybelsus  at home.   SSI   Myocardial injury:  Troponin 55.  No chest pain.   Likely demand ischemia Trend troponin will not start heparin  at this time    Normocytic anemia  Hemoglobin stable at 10.7 (9.0 on 03/26/2024). Thrombocytopenia: Platelet 125.  Follow CBC   Depression with anxiety Continue home medications  Essential HTN: Blood pressure soft Hold lisinopril Continue metoprolol    OSA CPAP    Class 2 obesity based on BMI: Body mass index is 35.71 kg/m.SABRA Significantly low or high BMI is associated with higher medical risk.  Underweight - under 18  overweight - 25 to 29 obese - 30 or more Class 1 obesity: BMI of 30.0 to 34 Class 2 obesity: BMI of 35.0 to 39 Class 3 obesity: BMI of 40.0 to 49 Super Morbid Obesity: BMI 50-59 Super-super Morbid Obesity: BMI 60+ Healthy nutrition and physical activity advised as adjunct to other disease management and risk reduction treatments    DVT prophylaxis: heparin  sq tid IV fluids: NS 75 cc/h continuous IV fluids  Nutrition: cardiac/carb diet  Central lines / other devices: none  Code Status: FULL CODE ACP documentation reviewed:  none on file in [facility]  TOC needs: SNF/HH Medical  barriers to dispo at this time: monitoring orthostatics, discuss cardio follow up . Expected readiness for discharge per TOC at this time:               Subjective / Brief ROS:  Patient reports fatigue but would like to go home if possible Denies CP/SOB at rest, hasn't been out of bed.  Pain controlled.  Denies new weakness.  Tolerating diet.  Reports no concerns w/ urination/defecation.   Family Communication: daughter at bedside on rounds     Objective Findings:  Vitals:   04/04/24 1555 04/04/24 1600 04/04/24 1605 04/04/24 1610  BP: 128/78 134/69 124/68 137/70  Pulse:      Resp: (!) 21 18 18 16   Temp:      TempSrc:      SpO2:      Weight:      Height:        Intake/Output Summary (Last 24 hours) at 04/04/2024 1802 Last data filed at 04/04/2024 1011 Gross per 24 hour  Intake 3393.92 ml  Output --  Net 3393.92 ml   Filed Weights   04/03/24 1521  Weight: 85.7 kg  Examination:  Physical Exam Constitutional:      General: She is not in acute distress.    Appearance: She is not ill-appearing.  Cardiovascular:     Rate and Rhythm: Normal rate and regular rhythm.  Pulmonary:     Effort: Pulmonary effort is normal.     Breath sounds: Normal breath sounds.  Abdominal:     Palpations: Abdomen is soft.  Musculoskeletal:     Right lower leg: No edema.     Left lower leg: No edema.  Skin:    Findings: Lesion (covered lesion on RLE medial just distal to knee where her cancer is located, reviewed image see below, bandage was not disturbed on exam) present.  Neurological:     Mental Status: She is alert and oriented to person, place, and time. Mental status is at baseline.  Psychiatric:        Mood and Affect: Mood normal.        Behavior: Behavior normal.          Scheduled Medications:   allopurinol   100 mg Oral Daily   apixaban   5 mg Oral BID   DULoxetine   60 mg Oral Daily   gabapentin   300 mg Oral Daily   And   gabapentin   600 mg Oral QHS    icosapent  Ethyl  2 g Oral BID   insulin  aspart  0-5 Units Subcutaneous QHS   insulin  aspart  0-9 Units Subcutaneous TID WC   metoprolol  succinate  50 mg Oral Daily   OLANZapine   2.5 mg Oral QHS    Continuous Infusions:  cefTRIAXone  (ROCEPHIN )  IV Stopped (04/04/24 0002)    PRN Medications:  acetaminophen , hydrOXYzine , ondansetron  (ZOFRAN ) IV, oxyCODONE   Antimicrobials from admission:  Anti-infectives (From admission, onward)    Start     Dose/Rate Route Frequency Ordered Stop   04/04/24 0000  cefTRIAXone  (ROCEPHIN ) 2 g in sodium chloride  0.9 % 100 mL IVPB        2 g 200 mL/hr over 30 Minutes Intravenous Every 24 hours 04/03/24 2306     04/03/24 1645  ceFEPIme  (MAXIPIME ) 2 g in sodium chloride  0.9 % 100 mL IVPB        2 g 200 mL/hr over 30 Minutes Intravenous  Once 04/03/24 1633 04/03/24 1713   04/03/24 1645  metroNIDAZOLE  (FLAGYL ) IVPB 500 mg        500 mg 100 mL/hr over 60 Minutes Intravenous  Once 04/03/24 1633 04/03/24 1842   04/03/24 1645  vancomycin  (VANCOCIN ) IVPB 1000 mg/200 mL premix        1,000 mg 200 mL/hr over 60 Minutes Intravenous  Once 04/03/24 1633 04/03/24 1943           Data Reviewed:  I have personally reviewed the following...  CBC: Recent Labs  Lab 04/03/24 1535 04/04/24 0323  WBC 11.6* 8.8  NEUTROABS 7.8*  --   HGB 10.7* 8.3*  HCT 31.5* 24.4*  MCV 95.2 96.8  PLT 125* 76*   Basic Metabolic Panel: Recent Labs  Lab 04/03/24 1535 04/04/24 0323  NA 133* 138  K 4.2 3.8  CL 94* 106  CO2 16* 18*  GLUCOSE 295* 336*  BUN 26* 21*  CREATININE 1.01* 0.84  CALCIUM 9.3 7.2*  MG 1.8  --   PHOS 3.1  --    GFR: Estimated Creatinine Clearance: 70.8 mL/min (by C-G formula based on SCr of 0.84 mg/dL). Liver Function Tests: Recent Labs  Lab 04/03/24 1535  AST 27  ALT 57*  ALKPHOS 117  BILITOT 0.3  PROT 6.6  ALBUMIN  4.0   No results for input(s): LIPASE, AMYLASE in the last 168 hours. No results for input(s): AMMONIA in the last  168 hours. Coagulation Profile: Recent Labs  Lab 04/03/24 1535  INR 1.0   Cardiac Enzymes: No results for input(s): CKTOTAL, CKMB, CKMBINDEX, TROPONINI in the last 168 hours. BNP (last 3 results) No results for input(s): PROBNP in the last 8760 hours. HbA1C: No results for input(s): HGBA1C in the last 72 hours. CBG: Recent Labs  Lab 04/03/24 2209 04/03/24 2229 04/04/24 0741 04/04/24 1241 04/04/24 1745  GLUCAP 350* 369* 305* 229* 166*   Lipid Profile: No results for input(s): CHOL, HDL, LDLCALC, TRIG, CHOLHDL, LDLDIRECT in the last 72 hours. Thyroid Function Tests: Recent Labs    04/03/24 2119  TSH 3.640  FREET4 1.17   Anemia Panel: No results for input(s): VITAMINB12, FOLATE, FERRITIN, TIBC, IRON, RETICCTPCT in the last 72 hours. Most Recent Urinalysis On File:     Component Value Date/Time   COLORURINE YELLOW (A) 04/03/2024 2119   APPEARANCEUR HAZY (A) 04/03/2024 2119   LABSPEC 1.023 04/03/2024 2119   PHURINE 5.0 04/03/2024 2119   GLUCOSEU 150 (A) 04/03/2024 2119   HGBUR NEGATIVE 04/03/2024 2119   BILIRUBINUR NEGATIVE 04/03/2024 2119   KETONESUR NEGATIVE 04/03/2024 2119   PROTEINUR NEGATIVE 04/03/2024 2119   NITRITE NEGATIVE 04/03/2024 2119   LEUKOCYTESUR LARGE (A) 04/03/2024 2119   Sepsis Labs: @LABRCNTIP (procalcitonin:4,lacticidven:4) Microbiology: Recent Results (from the past 240 hours)  Blood Culture (routine x 2)     Status: None (Preliminary result)   Collection Time: 04/03/24  3:35 PM   Specimen: BLOOD  Result Value Ref Range Status   Specimen Description BLOOD BLOOD RIGHT FOREARM  Final   Special Requests   Final    BOTTLES DRAWN AEROBIC AND ANAEROBIC Blood Culture adequate volume   Culture   Final    NO GROWTH < 24 HOURS Performed at [facility], [address] [place], [place] [address]    Report Status PENDING  Incomplete  Blood Culture (routine x 2)     Status: None (Preliminary result)    Collection Time: 04/03/24  4:00 PM   Specimen: BLOOD  Result Value Ref Range Status   Specimen Description BLOOD BLOOD LEFT FOREARM  Final   Special Requests   Final    BOTTLES DRAWN AEROBIC AND ANAEROBIC Blood Culture results [place] not be optimal due to an inadequate volume of blood received in culture bottles   Culture   Final    NO GROWTH < 24 HOURS Performed at [facility], [address] [place], [place] [address]    Report Status PENDING  Incomplete  Resp panel by RT-PCR (RSV, Flu A&B, Covid) Anterior Nasal Swab     Status: None   Collection Time: 04/03/24  4:35 PM   Specimen: Anterior Nasal Swab  Result Value Ref Range Status   SARS Coronavirus 2 by RT PCR NEGATIVE NEGATIVE Final    Comment: (NOTE) SARS-CoV-2 target nucleic acids are NOT DETECTED.  The SARS-CoV-2 RNA is generally detectable in upper respiratory specimens during the acute phase of infection. The lowest concentration of SARS-CoV-2 viral copies this assay can detect is 138 copies/mL. A negative result does not preclude SARS-Cov-2 infection and should not be used as the sole basis for treatment or other patient management decisions. A negative result [place] occur with  improper specimen collection/handling, submission of specimen other than nasopharyngeal swab, presence of viral  mutation(s) within the areas targeted by this assay, and inadequate number of viral copies(<138 copies/mL). A negative result must be combined with clinical observations, patient history, and epidemiological information. The expected result is Negative.  Fact Sheet for Patients:  bloggercourse.com  Fact Sheet for Healthcare Providers:  seriousbroker.it  This test is no t yet approved or cleared by the [place]  FDA and  has been authorized for detection and/or diagnosis of SARS-CoV-2 by FDA under an Emergency Use Authorization (EUA). This EUA will remain  in effect  (meaning this test can be used) for the duration of the COVID-19 declaration under Section 564(b)(1) of the Act, 21 U.S.C.section 360bbb-3(b)(1), unless the authorization is terminated  or revoked sooner.       Influenza A by PCR NEGATIVE NEGATIVE Final   Influenza B by PCR NEGATIVE NEGATIVE Final    Comment: (NOTE) The Xpert Xpress SARS-CoV-2/FLU/RSV plus assay is intended as an aid in the diagnosis of influenza from Nasopharyngeal swab specimens and should not be used as a sole basis for treatment. Nasal washings and aspirates are unacceptable for Xpert Xpress SARS-CoV-2/FLU/RSV testing.  Fact Sheet for Patients: bloggercourse.com  Fact Sheet for Healthcare Providers: seriousbroker.it  This test is not yet approved or cleared by the [place]  FDA and has been authorized for detection and/or diagnosis of SARS-CoV-2 by FDA under an Emergency Use Authorization (EUA). This EUA will remain in effect (meaning this test can be used) for the duration of the COVID-19 declaration under Section 564(b)(1) of the Act, 21 U.S.C. section 360bbb-3(b)(1), unless the authorization is terminated or revoked.     Resp Syncytial Virus by PCR NEGATIVE NEGATIVE Final    Comment: (NOTE) Fact Sheet for Patients: bloggercourse.com  Fact Sheet for Healthcare Providers: seriousbroker.it  This test is not yet approved or cleared by the [place]  FDA and has been authorized for detection and/or diagnosis of SARS-CoV-2 by FDA under an Emergency Use Authorization (EUA). This EUA will remain in effect (meaning this test can be used) for the duration of the COVID-19 declaration under Section 564(b)(1) of the Act, 21 U.S.C. section 360bbb-3(b)(1), unless the authorization is terminated or revoked.  Performed at [facility], [address] [place], [place] [address]       Radiology  Studies last 3 days: ECHOCARDIOGRAM COMPLETE Result Date: 04/04/2024    ECHOCARDIOGRAM REPORT   Patient Name:   Pamela Torres Kapfer Date of Exam: 04/04/2024 Medical Rec #:  [id number]     Height:       61.0 in Accession #:    [id number]    Weight:       189.0 lb Date of Birth:  December 20, 1965     BSA:          1.844 m Patient Age:    58 years      BP:           132/67 mmHg Patient Gender: F             HR:           103 bpm. Exam Location:  [facility] Procedure: 2D Echo, Cardiac Doppler and Color Doppler (Both Spectral and Color            Flow Doppler were utilized during procedure). Indications:     Atrial Fibrillation I48.91  History:         Patient has no prior history of Echocardiogram examinations.  Risk Factors:Diabetes. Anxiety, migraines.  Sonographer:     Christopher Furnace Referring Phys:  [id number] CARALYN HUDSON Diagnosing Phys: Cara JONETTA Lovelace MD  Sonographer Comments: Technically challenging study due to limited acoustic windows, no apical window and suboptimal subcostal window. IMPRESSIONS  1. Left ventricular ejection fraction, by estimation, is 70 to 75%. The left ventricle has hyperdynamic function. The left ventricle has no regional wall motion abnormalities. There is moderate asymmetric left ventricular hypertrophy of the septal segment. Left ventricular diastolic function could not be evaluated.  2. Right ventricular systolic function is normal. The right ventricular size is mildly enlarged. Severely increased right ventricular wall thickness.  3. Left atrial size was mildly dilated.  4. Right atrial size was mildly dilated.  5. The mitral valve is normal in structure. No evidence of mitral valve regurgitation.  6. The aortic valve is normal in structure. Aortic valve regurgitation is not visualized. FINDINGS  Left Ventricle: Left ventricular ejection fraction, by estimation, is 70 to 75%. The left ventricle has hyperdynamic function. The left ventricle has no regional wall motion abnormalities. Strain  was performed and the global longitudinal strain is indeterminate. The left ventricular internal cavity size was normal in size. There is moderate asymmetric left ventricular hypertrophy of the septal segment. Left ventricular diastolic function could not be evaluated. Right Ventricle: The right ventricular size is mildly enlarged. Severely increased right ventricular wall thickness. Right ventricular systolic function is normal. Left Atrium: Left atrial size was mildly dilated. Right Atrium: Right atrial size was mildly dilated. Pericardium: Trivial pericardial effusion is present. Mitral Valve: The mitral valve is normal in structure. No evidence of mitral valve regurgitation. Tricuspid Valve: The tricuspid valve is normal in structure. Tricuspid valve regurgitation is trivial. Aortic Valve: The aortic valve is normal in structure. Aortic valve regurgitation is not visualized. Pulmonic Valve: The pulmonic valve was normal in structure. Pulmonic valve regurgitation is not visualized. Aorta: The aortic root was not well visualized. IAS/Shunts: No atrial level shunt detected by color flow Doppler. Additional Comments: 3D was performed not requiring image post processing on an independent workstation and was indeterminate.  LEFT VENTRICLE PLAX 2D LVIDd:         2.89 cm LVIDs:         1.69 cm LV PW:         1.21 cm LV IVS:        3.66 cm LVOT diam:     2.00 cm LVOT Area:     3.14 cm  LEFT ATRIUM         Index LA diam:    4.30 cm 2.33 cm/m   AORTA Ao Root diam: 3.50 cm  SHUNTS Systemic Diam: 2.00 cm Dwayne D Callwood MD Electronically signed by Cara JONETTA Lovelace MD Signature Date/Time: 04/04/2024/12:31:15 PM    Final    CT HEAD WO CONTRAST ( ) Result Date: 04/03/2024 EXAM: CT HEAD WITHOUT CONTRAST 04/03/2024 11:42:24 PM TECHNIQUE: CT of the head was performed without the administration of intravenous contrast. Automated exposure control, iterative reconstruction, and/or weight based adjustment of the mA/kV was  utilized to reduce the radiation dose to as low as reasonably achievable. COMPARISON: None available. CLINICAL HISTORY: Syncope/presyncope; cerebrovascular cause suspected. FINDINGS: BRAIN AND VENTRICLES: Normal brain volume for age. No acute hemorrhage. No evidence of acute infarct. No hydrocephalus. No extra-axial collection. No mass effect or midline shift. ORBITS: No acute abnormality. SINUSES: No acute abnormality. SOFT TISSUES AND SKULL: No acute soft tissue abnormality. No skull fracture. IMPRESSION: 1. No  acute intracranial abnormality. Electronically signed by: Norman Gatlin MD 04/03/2024 11:50 PM EST RP Workstation: [id number]   DG Chest Port 1 View Result Date: 04/03/2024 EXAM: 1 VIEW XRAY OF THE CHEST 04/03/2024 03:55:00 PM COMPARISON: 05/27/2013 CLINICAL HISTORY: Query sepsis; evaluate for abnormality. FINDINGS: LINES, TUBES AND DEVICES: Right IJ port catheter in place tip at the cavoatrial junction. LUNGS AND PLEURA: No focal pulmonary opacity. No pleural effusion. No pneumothorax. HEART AND MEDIASTINUM: Mild cardiomegaly. No acute abnormality of the mediastinal silhouette. BONES AND SOFT TISSUES: Thoracic degenerative changes. No acute osseous abnormality. IMPRESSION: 1. No acute cardiopulmonary abnormality. Electronically signed by: Rogelia Myers MD 04/03/2024 04:29 PM EST RP Workstation: [id number]       Time spent: 50 min     Nimesh Riolo, DO [facility] 04/04/2024, 6:02 PM    Dictation software Kram have been used to generate the above note. Typos Caswell occur and escape review in typed/dictated notes. Please contact Dr Marsa directly for clarity if needed.  Staff [place] message me via secure chat in Epic  but this Mokry not receive an immediate response,  please page me for urgent matters!  If 7PM-7AM, please contact night coverage [email or web address]       "

## 2024-04-04 NOTE — Evaluation (Signed)
 Occupational Therapy Evaluation Patient Details Name: Pamela Torres MRN: [id number] DOB: October 06, 1965 Today's Date: 04/04/2024   History of Present Illness   59 y/o female presented to ED on 04/03/24 for syncope x 3. 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 Patient reports being Ind at baseline and living at home with husband. Patient reports multiple syncopal falls after new chemo treatment. Pt needing min A overall for bed mobility and to stand EOB. She reports not feeling well and symptomatic throughout. Session limited secondary to pt feeling unwell in standing. She was able to take a few side steps with min A. Patient will benefit from acute OT to increase overall independence in the areas of ADLs, functional mobility, and safety awareness in order to safely discharge.  Orthostatic BPs   Supine 171/70 HR 105  Sitting 138/73 HR 107  Standing 141/95 HR 106         If plan is discharge home, recommend the following:   A little help with walking and/or transfers;A little help with bathing/dressing/bathroom;Assistance with cooking/housework;Assist for transportation;Help with stairs or ramp for entrance     Functional Status Assessment   Patient has had a recent decline in their functional status and demonstrates the ability to make significant improvements in function in a reasonable and predictable amount of time.     Equipment Recommendations   None recommended by OT;Other (comment) (has all needed equipment.)      Precautions/Restrictions   Precautions Precautions: Fall Recall of Precautions/Restrictions: Intact     Mobility Bed Mobility Overal bed mobility: Needs Assistance Bed Mobility: Supine to Sit, Sit to Supine     Supine  to sit: Supervision Sit to supine: Supervision        Transfers Overall transfer level: Needs assistance Equipment used: 1 person hand held assist Transfers: Sit to/from Stand Sit to Stand: Contact guard assist                  Balance Overall balance assessment: Needs assistance Sitting-balance support: No upper extremity supported, Feet supported Sitting balance-Leahy Scale: Good     Standing balance support: No upper extremity supported Standing balance-Leahy Scale: Fair                             ADL either performed or assessed with clinical judgement      Vision Baseline Vision/History: 1 Wears glasses Patient Visual Report: No change from baseline              Pertinent Vitals/Pain Pain Assessment Pain Assessment: No/denies pain     Extremity/Trunk Assessment Upper Extremity Assessment Upper Extremity Assessment: Generalized weakness   Lower Extremity Assessment Lower Extremity Assessment: Generalized weakness       Communication Communication Communication: No apparent difficulties   Cognition Arousal: Alert Behavior During Therapy: WFL for tasks assessed/performed                                 Following commands: Intact                  Home Living Family/patient expects to be discharged to:: Private residence Living Arrangements: Spouse/significant other Available Help at Discharge: Family Type of Home:  House Home Access: Stairs to enter [employer] of Steps: 2-4 Entrance Stairs-Rails: Right;Left;Can reach both Home Layout: One level     Bathroom Shower/Tub: Walk-in shower         Home Equipment: Agricultural Consultant (2 wheels);BSC/3in1;Wheelchair - manual          Prior Functioning/Environment Prior Level of Function : Independent/Modified Independent                    OT Problem List: Decreased strength;Decreased activity tolerance;Decreased cognition;Impaired balance  (sitting and/or standing);Decreased safety awareness   OT Treatment/Interventions: Self-care/ADL training;Balance training;Therapeutic exercise;Therapeutic activities;Energy conservation;Cognitive remediation/compensation;Patient/family education      OT Goals(Current goals can be found in the care plan section)   Acute Rehab OT Goals Patient Stated Goal: to go home and feel better OT Goal Formulation: With patient Time For Goal Achievement: 04/18/24 Potential to Achieve Goals: Fair ADL Goals Pt Will Perform Grooming: with modified independence;standing Pt Will Perform Lower Body Dressing: with modified independence;sit to/from stand Pt Will Transfer to Toilet: with modified independence;ambulating Pt Will Perform Toileting - Clothing Manipulation and hygiene: with modified independence;sit to/from stand   OT Frequency:  Min 2X/week       AM-PAC OT 6 Clicks Daily Activity     Outcome Measure Help from another person eating meals?: None Help from another person taking care of personal grooming?: A Little Help from another person toileting, which includes using toliet, bedpan, or urinal?: A Little Help from another person bathing (including washing, rinsing, drying)?: A Little Help from another person to put on and taking off regular upper body clothing?: None Help from another person to put on and taking off regular lower body clothing?: A Lot 6 Click Score: 19   End of Session Equipment Utilized During Treatment: Rolling walker (2 wheels) Nurse Communication: Mobility status  Activity Tolerance: Patient tolerated treatment well Patient left: in bed;with call bell/phone within reach;with bed alarm set  OT Visit Diagnosis: Unsteadiness on feet (R26.81);Repeated falls (R29.6);Muscle weakness (generalized) (M62.81)                Time: 8984-8961 OT Time Calculation (min): 23 min Charges:  OT General Charges $OT Visit: 1 Visit OT Evaluation $OT Eval Moderate Complexity: 1  [address], [place], OTR/L , CBIS ascom [phone]  04/04/24, 1:32 PM

## 2024-04-04 NOTE — Telephone Encounter (Signed)
 Patient Product/process Development Scientist completed.    The patient is insured through [employer]. Patient has Medicare and is not eligible for a copay card, but Lahey be able to apply for patient assistance or Medicare RX Payment Plan (Patient Must reach out to their plan, if eligible for payment plan), if available.    Ran test claim for Eliquis  5 mg and the current 30 day co-pay is $12.65.   This test claim was processed through [facility]- copay amounts Gaw vary at other pharmacies due to pharmacy/plan contracts, or as the patient moves through the different stages of their insurance plan.     Reyes Sharps, CPHT Pharmacy Technician Patient Advocate Specialist Lead [facility] Patient Advocate Team Direct Number: [phone]  Fax: [phone]

## 2024-04-04 NOTE — Hospital Course (Addendum)
 Pamela Torres is a 59 y.o. 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  No chest pain, cough, SOB. Per her daughter at the bedside, patient passed out 3 times since yesterday when she was walking at home. Patient has chronic right leg weakness due to sarcoma which has not changed.  Pt has a small wound with pus drainage in the medial side of right leg, close to knee where the sarcoma tumor is located.  Pt is on chemo. Last treatment last week. Pt treated at [facility].    Hospital course / significant events: 02/04: to ED. Tachycardic, soft BP w/ low diastolic but no severe hypotension, on RA, RR intermittently into low 20s but mostly WNL, afebrile. Cr 1.01, troponin 50-40 flat, lactic acid 7.5, Procal 0.27, WBC 11, Hgb 10.7, Neut 7.8, neg COVID/flu/RSV, UA question UTI, pend blood/urine cultures, started on ceftriaxone . EKG w/ apparent atrial flutter with variable conduction, already converted to sinus rhythm after giving IV fluid. CT-head negative. No new focal neurodeficit. Admitted to hospitalist service w/ cardiology consult.  02/05: Cardiology consult - Afib - staring Eliquis , increased metoprolol . Echo --> hyperdynamic fxn, preserved EF, increased RV wall thickness. (+)PT/OT recs for SNF, significant gait instability. 02/06: better ambulation today, cardiology ok for dc w/ Zio monitor to follow outpatient. Pt has f/u upcoming w/ her onc team          Consultants:  Cardiology   Procedures/Surgeries: none      ASSESSMENT & PLAN:   Syncope Etiology is not clear.   Potential differential diagnosis due to cardiac arrhythmia. Patient has chronic right leg weakness due to sarcoma, which has not  changed - no new focal deficit.  CT of head negative.  Echo 12/2023 at [facility] - EF 55, mild LVH, indeterminate diastolic fxn, no significant valvular disease Cardiology consult - see below  Orthostatic VS --> does have some lw BP w/ standing  IV fluids dc PT/OT to see --> home health Pending cultures --> UCx multiple species, BCx NG thus far    Abnormal Echo Question medication effect cardiomyopathy but more likely this would cause reduced systolic function rather than muscular hypertrophy  Hyperdynamic systolic function, preserved EF, increased thickness RV wall  Of note, echo 12/2023 w/ onc team done as baseline w/ Rx Trabectedin, at that time echo no concerns  Will need close follow up but echo findings are not likely to be related to syncope  Pt is aware of echo findings and will discuss further w/ her oncology team   Abnormal EKG: Afib   HR up to 150s.  EKG seems to have atrial flutter with variable conduction.  Converted to sinus rhythm after giving IV fluid.   TSH and free T4 are normal.  Potassium 4.2.  Magnesium 1.8. Increased metoprolol  Eliquis   Echo as above  Zio monitor and outpaitnet f/u cardiology   Lactic acidosis: Lactic acid 7.5 --> 5.9 --> 7.1.  Patient has mild leukocytosis with WBC 11.6, but no fever.  Clinically does not seem to be due to sepsis, as tachycardia has alternative explanation.  Likely due to dehydration. IV fluids NS Trend lactic   UTI (urinary tract infection) Patient does not have symptoms  of UTI, but has positive UA.   Since patient is on chemotherapy, and is immunosuppressed.  Will treat with antibiotics - IV Rocephin  Follow-up urine culture --> mult species Keflex  on discharge    Wound infection_small wound in right leg associated w/ sarcoma: on Rocephin  (patient received 1 dose of vancomycin , cefepime  and Flagyl  in ED) Follow-up blood culture -> NG thus far Keflex  on discharge  wound care   Primary myxofibrosarcoma Patient is receiving  chemotherapy in [facility], last dose was a week ago.  Patient is scheduled for radiation therapy tomorrow. Follow-up with oncology in [facility]   Diabetes mellitus without complication Recent A1c 5.8.  Patient still taking glipizide , metformin  and Rybelsus  at home.   Resume home meds    Myocardial injury:  Troponin 55.  No chest pain.   Likely demand ischemia No concerns at this time    Normocytic anemia  Hemoglobin stable at 10.7 (9.0 on 03/26/2024). Thrombocytopenia: Platelet 125.  Follow CBC   Depression with anxiety Continue home medications  Essential HTN: Blood pressure soft Hold lisinopril Continue metoprolol    OSA CPAP    Class 2 obesity based on BMI: Body mass index is 35.71 kg/m.SABRA Significantly low or high BMI is associated with higher medical risk.  Underweight - under 18  overweight - 25 to 29 obese - 30 or more Class 1 obesity: BMI of 30.0 to 34 Class 2 obesity: BMI of 35.0 to 39 Class 3 obesity: BMI of 40.0 to 49 Super Morbid Obesity: BMI 50-59 Super-super Morbid Obesity: BMI 60+ Healthy nutrition and physical activity advised as adjunct to other disease management and risk reduction treatments

## 2024-04-04 NOTE — Progress Notes (Signed)
*  PRELIMINARY RESULTS* Echocardiogram 2D Echocardiogram has been performed.  Pamela Torres 04/04/2024, 11:21 AM

## 2024-04-04 NOTE — Evaluation (Signed)
 Physical Therapy Evaluation Patient Details Name: Pamela Torres MRN: [id number] DOB: 1966-01-08 Today's Date: 04/04/2024  History of Present Illness  59 y/o female presented to ED on 04/03/24 for syncope x 3. 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 PTA, patient lives with husband and was independent with no AD. Reports 3 falls prior to admission 2/2 syncope. Orthostatics obtained, see below. Symptomatic with transitions especially supine>sit. Required CGA for all mobility this date for safety. Deferred ambulation 2/2 symptoms. Patient reporting feeling funny with transitions. Discussed use of RW for support initially to assist with balance. Patient will benefit from skilled PT services during acute stay to address listed deficits. Patient will benefit from ongoing therapy at discharge to maximize functional independence and safety.   Orthostatic BPs  Supine 171/70 HR 105  Sitting 138/73 HR 107  Standing 141/95 HR 106          If plan is discharge home, recommend the following: Pamela little help with walking and/or transfers;Pamela little help with bathing/dressing/bathroom;Assistance with cooking/housework;Help with stairs or ramp for entrance;Assist for transportation   Can travel by private vehicle        Equipment Recommendations Rolling Pamela Torres (2 wheels)  Recommendations for Other Services       Functional Status Assessment Patient has had Pamela recent decline in their functional status and demonstrates the ability to make significant improvements in function in Pamela reasonable and predictable amount of time.     Precautions / Restrictions Precautions Precautions: Fall Recall of Precautions/Restrictions: Intact Restrictions Weight Bearing Restrictions Per Provider Order: No      Mobility  Bed Mobility Overal bed mobility: Needs Assistance Bed  Mobility: Supine to Sit, Sit to Supine     Supine to sit: Supervision Sit to supine: Supervision   General bed mobility comments: reports feeling funny upon sitting EOB    Transfers Overall transfer level: Needs assistance Equipment used: None Transfers: Sit to/from Stand Sit to Stand: Contact guard assist                Ambulation/Gait                  Stairs            Wheelchair Mobility     Tilt Bed    Modified Rankin (Stroke Patients Only)       Balance Overall balance assessment: Needs assistance Sitting-balance support: No upper extremity supported, Feet supported Sitting balance-Leahy Scale: Good     Standing balance support: No upper extremity supported Standing balance-Leahy Scale: Fair                               Pertinent Vitals/Pain Pain Assessment Pain Assessment: No/denies pain    Home Living Family/patient expects to be discharged to:: Private residence Living Arrangements: Spouse/significant other Available Help at Discharge: Family Type of Home: House Home Access: Stairs to enter Entrance Stairs-Rails: Right;Left;Can reach both Secretary/administrator of Steps: 2-4   Home Layout: One level Home Equipment: Agricultural Consultant (2 wheels);BSC/3in1;Wheelchair - manual      Prior Function Prior Level of Function : Independent/Modified Independent                     Extremity/Trunk Assessment   Upper Extremity Assessment Upper Extremity Assessment: Defer to OT evaluation  Lower Extremity Assessment Lower Extremity Assessment: Generalized weakness       Communication   Communication Communication: No apparent difficulties    Cognition Arousal: Alert Behavior During Therapy: WFL for tasks assessed/performed   PT - Cognitive impairments: No apparent impairments                         Following commands: Intact       Cueing       General Comments      Exercises      Assessment/Plan    PT Assessment Patient needs continued PT services  PT Problem List Decreased strength;Decreased activity tolerance;Decreased balance;Decreased mobility;Cardiopulmonary status limiting activity       PT Treatment Interventions DME instruction;Gait training;Functional mobility training;Therapeutic activities;Therapeutic exercise;Balance training;Neuromuscular re-education;Stair training;Patient/family education    PT Goals (Current goals can be found in the Care Plan section)  Acute Rehab PT Goals Patient Stated Goal: to go home PT Goal Formulation: With patient Time For Goal Achievement: 04/18/24 Potential to Achieve Goals: Good    Frequency Min 2X/week     Co-evaluation               AM-PAC PT 6 Clicks Mobility  Outcome Measure Help needed turning from your back to your side while in Pamela flat bed without using bedrails?: Pamela Little Help needed moving from lying on your back to sitting on the side of Pamela flat bed without using bedrails?: Pamela Little Help needed moving to and from Pamela bed to Pamela chair (including Pamela wheelchair)?: Pamela Little Help needed standing up from Pamela chair using your arms (e.g., wheelchair or bedside chair)?: Pamela Little Help needed to walk in hospital room?: Pamela Little Help needed climbing 3-5 steps with Pamela railing? : Pamela Little 6 Click Score: 18    End of Session   Activity Tolerance: Patient limited by fatigue Patient left: in bed;with call bell/phone within reach Nurse Communication: Mobility status PT Visit Diagnosis: Muscle weakness (generalized) (M62.81);History of falling (Z91.81);Unsteadiness on feet (R26.81)    Time: 8984-8962 PT Time Calculation (min) (ACUTE ONLY): 22 min   Charges:   PT Evaluation $PT Eval Low Complexity: 1 Low   PT General Charges $$ ACUTE PT VISIT: 1 Visit         Pamela Torres, PT, DPT Physical Therapist - [facility]  [facility]   Pamela Torres Pamela Torres 04/04/2024, 1:09 PM

## 2024-04-05 ENCOUNTER — Other Ambulatory Visit: Payer: Self-pay

## 2024-04-05 LAB — CBC
HCT: 24.9 % — ABNORMAL LOW (ref 36.0–46.0)
Hemoglobin: 8.3 g/dL — ABNORMAL LOW (ref 12.0–15.0)
MCH: 32.5 pg (ref 26.0–34.0)
MCHC: 33.3 g/dL (ref 30.0–36.0)
MCV: 97.6 fL (ref 80.0–100.0)
Platelets: 64 10*3/uL — ABNORMAL LOW (ref 150–400)
RBC: 2.55 MIL/uL — ABNORMAL LOW (ref 3.87–5.11)
RDW: 19 % — ABNORMAL HIGH (ref 11.5–15.5)
WBC: 6.9 10*3/uL (ref 4.0–10.5)
nRBC: 0 % (ref 0.0–0.2)

## 2024-04-05 LAB — CULTURE, BLOOD (ROUTINE X 2)
Culture: NO GROWTH
Culture: NO GROWTH
Special Requests: ADEQUATE

## 2024-04-05 LAB — BASIC METABOLIC PANEL WITH GFR
Anion gap: 12 (ref 5–15)
BUN: 17 mg/dL (ref 6–20)
CO2: 21 mmol/L — ABNORMAL LOW (ref 22–32)
Calcium: 7.7 mg/dL — ABNORMAL LOW (ref 8.9–10.3)
Chloride: 104 mmol/L (ref 98–111)
Creatinine, Ser: 0.72 mg/dL (ref 0.44–1.00)
GFR, Estimated: >60 mL/min
Glucose, Bld: 229 mg/dL — ABNORMAL HIGH (ref 70–99)
Potassium: 3.7 mmol/L (ref 3.5–5.1)
Sodium: 137 mmol/L (ref 135–145)

## 2024-04-05 LAB — GLUCOSE, CAPILLARY
Glucose-Capillary: 227 mg/dL — ABNORMAL HIGH (ref 70–99)
Glucose-Capillary: 299 mg/dL — ABNORMAL HIGH (ref 70–99)

## 2024-04-05 LAB — URINE CULTURE

## 2024-04-05 MED ORDER — METOPROLOL SUCCINATE ER 50 MG PO TB24
50.0000 mg | ORAL_TABLET | Freq: Every day | ORAL | 0 refills | Status: AC
  Filled 2024-04-05: qty 30, 30d supply, fill #0

## 2024-04-05 MED ORDER — APIXABAN 5 MG PO TABS
5.0000 mg | ORAL_TABLET | Freq: Two times a day (BID) | ORAL | 0 refills | Status: AC
  Filled 2024-04-05: qty 60, 30d supply, fill #0

## 2024-04-05 MED ORDER — CEPHALEXIN 500 MG PO CAPS
500.0000 mg | ORAL_CAPSULE | Freq: Two times a day (BID) | ORAL | 0 refills | Status: AC
  Filled 2024-04-05: qty 14, 7d supply, fill #0

## 2024-04-05 NOTE — Discharge Instructions (Signed)

## 2024-04-05 NOTE — Progress Notes (Cosign Needed)
 " [facility] CLINIC CARDIOLOGY PROGRESS NOTE       Patient ID: Dayami Taitt Swindler MRN: [id number] DOB/AGE: 1965-03-19 59 y.o.  Admit date: 04/03/2024 Referring Physician Dr. Caleb Exon Primary Physician Halbert Mariano SQUIBB, DO  Primary Cardiologist Dr. Leotis Patterson/Heather Cloria, NP ([facility]) Reason for Consultation Syncope  HPI: Ivis Nicolson Pincock is a 59 y.o. 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 Cardiology was consulted for further evaluation.   Interval history: -Patient seen and examined this AM, resting in hospital bed.  -Echo results reviewed with patient. Denies any CP, SOB, palpitations. HR stable on tele.  -Denies dizziness but has not been OOB much.  Review of systems complete and found to be negative unless listed above    Past Medical History:  Diagnosis Date   Anemia during pregnancy    Anxiety    Chronic back pain    Cyst in hand    on left hand   DVT (deep vein thrombosis) in pregnancy    right leg   Gout    History of kidney stones    Hx of migraines    OSA on CPAP    PAC (premature atrial contraction) 2018   Sarcoma (HCC)    Sciatic pain    T2DM (type 2 diabetes mellitus) (HCC)    Wears glasses     Past Surgical History:  Procedure Laterality Date   ABDOMINAL HYSTERECTOMY     APPLICATION OF WOUND VAC Right 07/29/2021   Procedure: APPLICATION OF WOUND VAC;  Surgeon: Marea Selinda RAMAN, MD;  Location: [facility] ORS;  Service: Vascular;  Laterality: Right;   BACK SURGERY  2014   microdiskectomy - lumbar   CARDIAC CATHETERIZATION     01/19/11 [facility]): normal cononary anatomy, EF 64%   CESAREAN SECTION     INCISION AND DRAINAGE ABSCESS Right 07/29/2021   Procedure: INCISION AND DRAINAGE ABSCESS;  Surgeon:  Marea Selinda RAMAN, MD;  Location: [facility] ORS;  Service: Vascular;  Laterality: Right;   KNEE SURGERY Right    arthroscopy   LUMBAR LAMINECTOMY/DECOMPRESSION MICRODISCECTOMY Right 12/10/2013   Procedure: Right Lumbar Five to Sacral One Redo Microdiskectomy;  Surgeon: Fairy Levels, MD;  Location: [facility] NEUR[facility] ORS;  Service: Neurosurgery;  Laterality: Right;  Right L5-S1 Redo Microdiskectomy   MAXIMUM ACCESS (MAS)POSTERIOR LUMBAR INTERBODY FUSION (PLIF) 1 LEVEL N/A 06/26/2014   Procedure: Lumbar five sacral one Maximum Access Posterior Lumbar Interbody Fusion;  Surgeon: Fairy Levels, MD;  Location: [facility] NEURO ORS;  Service: Neurosurgery;  Laterality: N/A;   ROTATOR CUFF REPAIR Right    TRANSFORAMINAL LUMBAR INTERBODY FUSION (TLIF) WITH PEDICLE SCREW FIXATION 1 LEVEL N/A 07/28/2020   Procedure: Lumbar Four-Five Transforaminal lumbar interbody fusion with exploration of Lumbar Five- Sacral One Fusion;  Surgeon: Levels Fairy, MD;  Location: [facility] OR;  Service: Neurosurgery;  Laterality: N/A;  Lumbar Four-Five Transforaminal lumbar interbody fusion with exploration of Lumbar Five- Sacral One Fusion    Medications Prior to Admission  Medication Sig Dispense Refill Last Dose/Taking   allopurinol  (ZYLOPRIM ) 100 MG tablet Take 100 mg by mouth daily.   04/03/2024 Morning   DULoxetine  (CYMBALTA ) 60 MG capsule Take 60 mg by mouth daily.   04/03/2024 Morning   gabapentin  (NEURONTIN ) 300 MG capsule Take 300-600 mg by mouth See admin instructions. Take  300 mg by mouth in the morning and afternoon and 600 mg at bedtime   04/03/2024 Morning   HYDROmorphone  (DILAUDID ) 4 MG tablet Take 4 mg by mouth every 4 (four) hours as needed.   04/03/2024 Morning   hydrOXYzine  (ATARAX ) 25 MG tablet Take 25 mg by mouth 3 (three) times daily as needed for anxiety.   04/03/2024 Morning   lisinopril (ZESTRIL) 2.5 MG tablet Take 2.5 mg by mouth daily.   04/03/2024 Morning   metFORMIN  (GLUCOPHAGE ) 500 MG tablet Take 500 mg by mouth 2 (two) times daily.   04/03/2024  Morning   metoprolol  succinate (TOPROL -XL) 25 MG 24 hr tablet Take 25 mg by mouth daily.   04/03/2024 Morning   OLANZapine  (ZYPREXA ) 2.5 MG tablet Take 2.5 mg by mouth at bedtime.   04/02/2024 Bedtime   ondansetron  (ZOFRAN -ODT) 4 MG disintegrating tablet Take 1 tablet (4 mg total) by mouth every 8 (eight) hours as needed for nausea or vomiting. 12 tablet 0 04/03/2024 Morning   RYBELSUS  3 MG TABS Take 1 tablet by mouth daily.   04/03/2024 Morning   VASCEPA  1 g capsule Take 2 g by mouth 2 (two) times daily.   04/03/2024 Morning   Cholecalciferol  (VITAMIN D3 PO) Take 1 capsule by mouth daily. (Patient not taking: Reported on 04/03/2024)   Not Taking   doxycycline  (VIBRAMYCIN ) 100 MG capsule Take 1 capsule (100 mg total) by mouth 2 (two) times daily. (Patient not taking: Reported on 04/03/2024) 20 capsule 0 Not Taking   glipiZIDE  (GLUCOTROL ) 5 MG tablet Take 1 tablet (5 mg total) by mouth daily for 7 days. 7 tablet 0    sertraline  (ZOLOFT ) 50 MG tablet Take 50 mg by mouth daily. (Patient not taking: Reported on 04/03/2024)   Not Taking   traMADol  (ULTRAM ) 50 MG tablet Take 1 tablet (50 mg total) by mouth every 6 (six) hours as needed. (Patient not taking: Reported on 04/03/2024) 20 tablet 0 Not Taking   Social History   Socioeconomic History   Marital status: Married    Spouse name: Not on file   Number of children: Not on file   Years of education: Not on file   Highest education level: Not on file  Occupational History   Not on file  Tobacco Use   Smoking status: Never   Smokeless tobacco: Never  Vaping Use   Vaping status: Never Used  Substance and Sexual Activity   Alcohol use: No   Drug use: No   Sexual activity: Not on file  Other Topics Concern   Not on file  Social History Narrative   Lives at home with husband.    Social Drivers of Health   Tobacco Use: Low Risk (04/03/2024)   Patient History    Smoking Tobacco Use: Never    Smokeless Tobacco Use: Never    Passive Exposure: Not on file   Financial Resource Strain: Low Risk  (10/04/2023)   Received from [facility]   Overall Financial Resource Strain (CARDIA)    Difficulty of Paying Living Expenses: Not very hard  Recent Concern: Financial Resource Strain - Medium Risk (08/06/2023)   Received from [facility]   Overall Financial Resource Strain (CARDIA)    Difficulty of Paying Living Expenses: Somewhat hard  Food Insecurity: No Food Insecurity (10/04/2023)   Received from [facility]   Epic    Within the past 12 months, you worried that your food would run out before you got  the money to buy more.: Never true    Within the past 12 months, the food you bought just didn't last and you didn't have money to get more.: Never true  Recent Concern: Food Insecurity - Food Insecurity Present (08/06/2023)   Received from [facility]   Epic    Within the past 12 months, you worried that your food would run out before you got the money to buy more.: Sometimes true    Within the past 12 months, the food you bought just didn't last and you didn't have money to get more.: Often true  Transportation Needs: No Transportation Needs (10/04/2023)   Received from [facility] - Transportation    In the past 12 months, has lack of transportation kept you from medical appointments or from getting medications?: No    Lack of Transportation (Non-Medical): No  Physical Activity: Not on file  Stress: Not on file  Social Connections: Not on file  Intimate Partner Violence: Not on file  Depression (EYV7-0): Not on file  Alcohol Screen: Not on file  Housing: Low Risk  (03/06/2024)   Received from [facility]   Epic    In the last 12 months, was there a time when you were not able to pay the mortgage or rent on time?: No    In the past 12 months, how many times have you moved where you were living?: 0    At any time in the past 12 months,  were you homeless or living in a shelter (including now)?: No  Utilities: Not At Risk (09/28/2023)   Received from [facility]   Epic    In the past 12 months has the electric, gas, oil, or water company threatened to shut off services in your home?: No  Health Literacy: Not on file    Family History  Problem Relation Age of Onset   Thyroid disease Mother    COPD Mother    Emphysema Mother    Diabetes Father    Heart attack Father    Leukemia Father    Hypertension Brother    Diabetes Brother    Diabetes Brother    Hypertension Brother    Diabetes Brother    Breast cancer Maternal Aunt      Vitals:   04/05/24 0435 04/05/24 0500 04/05/24 0620 04/05/24 0845  BP:  126/70 (!) 157/78 (!) 144/66  Pulse:  96 98 98  Resp:  11 18 16   Temp: 98.3 F (36.8 C)  97.7 F (36.5 C) 98 F (36.7 C)  TempSrc: Oral  Oral   SpO2:  95% 100% 100%  Weight:   86.8 kg   Height:   5' 2 (1.575 m)     PHYSICAL EXAM General: Chronically ill appearing female, well nourished, in no acute distress. HEENT: Normocephalic and atraumatic. Neck: No JVD.  Lungs: Normal respiratory effort on room air. Clear bilaterally to auscultation. No wheezes, crackles, rhonchi.  Heart: HRRR. Normal S1 and S2 without gallops or murmurs.  Abdomen: Non-distended appearing.  Msk: Normal strength and tone for age. Extremities: Warm and well perfused. No clubbing, cyanosis. No edema.  Neuro: Alert and oriented X 3. Psych: Answers questions appropriately.   Labs: Basic Metabolic Panel: Recent Labs    04/03/24 1535 04/04/24 0323 04/05/24 0359  NA 133* 138 137  K 4.2 3.8 3.7  CL 94* 106 104  CO2 16* 18* 21*  GLUCOSE 295* 336*  229*  BUN 26* 21* 17  CREATININE 1.01* 0.84 0.72  CALCIUM 9.3 7.2* 7.7*  MG 1.8  --   --   PHOS 3.1  --   --    Liver Function Tests: Recent Labs    04/03/24 1535  AST 27  ALT 57*  ALKPHOS 117  BILITOT 0.3  PROT 6.6  ALBUMIN  4.0   No results for input(s):  LIPASE, AMYLASE in the last 72 hours. CBC: Recent Labs    04/03/24 1535 04/04/24 0323 04/05/24 0359  WBC 11.6* 8.8 6.9  NEUTROABS 7.8*  --   --   HGB 10.7* 8.3* 8.3*  HCT 31.5* 24.4* 24.9*  MCV 95.2 96.8 97.6  PLT 125* 76* 64*   Cardiac Enzymes: No results for input(s): CKTOTAL, CKMB, CKMBINDEX, TROPONINIHS in the last 72 hours. BNP: No results for input(s): BNP in the last 72 hours. D-Dimer: No results for input(s): DDIMER in the last 72 hours. Hemoglobin A1C: No results for input(s): HGBA1C in the last 72 hours. Fasting Lipid Panel: No results for input(s): CHOL, HDL, LDLCALC, TRIG, CHOLHDL, LDLDIRECT in the last 72 hours. Thyroid Function Tests: Recent Labs    04/03/24 2119  TSH 3.640   Anemia Panel: No results for input(s): VITAMINB12, FOLATE, FERRITIN, TIBC, IRON, RETICCTPCT in the last 72 hours.   Radiology: ECHOCARDIOGRAM COMPLETE Result Date: 04/04/2024    ECHOCARDIOGRAM REPORT   Patient Name:   FRANCEE SETZER Ascher Date of Exam: 04/04/2024 Medical Rec #:  [id number]     Height:       61.0 in Accession #:    [id number]    Weight:       189.0 lb Date of Birth:  August 30, 1965     BSA:          1.844 m Patient Age:    58 years      BP:           132/67 mmHg Patient Gender: F             HR:           103 bpm. Exam Location:  [facility] Procedure: 2D Echo, Cardiac Doppler and Color Doppler (Both Spectral and Color            Flow Doppler were utilized during procedure). Indications:     Atrial Fibrillation I48.91  History:         Patient has no prior history of Echocardiogram examinations.                  Risk Factors:Diabetes. Anxiety, migraines.  Sonographer:     Christopher Furnace Referring Phys:  [id number] [place] Diagnosing Phys: Cara JONETTA Lovelace MD  Sonographer Comments: Technically challenging study due to limited acoustic windows, no apical window and suboptimal subcostal window. IMPRESSIONS  1. Left ventricular ejection fraction, by estimation, is  70 to 75%. The left ventricle has hyperdynamic function. The left ventricle has no regional wall motion abnormalities. There is moderate asymmetric left ventricular hypertrophy of the septal segment. Left ventricular diastolic function could not be evaluated.  2. Right ventricular systolic function is normal. The right ventricular size is mildly enlarged. Severely increased right ventricular wall thickness.  3. Left atrial size was mildly dilated.  4. Right atrial size was mildly dilated.  5. The mitral valve is normal in structure. No evidence of mitral valve regurgitation.  6. The aortic valve is normal in structure. Aortic valve regurgitation is not visualized. FINDINGS  Left Ventricle: Left ventricular  ejection fraction, by estimation, is 70 to 75%. The left ventricle has hyperdynamic function. The left ventricle has no regional wall motion abnormalities. Strain was performed and the global longitudinal strain is indeterminate. The left ventricular internal cavity size was normal in size. There is moderate asymmetric left ventricular hypertrophy of the septal segment. Left ventricular diastolic function could not be evaluated. Right Ventricle: The right ventricular size is mildly enlarged. Severely increased right ventricular wall thickness. Right ventricular systolic function is normal. Left Atrium: Left atrial size was mildly dilated. Right Atrium: Right atrial size was mildly dilated. Pericardium: Trivial pericardial effusion is present. Mitral Valve: The mitral valve is normal in structure. No evidence of mitral valve regurgitation. Tricuspid Valve: The tricuspid valve is normal in structure. Tricuspid valve regurgitation is trivial. Aortic Valve: The aortic valve is normal in structure. Aortic valve regurgitation is not visualized. Pulmonic Valve: The pulmonic valve was normal in structure. Pulmonic valve regurgitation is not visualized. Aorta: The aortic root was not well visualized. IAS/Shunts: No atrial  level shunt detected by color flow Doppler. Additional Comments: 3D was performed not requiring image post processing on an independent workstation and was indeterminate.  LEFT VENTRICLE PLAX 2D LVIDd:         2.89 cm LVIDs:         1.69 cm LV PW:         1.21 cm LV IVS:        3.66 cm LVOT diam:     2.00 cm LVOT Area:     3.14 cm  LEFT ATRIUM         Index LA diam:    4.30 cm 2.33 cm/m   AORTA Ao Root diam: 3.50 cm  SHUNTS Systemic Diam: 2.00 cm Dwayne D Callwood MD Electronically signed by Cara JONETTA Lovelace MD Signature Date/Time: 04/04/2024/12:31:15 PM    Final    CT HEAD WO CONTRAST ( ) Result Date: 04/03/2024 EXAM: CT HEAD WITHOUT CONTRAST 04/03/2024 11:42:24 PM TECHNIQUE: CT of the head was performed without the administration of intravenous contrast. Automated exposure control, iterative reconstruction, and/or weight based adjustment of the mA/kV was utilized to reduce the radiation dose to as low as reasonably achievable. COMPARISON: None available. CLINICAL HISTORY: Syncope/presyncope; cerebrovascular cause suspected. FINDINGS: BRAIN AND VENTRICLES: Normal brain volume for age. No acute hemorrhage. No evidence of acute infarct. No hydrocephalus. No extra-axial collection. No mass effect or midline shift. ORBITS: No acute abnormality. SINUSES: No acute abnormality. SOFT TISSUES AND SKULL: No acute soft tissue abnormality. No skull fracture. IMPRESSION: 1. No acute intracranial abnormality. Electronically signed by: Norman Gatlin MD 04/03/2024 11:50 PM EST RP Workstation: [id number]   DG Chest Port 1 View Result Date: 04/03/2024 EXAM: 1 VIEW XRAY OF THE CHEST 04/03/2024 03:55:00 PM COMPARISON: 05/27/2013 CLINICAL HISTORY: Query sepsis; evaluate for abnormality. FINDINGS: LINES, TUBES AND DEVICES: Right IJ port catheter in place tip at the cavoatrial junction. LUNGS AND PLEURA: No focal pulmonary opacity. No pleural effusion. No pneumothorax. HEART AND MEDIASTINUM: Mild cardiomegaly. No acute  abnormality of the mediastinal silhouette. BONES AND SOFT TISSUES: Thoracic degenerative changes. No acute osseous abnormality. IMPRESSION: 1. No acute cardiopulmonary abnormality. Electronically signed by: Rogelia Myers MD 04/03/2024 04:29 PM EST RP Workstation: [id number]    ECHO as above  TELEMETRY (personally reviewed): sinus rhythm PACs rate 90s  EKG (personally reviewed): AF RVR rate 153 bpm  Data reviewed by me 04/05/2024: last 24h vitals tele labs imaging I/O ED provider note, admission H&P, hospitalist progress note  Principal  Problem:   Syncope Active Problems:   Primary myxofibrosarcoma (HCC)   Diabetes mellitus without complication (HCC)   Obesity (BMI 30-39.9)   Depression with anxiety   Normocytic anemia   Thrombocytopenia   Lactic acidosis   Myocardial injury   Abnormal EKG   Wound infection_small wound in right leg   UTI (urinary tract infection)   HTN (hypertension)   OSA on CPAP    ASSESSMENT AND PLAN:  Letrice G Siegel is a 59 y.o. 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 Cardiology was consulted for further evaluation.   # Syncope # Atrial fibrillation RVR, new onset # Paroxysmal atrial fibrillation # Myxofibrosarcoma # Hx stress-induced cardiomyopathy Patient presented with complaints of palpitations and 3 syncopal episodes over the last week. Initially found to be in AF RVR on EKG which resolved after IV fluids. Concern for dehydration. Currently undergoing chemotherapy at [facility]. Echo this admission with EF 70-75%, no WMAs, moderate asymmetric LVH, RV thickness noted. -Continue eliquis  5 mg twice daily for stroke risk reduction with close monitoring of Hgb. No other apparent  contraindication at this time.  -Continue metoprolol  succinate to 50 mg daily. Consider further uptitration outpatient. -Minimal and flat troponin most consistent with demand/supply mismatch and not ACS.  -Will place cardiac monitor today for additional evaluation of heart rate/rhythm on discharge. Echo reassuring with no evidence of chemotherapy-induced cardiomyopathy.  Ok for discharge today from a cardiac perspective. Will arrange for follow up in clinic with primary cardiologist at [facility] in 1-2 weeks.    This patient's plan of care was discussed and created with Dr. Florencio and he is in agreement.  Signed: Danita Bloch, PA-C  04/05/2024, 9:28 AM [facility] Cardiology      "

## 2024-04-05 NOTE — Discharge Summary (Signed)
 "    Physician Discharge Summary   Patient: Pamela Torres MRN: [id number]  DOB: 1965/09/04   Admit:     Date of Admission: 04/03/2024 Admitted from: home   Discharge: Date of discharge: 04/05/24 Disposition: Home Condition at discharge: good  CODE STATUS: FULL CODE     Discharge Physician: Laneta Blunt, DO [facility]     PCP: Halbert Mariano SQUIBB, DO  Recommendations for Outpatient Follow-up:  Follow up with PCP Mullis, Kiersten P, DO in 1-2 weeks Follow up as directed w/ oncology and cardiology - would call onc team and confirm upcoming appt / see if they can see her sooner but she has appt scheduled      Discharge Diagnoses: Principal Problem:   Syncope Active Problems:   Abnormal EKG   Lactic acidosis   UTI (urinary tract infection)   Wound infection_small wound in right leg   Primary myxofibrosarcoma (HCC)   Diabetes mellitus without complication (HCC)   Myocardial injury   Normocytic anemia   Thrombocytopenia   Depression with anxiety   Obesity (BMI 30-39.9)   HTN (hypertension)   OSA on CPAP        Pamela Torres is a 59 y.o. 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  No chest pain, cough, SOB. Per her daughter at the bedside, patient passed out 3 times since yesterday when she was walking at home. Patient has chronic right leg weakness due to sarcoma which has not changed.  Pt has a small wound with pus drainage in the medial side of right leg, close to knee where the sarcoma tumor is located.  Pt is on chemo. Last treatment last week. Pt treated at [facility].   Hospital course / significant events: 02/04: to ED. Tachycardic, soft BP w/ low diastolic but no  severe hypotension, on RA, RR intermittently into low 20s but mostly WNL, afebrile. Cr 1.01, troponin 50-40 flat, lactic acid 7.5, Procal 0.27, WBC 11, Hgb 10.7, Neut 7.8, neg COVID/flu/RSV, UA question UTI, pend blood/urine cultures, started on ceftriaxone . EKG w/ apparent atrial flutter with variable conduction, already converted to sinus rhythm after giving IV fluid. CT-head negative. No new focal neurodeficit. Admitted to hospitalist service w/ cardiology consult.  02/05: Cardiology consult - Afib - staring Eliquis , increased metoprolol . Echo --> hyperdynamic fxn, preserved EF, increased RV wall thickness. (+)PT/OT recs for SNF, significant gait instability. 02/06: better ambulation today, cardiology ok for dc w/ Zio monitor to follow outpatient. Pt has f/u upcoming w/ her onc team   Consultants:  Cardiology   Procedures/Surgeries: none    ASSESSMENT & PLAN:   Syncope Etiology is not clear.   Potential differential diagnosis due to cardiac arrhythmia. Patient has chronic right leg weakness due to sarcoma, which has not changed - no new focal deficit.  CT of head negative.  Echo 12/2023 at [facility] - EF 55, mild LVH, indeterminate diastolic fxn, no significant valvular disease Cardiology consult - see below  Orthostatic VS --> does have some lw BP w/ standing  IV fluids dc PT/OT to see --> home health Pending cultures --> UCx multiple species, BCx NG thus far    Abnormal Echo Question medication effect cardiomyopathy but  more likely this would cause reduced systolic function rather than muscular hypertrophy  Hyperdynamic systolic function, preserved EF, increased thickness RV wall  Of note, echo 12/2023 w/ onc team done as baseline w/ Rx Trabectedin, at that time echo no concerns  Will need close follow up but echo findings are not likely to be related to syncope  Pt is aware of echo findings and will discuss further w/ her oncology team   Abnormal EKG: Afib   HR up to 150s.  EKG seems  to have atrial flutter with variable conduction.  Converted to sinus rhythm after giving IV fluid.   TSH and free T4 are normal.  Potassium 4.2.  Magnesium 1.8. Increased metoprolol  Eliquis   Echo as above  Zio monitor and outpaitnet f/u cardiology   Lactic acidosis: Lactic acid 7.5 --> 5.9 --> 7.1.  Patient has mild leukocytosis with WBC 11.6, but no fever.  Clinically does not seem to be due to sepsis, as tachycardia has alternative explanation.  Likely due to dehydration. IV fluids NS Trend lactic   UTI (urinary tract infection) Patient does not have symptoms of UTI, but has positive UA.   Since patient is on chemotherapy, and is immunosuppressed.  Will treat with antibiotics - IV Rocephin  Follow-up urine culture --> mult species Keflex  on discharge    Wound infection_small wound in right leg associated w/ sarcoma: on Rocephin  (patient received 1 dose of vancomycin , cefepime  and Flagyl  in ED) Follow-up blood culture -> NG thus far Keflex  on discharge  wound care   Primary myxofibrosarcoma Patient is receiving chemotherapy in [facility], last dose was a week ago.  Patient is scheduled for radiation therapy tomorrow. Follow-up with oncology in [facility]   Diabetes mellitus without complication Recent A1c 5.8.  Patient still taking glipizide , metformin  and Rybelsus  at home.   Resume home meds    Myocardial injury:  Troponin 55.  No chest pain.   Likely demand ischemia No concerns at this time    Normocytic anemia  Hemoglobin stable at 10.7 (9.0 on 03/26/2024). Thrombocytopenia: Platelet 125.  Follow CBC   Depression with anxiety Continue home medications  Essential HTN: Blood pressure soft Hold lisinopril Continue metoprolol    OSA CPAP    Class 2 obesity based on BMI: Body mass index is 35.71 kg/m.SABRA Significantly low or high BMI is associated with higher medical risk.  Underweight - under 18  overweight - 25 to 29 obese - 30 or more Class 1 obesity: BMI of 30.0 to  34 Class 2 obesity: BMI of 35.0 to 39 Class 3 obesity: BMI of 40.0 to 49 Super Morbid Obesity: BMI 50-59 Super-super Morbid Obesity: BMI 60+ Healthy nutrition and physical activity advised as adjunct to other disease management and risk reduction treatments           Discharge Instructions  Allergies as of 04/05/2024       Reactions   Simvastatin Other (See Comments)   Leg cramps         Medication List     STOP taking these medications    doxycycline  100 MG capsule Commonly known as: VIBRAMYCIN    sertraline  50 MG tablet Commonly known as: ZOLOFT    traMADol  50 MG tablet Commonly known as: ULTRAM    VITAMIN D3 PO       TAKE these medications    allopurinol  100 MG tablet Commonly known as: ZYLOPRIM  Take 100 mg by mouth daily.   apixaban  5 MG Tabs tablet Commonly known as: ELIQUIS  Take 1 tablet (5  mg total) by mouth 2 (two) times daily.   cephALEXin  500 MG capsule Commonly known as: KEFLEX  Take 1 capsule (500 mg total) by mouth 2 (two) times daily.   DULoxetine  60 MG capsule Commonly known as: CYMBALTA  Take 60 mg by mouth daily.   gabapentin  300 MG capsule Commonly known as: NEURONTIN  Take 300-600 mg by mouth See admin instructions. Take 300 mg by mouth in the morning and afternoon and 600 mg at bedtime   glipiZIDE  5 MG tablet Commonly known as: Glucotrol  Take 1 tablet (5 mg total) by mouth daily for 7 days.   HYDROmorphone  4 MG tablet Commonly known as: DILAUDID  Take 4 mg by mouth every 4 (four) hours as needed.   hydrOXYzine  25 MG tablet Commonly known as: ATARAX  Take 25 mg by mouth 3 (three) times daily as needed for anxiety.   lisinopril 2.5 MG tablet Commonly known as: ZESTRIL Take 2.5 mg by mouth daily.   metFORMIN  500 MG tablet Commonly known as: GLUCOPHAGE  Take 500 mg by mouth 2 (two) times daily.   metoprolol  succinate 50 MG 24 hr tablet Commonly known as: TOPROL -XL Take 1 tablet (50 mg total) by mouth daily. What changed:   medication strength how much to take   OLANZapine  2.5 MG tablet Commonly known as: ZYPREXA  Take 2.5 mg by mouth at bedtime.   ondansetron  4 MG disintegrating tablet Commonly known as: ZOFRAN -ODT Take 1 tablet (4 mg total) by mouth every 8 (eight) hours as needed for nausea or vomiting.   Rybelsus  3 MG Tabs Generic drug: Semaglutide  Take 1 tablet by mouth daily.   Vascepa  1 g capsule Generic drug: icosapent  Ethyl Take 2 g by mouth 2 (two) times daily.               Durable Medical Equipment  (From admission, onward)           Start     Ordered   04/05/24 0922  DME Walker rolling  (Discharge Planning)  Once       Question Answer Comment  Walker: With 5 Inch Wheels   Patient needs a walker to treat with the following condition A-fib [facility])   Patient needs a walker to treat with the following condition Cancer of leg (HCC)      04/05/24 9078             Contact information for follow-up providers     Jakie Leach, MD. Go in 1 week(s).   Specialty: Cardiology Contact information: [address] [place] [address] [phone]              Contact information for after-discharge care     Home Medical Care     [facility]) .   Service: Home Health Services                     Allergies[1]   Subjective: pt feelin gbetter today, would like to get up walking some more. No fever/chills, pain controlled, no dizziness at this time but still some orthostatic lightheadedness. Tolerating diet. No CP/SOB   Discharge Exam: BP (!) 107/50 (BP Location: Left Arm)   Pulse (!) 47   Temp 97.8 F (36.6 C)   Resp 16   Ht 5' 2 (1.575 m)   Wt 86.8 kg   SpO2 97%   BMI 35.00 kg/m  General: Pt is alert, awake, not in acute distress Cardiovascular:  irreg rate but no tachycardic, irreg rhythm, S1/S2 +,  no rubs, no gallops Respiratory: CTA bilaterally, no wheezing, no rhonchi Abdominal: Soft, NT, ND, bowel  sounds + Extremities: no edema, no cyanosis     The results of significant diagnostics from this hospitalization (including imaging, microbiology, ancillary and laboratory) are listed below for reference.     Microbiology: Recent Results (from the past 240 hours)  Blood Culture (routine x 2)     Status: None (Preliminary result)   Collection Time: 04/03/24  3:35 PM   Specimen: BLOOD  Result Value Ref Range Status   Specimen Description BLOOD BLOOD RIGHT FOREARM  Final   Special Requests   Final    BOTTLES DRAWN AEROBIC AND ANAEROBIC Blood Culture adequate volume   Culture   Final    NO GROWTH 2 DAYS Performed at [facility], [address], [place], [place] [address]    Report Status PENDING  Incomplete  Blood Culture (routine x 2)     Status: None (Preliminary result)   Collection Time: 04/03/24  4:00 PM   Specimen: BLOOD  Result Value Ref Range Status   Specimen Description BLOOD BLOOD LEFT FOREARM  Final   Special Requests   Final    BOTTLES DRAWN AEROBIC AND ANAEROBIC Blood Culture results [place] not be optimal due to an inadequate volume of blood received in culture bottles   Culture   Final    NO GROWTH 2 DAYS Performed at [facility], [address], [place], [place] [address]    Report Status PENDING  Incomplete  Resp panel by RT-PCR (RSV, Flu A&B, Covid) Anterior Nasal Swab     Status: None   Collection Time: 04/03/24  4:35 PM   Specimen: Anterior Nasal Swab  Result Value Ref Range Status   SARS Coronavirus 2 by RT PCR NEGATIVE NEGATIVE Final    Comment: (NOTE) SARS-CoV-2 target nucleic acids are NOT DETECTED.  The SARS-CoV-2 RNA is generally detectable in upper respiratory specimens during the acute phase of infection. The lowest concentration of SARS-CoV-2 viral copies this assay can detect is 138 copies/mL. A negative result does not preclude SARS-Cov-2 infection and should not be used as the sole basis for treatment or other patient  management decisions. A negative result Noffsinger occur with  improper specimen collection/handling, submission of specimen other than nasopharyngeal swab, presence of viral mutation(s) within the areas targeted by this assay, and inadequate number of viral copies(<138 copies/mL). A negative result must be combined with clinical observations, patient history, and epidemiological information. The expected result is Negative.  Fact Sheet for Patients:  bloggercourse.com  Fact Sheet for Healthcare Providers:  seriousbroker.it  This test is no t yet approved or cleared by the [place]  FDA and  has been authorized for detection and/or diagnosis of SARS-CoV-2 by FDA under an Emergency Use Authorization (EUA). This EUA will remain  in effect (meaning this test can be used) for the duration of the COVID-19 declaration under Section 564(b)(1) of the Act, 21 U.S.C.section 360bbb-3(b)(1), unless the authorization is terminated  or revoked sooner.       Influenza A by PCR NEGATIVE NEGATIVE Final   Influenza B by PCR NEGATIVE NEGATIVE Final    Comment: (NOTE) The Xpert Xpress SARS-CoV-2/FLU/RSV plus assay is intended as an aid in the diagnosis of influenza from Nasopharyngeal swab specimens and should not be used as a sole basis for treatment. Nasal washings and aspirates are unacceptable for Xpert Xpress SARS-CoV-2/FLU/RSV testing.  Fact Sheet for Patients: bloggercourse.com  Fact Sheet for Healthcare Providers:  seriousbroker.it  This test is not yet approved or cleared by the [place]  FDA and has been authorized for detection and/or diagnosis of SARS-CoV-2 by FDA under an Emergency Use Authorization (EUA). This EUA will remain in effect (meaning this test can be used) for the duration of the COVID-19 declaration under Section 564(b)(1) of the Act, 21 U.S.C. section 360bbb-3(b)(1),  unless the authorization is terminated or revoked.     Resp Syncytial Virus by PCR NEGATIVE NEGATIVE Final    Comment: (NOTE) Fact Sheet for Patients: bloggercourse.com  Fact Sheet for Healthcare Providers: seriousbroker.it  This test is not yet approved or cleared by the [place]  FDA and has been authorized for detection and/or diagnosis of SARS-CoV-2 by FDA under an Emergency Use Authorization (EUA). This EUA will remain in effect (meaning this test can be used) for the duration of the COVID-19 declaration under Section 564(b)(1) of the Act, 21 U.S.C. section 360bbb-3(b)(1), unless the authorization is terminated or revoked.  Performed at [facility], [address], [place], [place] [address]   Urine Culture     Status: Abnormal   Collection Time: 04/03/24  9:19 PM   Specimen: Urine, Random  Result Value Ref Range Status   Specimen Description   Final    URINE, RANDOM Performed at [facility], [address] [place], [place] [address]    Special Requests   Final    NONE Reflexed from [id number] Performed at [facility], [address] [place], [place] [address]    Culture MULTIPLE SPECIES PRESENT, SUGGEST RECOLLECTION (A)  Final   Report Status 04/05/2024 FINAL  Final     Labs: BNP (last 3 results) No results for input(s): BNP in the last 8760 hours. Basic Metabolic Panel: Recent Labs  Lab 04/03/24 1535 04/04/24 0323 04/05/24 0359  NA 133* 138 137  K 4.2 3.8 3.7  CL 94* 106 104  CO2 16* 18* 21*  GLUCOSE 295* 336* 229*  BUN 26* 21* 17  CREATININE 1.01* 0.84 0.72  CALCIUM 9.3 7.2* 7.7*  MG 1.8  --   --   PHOS 3.1  --   --    Liver Function Tests: Recent Labs  Lab 04/03/24 1535  AST 27  ALT 57*  ALKPHOS 117  BILITOT 0.3  PROT 6.6  ALBUMIN  4.0   No results for input(s): LIPASE, AMYLASE in the last 168 hours. No results for input(s): AMMONIA in the last 168  hours. CBC: Recent Labs  Lab 04/03/24 1535 04/04/24 0323 04/05/24 0359  WBC 11.6* 8.8 6.9  NEUTROABS 7.8*  --   --   HGB 10.7* 8.3* 8.3*  HCT 31.5* 24.4* 24.9*  MCV 95.2 96.8 97.6  PLT 125* 76* 64*   Cardiac Enzymes: No results for input(s): CKTOTAL, CKMB, CKMBINDEX, TROPONINI in the last 168 hours. BNP: Invalid input(s): POCBNP CBG: Recent Labs  Lab 04/04/24 1241 04/04/24 1745 04/04/24 2128 04/05/24 0808 04/05/24 1205  GLUCAP 229* 166* 220* 227* 299*   D-Dimer No results for input(s): DDIMER in the last 72 hours. Hgb A1c No results for input(s): HGBA1C in the last 72 hours. Lipid Profile No results for input(s): CHOL, HDL, LDLCALC, TRIG, CHOLHDL, LDLDIRECT in the last 72 hours. Thyroid function studies Recent Labs    04/03/24 2119  TSH 3.640   Anemia work up No results for input(s): VITAMINB12, FOLATE, FERRITIN, TIBC, IRON, RETICCTPCT in the last 72 hours. Urinalysis    Component Value Date/Time   COLORURINE YELLOW (A) 04/03/2024 2119  APPEARANCEUR HAZY (A) 04/03/2024 2119   LABSPEC 1.023 04/03/2024 2119   PHURINE 5.0 04/03/2024 2119   GLUCOSEU 150 (A) 04/03/2024 2119   HGBUR NEGATIVE 04/03/2024 2119   BILIRUBINUR NEGATIVE 04/03/2024 2119   KETONESUR NEGATIVE 04/03/2024 2119   PROTEINUR NEGATIVE 04/03/2024 2119   NITRITE NEGATIVE 04/03/2024 2119   LEUKOCYTESUR LARGE (A) 04/03/2024 2119   Sepsis Labs Recent Labs  Lab 04/03/24 1535 04/04/24 0323 04/05/24 0359  WBC 11.6* 8.8 6.9   Microbiology Recent Results (from the past 240 hours)  Blood Culture (routine x 2)     Status: None (Preliminary result)   Collection Time: 04/03/24  3:35 PM   Specimen: BLOOD  Result Value Ref Range Status   Specimen Description BLOOD BLOOD RIGHT FOREARM  Final   Special Requests   Final    BOTTLES DRAWN AEROBIC AND ANAEROBIC Blood Culture adequate volume   Culture   Final    NO GROWTH 2 DAYS Performed at [facility],  [address] [place], [place] [address]    Report Status PENDING  Incomplete  Blood Culture (routine x 2)     Status: None (Preliminary result)   Collection Time: 04/03/24  4:00 PM   Specimen: BLOOD  Result Value Ref Range Status   Specimen Description BLOOD BLOOD LEFT FOREARM  Final   Special Requests   Final    BOTTLES DRAWN AEROBIC AND ANAEROBIC Blood Culture results [place] not be optimal due to an inadequate volume of blood received in culture bottles   Culture   Final    NO GROWTH 2 DAYS Performed at [facility], [address] [place], [place] [address]    Report Status PENDING  Incomplete  Resp panel by RT-PCR (RSV, Flu A&B, Covid) Anterior Nasal Swab     Status: None   Collection Time: 04/03/24  4:35 PM   Specimen: Anterior Nasal Swab  Result Value Ref Range Status   SARS Coronavirus 2 by RT PCR NEGATIVE NEGATIVE Final    Comment: (NOTE) SARS-CoV-2 target nucleic acids are NOT DETECTED.  The SARS-CoV-2 RNA is generally detectable in upper respiratory specimens during the acute phase of infection. The lowest concentration of SARS-CoV-2 viral copies this assay can detect is 138 copies/mL. A negative result does not preclude SARS-Cov-2 infection and should not be used as the sole basis for treatment or other patient management decisions. A negative result [place]junkins occur with  improper specimen collection/handling, submission of specimen other than nasopharyngeal swab, presence of viral mutation(s) within the areas targeted by this assay, and inadequate number of viral copies(<138 copies/mL). A negative result must be combined with clinical observations, patient history, and epidemiological information. The expected result is Negative.  Fact Sheet for Patients:  bloggercourse.com  Fact Sheet for Healthcare Providers:  seriousbroker.it  This test is no t yet approved or cleared by the [place]  FDA and  has  been authorized for detection and/or diagnosis of SARS-CoV-2 by FDA under an Emergency Use Authorization (EUA). This EUA will remain  in effect (meaning this test can be used) for the duration of the COVID-19 declaration under Section 564(b)(1) of the Act, 21 U.S.C.section 360bbb-3(b)(1), unless the authorization is terminated  or revoked sooner.       Influenza A by PCR NEGATIVE NEGATIVE Final   Influenza B by PCR NEGATIVE NEGATIVE Final    Comment: (NOTE) The Xpert Xpress SARS-CoV-2/FLU/RSV plus assay is intended as an aid in the diagnosis of influenza from Nasopharyngeal swab specimens and should  not be used as a sole basis for treatment. Nasal washings and aspirates are unacceptable for Xpert Xpress SARS-CoV-2/FLU/RSV testing.  Fact Sheet for Patients: bloggercourse.com  Fact Sheet for Healthcare Providers: seriousbroker.it  This test is not yet approved or cleared by the [place]  FDA and has been authorized for detection and/or diagnosis of SARS-CoV-2 by FDA under an Emergency Use Authorization (EUA). This EUA will remain in effect (meaning this test can be used) for the duration of the COVID-19 declaration under Section 564(b)(1) of the Act, 21 U.S.C. section 360bbb-3(b)(1), unless the authorization is terminated or revoked.     Resp Syncytial Virus by PCR NEGATIVE NEGATIVE Final    Comment: (NOTE) Fact Sheet for Patients: bloggercourse.com  Fact Sheet for Healthcare Providers: seriousbroker.it  This test is not yet approved or cleared by the [place]  FDA and has been authorized for detection and/or diagnosis of SARS-CoV-2 by FDA under an Emergency Use Authorization (EUA). This EUA will remain in effect (meaning this test can be used) for the duration of the COVID-19 declaration under Section 564(b)(1) of the Act, 21 U.S.C. section 360bbb-3(b)(1), unless the  authorization is terminated or revoked.  Performed at [facility], [address] [place], [place] [address]   Urine Culture     Status: Abnormal   Collection Time: 04/03/24  9:19 PM   Specimen: Urine, Random  Result Value Ref Range Status   Specimen Description   Final    URINE, RANDOM Performed at [facility], [address] [place], [place] [address]    Special Requests   Final    NONE Reflexed from [id number] Performed at [facility], [address] [place], [place] [address]    Culture MULTIPLE SPECIES PRESENT, SUGGEST RECOLLECTION (A)  Final   Report Status 04/05/2024 FINAL  Final   Imaging ECHOCARDIOGRAM COMPLETE Result Date: 04/04/2024    ECHOCARDIOGRAM REPORT   Patient Name:   JUANELL SAFFO Mall Date of Exam: 04/04/2024 Medical Rec #:  [id number]     Height:       61.0 in Accession #:    [id number]    Weight:       189.0 lb Date of Birth:  Oct 30, 1965     BSA:          1.844 m Patient Age:    58 years      BP:           132/67 mmHg Patient Gender: F             HR:           103 bpm. Exam Location:  [facility] Procedure: 2D Echo, Cardiac Doppler and Color Doppler (Both Spectral and Color            Flow Doppler were utilized during procedure). Indications:     Atrial Fibrillation I48.91  History:         Patient has no prior history of Echocardiogram examinations.                  Risk Factors:Diabetes. Anxiety, migraines.  Sonographer:     Christopher Furnace Referring Phys:  [id number] CARALYN HUDSON Diagnosing Phys: Cara JONETTA Lovelace MD  Sonographer Comments: Technically challenging study due to limited acoustic windows, no apical window and suboptimal subcostal window. IMPRESSIONS  1. Left ventricular ejection fraction, by estimation, is 70 to 75%. The left ventricle has hyperdynamic function. The left ventricle has no regional wall motion abnormalities. There is moderate asymmetric left ventricular  hypertrophy of the septal segment. Left ventricular diastolic function could  not be evaluated.  2. Right ventricular systolic function is normal. The right ventricular size is mildly enlarged. Severely increased right ventricular wall thickness.  3. Left atrial size was mildly dilated.  4. Right atrial size was mildly dilated.  5. The mitral valve is normal in structure. No evidence of mitral valve regurgitation.  6. The aortic valve is normal in structure. Aortic valve regurgitation is not visualized. FINDINGS  Left Ventricle: Left ventricular ejection fraction, by estimation, is 70 to 75%. The left ventricle has hyperdynamic function. The left ventricle has no regional wall motion abnormalities. Strain was performed and the global longitudinal strain is indeterminate. The left ventricular internal cavity size was normal in size. There is moderate asymmetric left ventricular hypertrophy of the septal segment. Left ventricular diastolic function could not be evaluated. Right Ventricle: The right ventricular size is mildly enlarged. Severely increased right ventricular wall thickness. Right ventricular systolic function is normal. Left Atrium: Left atrial size was mildly dilated. Right Atrium: Right atrial size was mildly dilated. Pericardium: Trivial pericardial effusion is present. Mitral Valve: The mitral valve is normal in structure. No evidence of mitral valve regurgitation. Tricuspid Valve: The tricuspid valve is normal in structure. Tricuspid valve regurgitation is trivial. Aortic Valve: The aortic valve is normal in structure. Aortic valve regurgitation is not visualized. Pulmonic Valve: The pulmonic valve was normal in structure. Pulmonic valve regurgitation is not visualized. Aorta: The aortic root was not well visualized. IAS/Shunts: No atrial level shunt detected by color flow Doppler. Additional Comments: 3D was performed not requiring image post processing on an independent workstation and was indeterminate.  LEFT VENTRICLE PLAX 2D LVIDd:         2.89 cm LVIDs:         1.69 cm  LV PW:         1.21 cm LV IVS:        3.66 cm LVOT diam:     2.00 cm LVOT Area:     3.14 cm  LEFT ATRIUM         Index LA diam:    4.30 cm 2.33 cm/m   AORTA Ao Root diam: 3.50 cm  SHUNTS Systemic Diam: 2.00 cm Cara JONETTA Lovelace MD Electronically signed by Cara JONETTA Lovelace MD Signature Date/Time: 04/04/2024/12:31:15 PM    Final       Time coordinating discharge: over 30 minutes  SIGNED:  Laneta Blunt DO [facility]       [1]  Allergies Allergen Reactions   Simvastatin Other (See Comments)    Leg cramps    "

## 2024-04-05 NOTE — Inpatient Diabetes Management (Signed)
 Inpatient Diabetes Program Recommendations  AACE/ADA: New Consensus Statement on Inpatient Glycemic Control   Target Ranges:  Prepandial:   less than 140 mg/dL      Peak postprandial:   less than 180 mg/dL (1-2 hours)      Critically ill patients:  140 - 180 mg/dL    Latest Reference Range & Units 04/04/24 07:41 04/04/24 12:41 04/04/24 17:45 04/04/24 21:28 04/05/24 08:08  Glucose-Capillary 70 - 99 mg/dL 694 (H) 770 (H) 833 (H) 220 (H) 227 (H)   Review of Glycemic Control  Diabetes history: DM2 Outpatient Diabetes medications: Glipizide  5 mg daily, Metformin  500 mg BID, Rybelsus  3 mg daily Current orders for Inpatient glycemic control: Novolog  0-9 units TID with meals, Novolog  0-5 units QHS  Inpatient Diabetes Program Recommendations:    Insulin :CBG ranged from 166-305 mg /dl on 2/5 and 772 mg/dl this morning.  Please consider ordering insulin  glargine 8 units Q24H.  HbgA1C: Please consider ordering an A1C to evaluate glycemic control over the past 2-3 months.  Thanks, Earnie Gainer, RN, MSN, CDCES Diabetes Coordinator Inpatient Diabetes Program [phone] (Team Pager from 8am to 5pm)

## 2024-04-05 NOTE — Progress Notes (Signed)
 Occupational Therapy Treatment Patient Details Name: Adna Nofziger Dain MRN: [id number] DOB: 1965-06-23 Today's Date: 04/05/2024   History of present illness 59 y/o female presented to ED on 04/03/24 for syncope x 3. 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 She has been sitting on EOB and washing self with prior set up from staff. Pt able to perform LB dressing while seated on EOB without assistance. Pt ambulates from bed to bathroom without use of AD for toileting needs with supervision. Toilet transfer,hygiene, clothing management, and hand hygiene performed without physical assistance and pt returned to bed at end of session. OT discussed energy conservation techniques for home and pt verbalized understanding.       If plan is discharge home, recommend the following:  A little help with walking and/or transfers;A little help with bathing/dressing/bathroom;Assist for transportation;Help with stairs or ramp for entrance;Assistance with cooking/housework   Equipment Recommendations  None recommended by OT       Precautions / Restrictions Precautions Precautions: Fall       Mobility Bed Mobility Overal bed mobility: Needs Assistance Bed Mobility: Supine to Sit     Supine to sit: Supervision, HOB elevated Sit to supine: Supervision        Transfers Overall transfer level: Needs assistance Equipment used: None Transfers: Sit to/from Stand Sit to Stand: Supervision                 Balance Overall balance assessment: Needs assistance Sitting-balance support: No upper extremity supported, Feet supported Sitting balance-Leahy Scale: Good     Standing balance support: Bilateral upper extremity supported, During functional activity, Reliant on assistive device for balance Standing balance-Leahy Scale:  Fair                             ADL either performed or assessed with clinical judgement   ADL Overall ADL's : Needs assistance/impaired                     Lower Body Dressing: Supervision/safety;Sit to/from stand   Toilet Transfer: Supervision/safety;Ambulation   Toileting- Clothing Manipulation and Hygiene: Supervision/safety;Sit to/from stand       Functional mobility during ADLs: Supervision/safety      Extremity/Trunk Assessment Upper Extremity Assessment Upper Extremity Assessment: Generalized weakness   Lower Extremity Assessment Lower Extremity Assessment: Generalized weakness        Vision Patient Visual Report: No change from baseline           Communication Communication Communication: No apparent difficulties   Cognition Arousal: Alert Behavior During Therapy: WFL for tasks assessed/performed Cognition: No apparent impairments                               Following commands: Intact        Cueing   Cueing Techniques: Verbal cues        General Comments  (HR 93-98 during gait training, no c/o fatigue)    Pertinent Vitals/ Pain       Pain Assessment Pain Assessment: Faces Faces Pain Scale: Hurts a little bit Pain Location: R LE Pain Descriptors / Indicators: Discomfort Pain Intervention(s): Monitored during session         Frequency  Min 2X/week  Progress Toward Goals  OT Goals(current goals can now be found in the care plan section)  Progress towards OT goals: Progressing toward goals      AM-PAC OT 6 Clicks Daily Activity     Outcome Measure   Help from another person eating meals?: None Help from another person taking care of personal grooming?: None Help from another person toileting, which includes using toliet, bedpan, or urinal?: A Little Help from another person bathing (including washing, rinsing, drying)?: A Little Help from another person to put on and taking off regular  upper body clothing?: None Help from another person to put on and taking off regular lower body clothing?: A Little 6 Click Score: 21    End of Session    OT Visit Diagnosis: Unsteadiness on feet (R26.81);Repeated falls (R29.6);Muscle weakness (generalized) (M62.81)   Activity Tolerance Patient tolerated treatment well   Patient Left in bed;with call bell/phone within reach;with bed alarm set   Nurse Communication Mobility status        Time: 8565-8552 OT Time Calculation (min): 13 min  Charges: OT General Charges $OT Visit: 1 Visit OT Treatments $Self Care/Home Management : 8-22 mins  Izetta Claude, MS, OTR/L , CBIS ascom [phone]  04/05/24, 3:18 PM

## 2024-04-05 NOTE — Progress Notes (Signed)
 Physical Therapy Treatment Patient Details Name: Pamela Torres MRN: [id number] DOB: Sep 13, 1965 Today's Date: 04/05/2024   History of Present Illness 59 y/o female presented to ED on 04/03/24 for syncope x 3. 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 Pt tolerated 180ft without symptoms of dizziness or feeling fuzzy. Pt appears to be at her functional baseline. Educated on reducing falls at home with good understanding. Pt awaiting return home this pm   If plan is discharge home, recommend the following: A little help with walking and/or transfers;A little help with bathing/dressing/bathroom;Assistance with cooking/housework;Help with stairs or ramp for entrance;Assist for transportation   Can travel by private vehicle        Equipment Recommendations  Other (comment) (Pt has a RW, Rollator, and w/c at home)    Recommendations for Other Services       Precautions / Restrictions Precautions Precautions: Fall Recall of Precautions/Restrictions: Intact Precaution/Restrictions Comments:  (Hx of falls) Restrictions Weight Bearing Restrictions Per Provider Order: No     Mobility  Bed Mobility Overal bed mobility: Needs Assistance Bed Mobility: Supine to Sit     Supine to sit: Supervision, HOB elevated     General bed mobility comments:  (No c/o dizziness sitting EOB)    Transfers Overall transfer level: Needs assistance Equipment used: Rolling walker (2 wheels), None Transfers: Sit to/from Stand Sit to Stand: Supervision           General transfer comment:  (Able to stand on first attempt w/o AD)    Ambulation/Gait Ambulation/Gait assistance: Supervision Gait Distance (Feet):  (160) Assistive device: Rolling walker (2 wheels) Gait Pattern/deviations: Step-through pattern,  Decreased step length - right, Decreased step length - left, Wide base of support Gait velocity: decr     General Gait Details:  (Increased gait distance compared to previous day, no c/o dizziness)   Stairs             Wheelchair Mobility     Tilt Bed    Modified Rankin (Stroke Patients Only)       Balance Overall balance assessment: Needs assistance Sitting-balance support: No upper extremity supported, Feet supported Sitting balance-Leahy Scale: Good     Standing balance support: Bilateral upper extremity supported, During functional activity, Reliant on assistive device for balance Standing balance-Leahy Scale: Fair Standing balance comment:  (Hx of falls, uses device for safety)                            Communication Communication Communication: No apparent difficulties  Cognition Arousal: Alert Behavior During Therapy: WFL for tasks assessed/performed   PT - Cognitive impairments: No apparent impairments                         Following commands: Intact      Cueing Cueing Techniques: Verbal cues  Exercises      General Comments General comments (skin integrity, edema, etc.):  (HR 93-98 during gait training, no c/o fatigue)      Pertinent Vitals/Pain Pain Assessment Pain Assessment: Faces Faces Pain Scale: Hurts a little bit Pain Location:  (Right lower leg) Pain Descriptors / Indicators: Discomfort Pain Intervention(s): Monitored during session    Home Living  Prior Function            PT Goals (current goals can now be found in the care plan section) Acute Rehab PT Goals Patient Stated Goal: to go home Progress towards PT goals: Progressing toward goals    Frequency    Min 2X/week      PT Plan      Co-evaluation              AM-PAC PT 6 Clicks Mobility   Outcome Measure  Help needed turning from your back to your side while in a flat bed without using  bedrails?: A Little Help needed moving from lying on your back to sitting on the side of a flat bed without using bedrails?: A Little Help needed moving to and from a bed to a chair (including a wheelchair)?: A Little Help needed standing up from a chair using your arms (e.g., wheelchair or bedside chair)?: A Little Help needed to walk in hospital room?: A Little Help needed climbing 3-5 steps with a railing? : A Little 6 Click Score: 18    End of Session   Activity Tolerance: Patient tolerated treatment well Patient left: in chair;with call bell/phone within reach Nurse Communication: Mobility status PT Visit Diagnosis: Muscle weakness (generalized) (M62.81);History of falling (Z91.81);Unsteadiness on feet (R26.81)     Time: 8596-8579 PT Time Calculation (min) (ACUTE ONLY): 17 min  Charges:    $Therapeutic Activity: 8-22 mins PT General Charges $$ ACUTE PT VISIT: 1 Visit                    [name], PTA  [name] 04/05/2024, 4:12 PM
